# Patient Record
Sex: Female | Born: 1935 | Race: White | Hispanic: No | Smoking: Former smoker
Health system: Southern US, Community
[De-identification: ages and names within clinical notes are randomized; demographics above are authoritative.]

## PROBLEM LIST (undated history)

## (undated) DIAGNOSIS — I4891 Unspecified atrial fibrillation: Secondary | ICD-10-CM

## (undated) DIAGNOSIS — R42 Dizziness and giddiness: Secondary | ICD-10-CM

## (undated) DIAGNOSIS — R001 Bradycardia, unspecified: Secondary | ICD-10-CM

## (undated) DIAGNOSIS — E039 Hypothyroidism, unspecified: Secondary | ICD-10-CM

## (undated) DIAGNOSIS — K219 Gastro-esophageal reflux disease without esophagitis: Secondary | ICD-10-CM

## (undated) DIAGNOSIS — H3530 Unspecified macular degeneration: Secondary | ICD-10-CM

## (undated) DIAGNOSIS — M1990 Unspecified osteoarthritis, unspecified site: Secondary | ICD-10-CM

## (undated) DIAGNOSIS — K222 Esophageal obstruction: Secondary | ICD-10-CM

## (undated) DIAGNOSIS — D649 Anemia, unspecified: Secondary | ICD-10-CM

## (undated) DIAGNOSIS — N289 Disorder of kidney and ureter, unspecified: Secondary | ICD-10-CM

## (undated) DIAGNOSIS — G250 Essential tremor: Secondary | ICD-10-CM

## (undated) DIAGNOSIS — I10 Essential (primary) hypertension: Secondary | ICD-10-CM

## (undated) DIAGNOSIS — K5790 Diverticulosis of intestine, part unspecified, without perforation or abscess without bleeding: Secondary | ICD-10-CM

## (undated) DIAGNOSIS — I4892 Unspecified atrial flutter: Secondary | ICD-10-CM

## (undated) DIAGNOSIS — N393 Stress incontinence (female) (male): Secondary | ICD-10-CM

## (undated) HISTORY — DX: Bradycardia, unspecified: R00.1

## (undated) HISTORY — DX: Esophageal obstruction: K22.2

## (undated) HISTORY — DX: Unspecified atrial flutter: I48.92

## (undated) HISTORY — DX: Stress incontinence (female) (male): N39.3

## (undated) HISTORY — DX: Gastro-esophageal reflux disease without esophagitis: K21.9

## (undated) HISTORY — DX: Diverticulosis of intestine, part unspecified, without perforation or abscess without bleeding: K57.90

## (undated) HISTORY — DX: Unspecified atrial fibrillation: I48.91

## (undated) HISTORY — DX: Disorder of kidney and ureter, unspecified: N28.9

## (undated) HISTORY — DX: Essential (primary) hypertension: I10

## (undated) HISTORY — DX: Dizziness and giddiness: R42

## (undated) HISTORY — DX: Anemia, unspecified: D64.9

## (undated) HISTORY — DX: Unspecified osteoarthritis, unspecified site: M19.90

## (undated) HISTORY — DX: Hypothyroidism, unspecified: E03.9

## (undated) HISTORY — DX: Unspecified macular degeneration: H35.30

## (undated) HISTORY — PX: PACEMAKER INSERTION: SHX728

## (undated) HISTORY — PX: OTHER SURGICAL HISTORY: SHX169

## (undated) HISTORY — DX: Essential tremor: G25.0

---

## 1997-07-14 ENCOUNTER — Ambulatory Visit (HOSPITAL_COMMUNITY): Admission: RE | Admit: 1997-07-14 | Discharge: 1997-07-14 | Payer: Self-pay | Admitting: Obstetrics & Gynecology

## 1998-06-29 ENCOUNTER — Other Ambulatory Visit: Admission: RE | Admit: 1998-06-29 | Discharge: 1998-06-29 | Payer: Self-pay | Admitting: Internal Medicine

## 1999-04-18 ENCOUNTER — Encounter: Admission: RE | Admit: 1999-04-18 | Discharge: 1999-04-18 | Payer: Self-pay | Admitting: Internal Medicine

## 1999-04-18 ENCOUNTER — Encounter: Payer: Self-pay | Admitting: Internal Medicine

## 1999-05-10 ENCOUNTER — Encounter: Payer: Self-pay | Admitting: Internal Medicine

## 1999-05-10 ENCOUNTER — Inpatient Hospital Stay (HOSPITAL_COMMUNITY): Admission: EM | Admit: 1999-05-10 | Discharge: 1999-05-13 | Payer: Self-pay | Admitting: Emergency Medicine

## 1999-05-11 ENCOUNTER — Encounter: Payer: Self-pay | Admitting: Internal Medicine

## 1999-05-12 ENCOUNTER — Encounter: Payer: Self-pay | Admitting: Internal Medicine

## 1999-07-19 ENCOUNTER — Other Ambulatory Visit: Admission: RE | Admit: 1999-07-19 | Discharge: 1999-07-19 | Payer: Self-pay | Admitting: Internal Medicine

## 1999-07-29 ENCOUNTER — Ambulatory Visit (HOSPITAL_COMMUNITY): Admission: RE | Admit: 1999-07-29 | Discharge: 1999-07-29 | Payer: Self-pay | Admitting: Gastroenterology

## 2000-07-19 ENCOUNTER — Encounter: Payer: Self-pay | Admitting: Internal Medicine

## 2000-07-19 ENCOUNTER — Encounter: Admission: RE | Admit: 2000-07-19 | Discharge: 2000-07-19 | Payer: Self-pay | Admitting: Internal Medicine

## 2001-08-09 ENCOUNTER — Encounter: Payer: Self-pay | Admitting: Internal Medicine

## 2001-08-09 ENCOUNTER — Encounter: Admission: RE | Admit: 2001-08-09 | Discharge: 2001-08-09 | Payer: Self-pay | Admitting: Internal Medicine

## 2001-10-25 ENCOUNTER — Ambulatory Visit (HOSPITAL_COMMUNITY): Admission: RE | Admit: 2001-10-25 | Discharge: 2001-10-25 | Payer: Self-pay | Admitting: Internal Medicine

## 2001-10-28 ENCOUNTER — Encounter: Payer: Self-pay | Admitting: Internal Medicine

## 2001-10-28 ENCOUNTER — Encounter: Admission: RE | Admit: 2001-10-28 | Discharge: 2001-10-28 | Payer: Self-pay | Admitting: Internal Medicine

## 2002-08-11 ENCOUNTER — Encounter: Admission: RE | Admit: 2002-08-11 | Discharge: 2002-08-11 | Payer: Self-pay | Admitting: Internal Medicine

## 2002-08-11 ENCOUNTER — Encounter: Payer: Self-pay | Admitting: Internal Medicine

## 2003-01-09 ENCOUNTER — Other Ambulatory Visit: Admission: RE | Admit: 2003-01-09 | Discharge: 2003-01-09 | Payer: Self-pay | Admitting: Internal Medicine

## 2003-08-21 ENCOUNTER — Encounter: Admission: RE | Admit: 2003-08-21 | Discharge: 2003-08-21 | Payer: Self-pay | Admitting: Internal Medicine

## 2003-12-21 ENCOUNTER — Inpatient Hospital Stay (HOSPITAL_COMMUNITY): Admission: AD | Admit: 2003-12-21 | Discharge: 2003-12-23 | Payer: Self-pay | Admitting: Internal Medicine

## 2003-12-22 ENCOUNTER — Encounter: Payer: Self-pay | Admitting: Cardiology

## 2004-01-15 ENCOUNTER — Inpatient Hospital Stay (HOSPITAL_COMMUNITY): Admission: AD | Admit: 2004-01-15 | Discharge: 2004-01-22 | Payer: Self-pay | Admitting: Internal Medicine

## 2004-02-04 ENCOUNTER — Ambulatory Visit: Payer: Self-pay

## 2004-02-09 ENCOUNTER — Ambulatory Visit: Payer: Self-pay | Admitting: Cardiology

## 2004-02-15 ENCOUNTER — Ambulatory Visit: Payer: Self-pay | Admitting: Cardiology

## 2004-03-24 ENCOUNTER — Ambulatory Visit: Payer: Self-pay | Admitting: Cardiology

## 2004-04-15 ENCOUNTER — Encounter: Admission: RE | Admit: 2004-04-15 | Discharge: 2004-04-15 | Payer: Self-pay | Admitting: Internal Medicine

## 2004-04-21 ENCOUNTER — Ambulatory Visit: Payer: Self-pay | Admitting: Cardiology

## 2004-05-20 ENCOUNTER — Ambulatory Visit: Payer: Self-pay | Admitting: Cardiology

## 2004-05-31 ENCOUNTER — Ambulatory Visit: Payer: Self-pay | Admitting: Cardiology

## 2004-06-14 ENCOUNTER — Ambulatory Visit: Payer: Self-pay | Admitting: Internal Medicine

## 2004-06-14 ENCOUNTER — Ambulatory Visit: Payer: Self-pay

## 2004-06-23 ENCOUNTER — Ambulatory Visit: Payer: Self-pay | Admitting: Cardiology

## 2004-06-29 ENCOUNTER — Ambulatory Visit: Payer: Self-pay | Admitting: Cardiovascular Disease

## 2004-06-29 ENCOUNTER — Inpatient Hospital Stay (HOSPITAL_COMMUNITY): Admission: EM | Admit: 2004-06-29 | Discharge: 2004-07-03 | Payer: Self-pay | Admitting: Emergency Medicine

## 2004-06-29 ENCOUNTER — Encounter: Payer: Self-pay | Admitting: Cardiology

## 2004-07-05 ENCOUNTER — Ambulatory Visit: Payer: Self-pay | Admitting: Cardiology

## 2004-07-13 ENCOUNTER — Ambulatory Visit: Payer: Self-pay | Admitting: Internal Medicine

## 2004-08-31 ENCOUNTER — Ambulatory Visit: Payer: Self-pay | Admitting: Cardiology

## 2004-08-31 ENCOUNTER — Ambulatory Visit: Payer: Self-pay | Admitting: Internal Medicine

## 2004-09-01 ENCOUNTER — Ambulatory Visit: Payer: Self-pay | Admitting: Internal Medicine

## 2004-09-05 ENCOUNTER — Encounter: Admission: RE | Admit: 2004-09-05 | Discharge: 2004-09-05 | Payer: Self-pay | Admitting: Internal Medicine

## 2004-09-06 ENCOUNTER — Ambulatory Visit: Payer: Self-pay | Admitting: Cardiology

## 2004-09-07 ENCOUNTER — Ambulatory Visit: Admission: RE | Admit: 2004-09-07 | Discharge: 2004-09-07 | Payer: Self-pay | Admitting: Cardiology

## 2004-09-14 ENCOUNTER — Ambulatory Visit: Payer: Self-pay | Admitting: *Deleted

## 2004-10-03 ENCOUNTER — Ambulatory Visit: Payer: Self-pay | Admitting: Cardiology

## 2004-10-12 ENCOUNTER — Ambulatory Visit: Payer: Self-pay | Admitting: Cardiology

## 2004-10-26 ENCOUNTER — Ambulatory Visit: Payer: Self-pay | Admitting: Cardiology

## 2004-11-08 ENCOUNTER — Ambulatory Visit: Payer: Self-pay | Admitting: Cardiology

## 2004-12-06 ENCOUNTER — Ambulatory Visit: Payer: Self-pay | Admitting: Cardiology

## 2005-01-03 ENCOUNTER — Ambulatory Visit: Payer: Self-pay | Admitting: Internal Medicine

## 2005-01-17 ENCOUNTER — Ambulatory Visit: Payer: Self-pay | Admitting: Cardiology

## 2005-01-31 ENCOUNTER — Ambulatory Visit: Payer: Self-pay | Admitting: Cardiology

## 2005-02-17 ENCOUNTER — Ambulatory Visit: Payer: Self-pay | Admitting: Cardiology

## 2005-02-17 ENCOUNTER — Ambulatory Visit: Payer: Self-pay | Admitting: Internal Medicine

## 2005-03-06 ENCOUNTER — Ambulatory Visit: Payer: Self-pay | Admitting: Cardiology

## 2005-03-07 ENCOUNTER — Encounter: Admission: RE | Admit: 2005-03-07 | Discharge: 2005-03-07 | Payer: Self-pay | Admitting: Internal Medicine

## 2005-04-20 IMAGING — CR DG CHEST 1V PORT
1 series · 1 of 1 positions shown · non-contrast
Comparison: none

CLINICAL DATA: Followup pneumothorax following left chest tube placement.  Previous pacemaker placement. 
PORTABLE CHEST ([DATE] HOURS)
Compared to a prior study at [DATE] hours, there has been placement of a left-sided chest tube.  A small residual approximately 10 percent left apical pneumothorax is seen which has significantly decreased in size since prior study. 
Lungs otherwise grossly clear.  Heart size and mediastinal contours are within normal limits.  Dual lead transvenous pacemaker remains in appropriate position. 
IMPRESSION
Small approximately 10 percent left apical pneumothorax remaining after left chest tube placement.

[view not recorded]
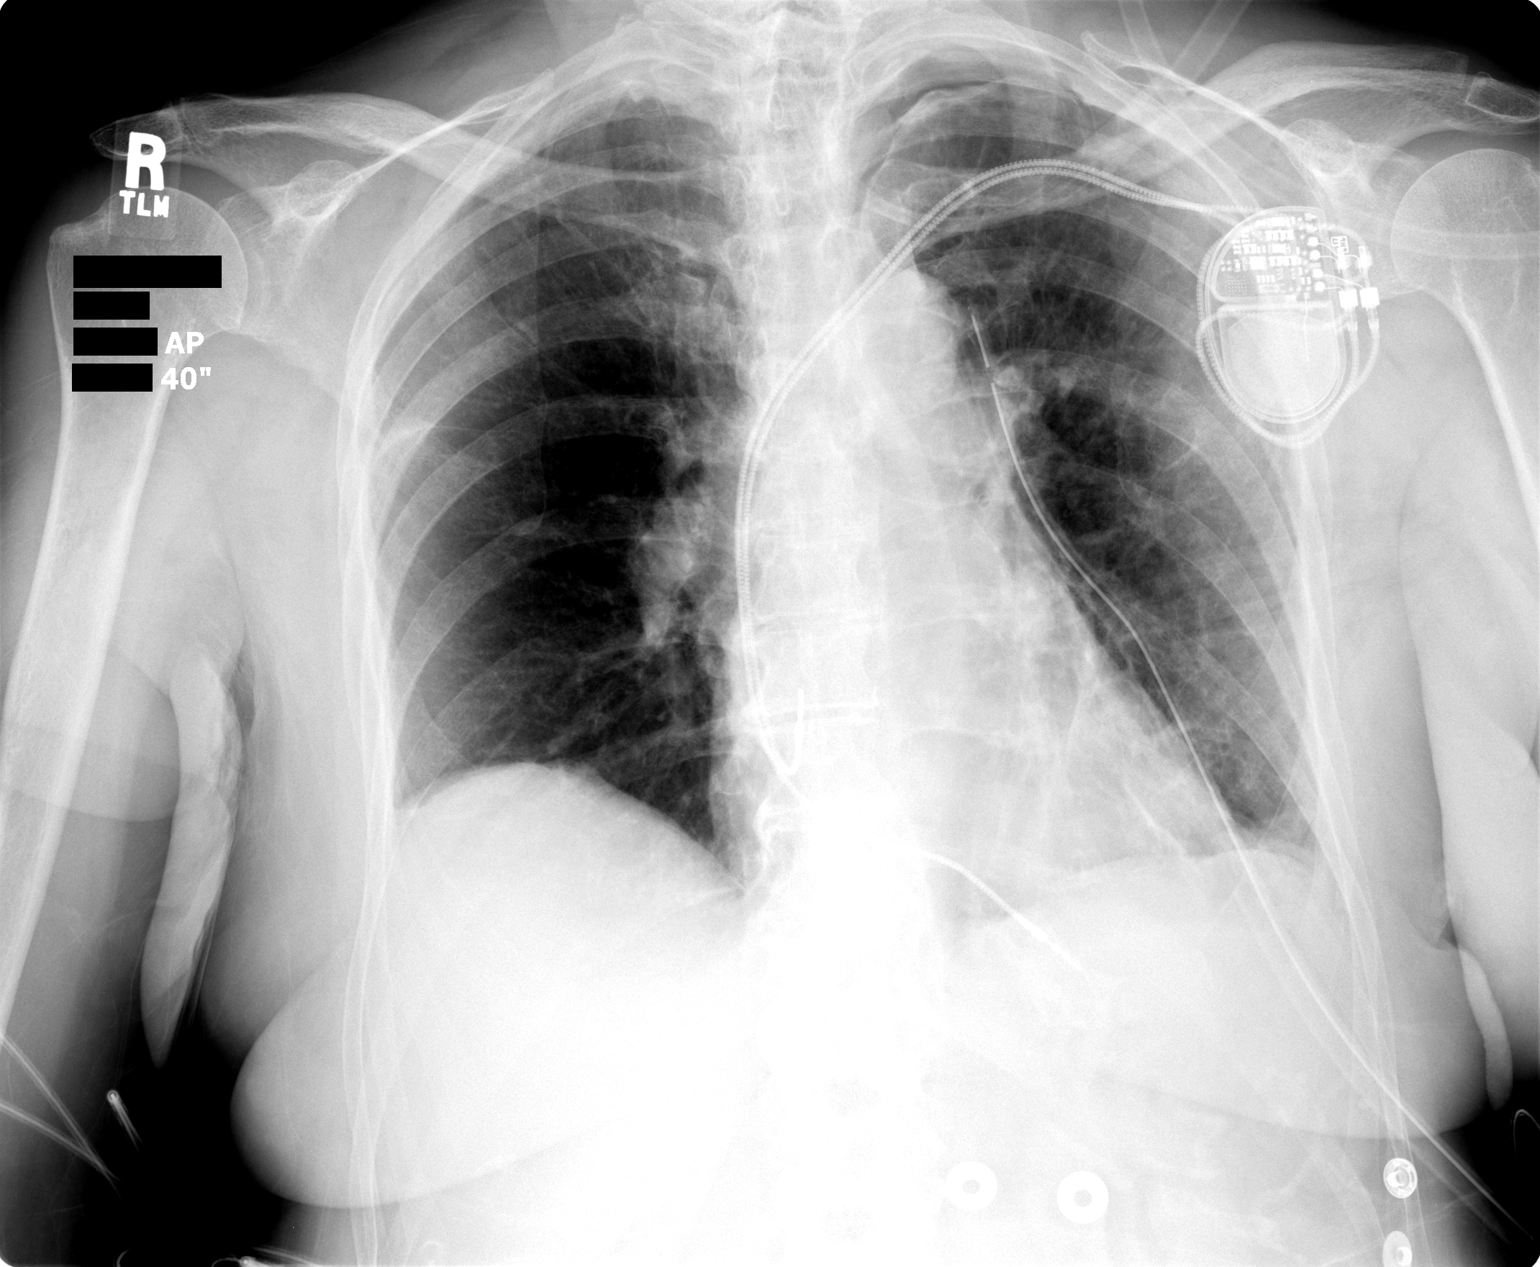

[1 of 1 positions shown; findings below may reference images not displayed]

## 2005-04-21 ENCOUNTER — Ambulatory Visit: Payer: Self-pay | Admitting: Cardiology

## 2005-05-12 ENCOUNTER — Ambulatory Visit: Payer: Self-pay | Admitting: Internal Medicine

## 2005-06-09 ENCOUNTER — Ambulatory Visit: Payer: Self-pay | Admitting: Internal Medicine

## 2005-07-06 ENCOUNTER — Ambulatory Visit: Payer: Self-pay | Admitting: Cardiology

## 2005-08-03 ENCOUNTER — Ambulatory Visit: Payer: Self-pay | Admitting: Cardiology

## 2005-08-31 ENCOUNTER — Ambulatory Visit: Payer: Self-pay | Admitting: Cardiology

## 2005-09-15 ENCOUNTER — Encounter: Admission: RE | Admit: 2005-09-15 | Discharge: 2005-09-15 | Payer: Self-pay | Admitting: Internal Medicine

## 2005-09-28 ENCOUNTER — Ambulatory Visit: Payer: Self-pay | Admitting: Internal Medicine

## 2005-09-28 ENCOUNTER — Other Ambulatory Visit: Admission: RE | Admit: 2005-09-28 | Discharge: 2005-09-28 | Payer: Self-pay | Admitting: Internal Medicine

## 2005-10-19 ENCOUNTER — Ambulatory Visit: Payer: Self-pay | Admitting: Cardiology

## 2005-10-31 ENCOUNTER — Ambulatory Visit: Payer: Self-pay | Admitting: Cardiology

## 2005-11-14 ENCOUNTER — Ambulatory Visit: Payer: Self-pay | Admitting: Cardiology

## 2005-11-28 ENCOUNTER — Ambulatory Visit: Payer: Self-pay | Admitting: *Deleted

## 2005-12-15 ENCOUNTER — Ambulatory Visit: Payer: Self-pay | Admitting: Cardiology

## 2006-01-05 ENCOUNTER — Ambulatory Visit: Payer: Self-pay | Admitting: Cardiovascular Disease

## 2006-02-02 ENCOUNTER — Ambulatory Visit: Payer: Self-pay | Admitting: Cardiology

## 2006-03-02 ENCOUNTER — Ambulatory Visit: Payer: Self-pay | Admitting: Physician Assistant

## 2006-03-12 ENCOUNTER — Ambulatory Visit: Payer: Self-pay | Admitting: Cardiology

## 2006-03-16 ENCOUNTER — Ambulatory Visit: Admission: RE | Admit: 2006-03-16 | Discharge: 2006-03-16 | Payer: Self-pay | Admitting: Cardiology

## 2006-03-30 ENCOUNTER — Ambulatory Visit: Payer: Self-pay | Admitting: Cardiology

## 2006-04-26 ENCOUNTER — Ambulatory Visit: Payer: Self-pay | Admitting: Cardiology

## 2006-05-14 ENCOUNTER — Ambulatory Visit: Payer: Self-pay

## 2006-05-18 ENCOUNTER — Ambulatory Visit: Payer: Self-pay | Admitting: Cardiology

## 2006-06-14 ENCOUNTER — Ambulatory Visit: Payer: Self-pay | Admitting: Cardiology

## 2006-06-22 ENCOUNTER — Encounter: Admission: RE | Admit: 2006-06-22 | Discharge: 2006-06-22 | Payer: Self-pay | Admitting: Internal Medicine

## 2006-07-12 ENCOUNTER — Ambulatory Visit: Payer: Self-pay | Admitting: Cardiology

## 2006-08-02 ENCOUNTER — Ambulatory Visit: Payer: Self-pay | Admitting: Cardiovascular Disease

## 2006-08-08 ENCOUNTER — Ambulatory Visit: Payer: Self-pay | Admitting: Internal Medicine

## 2006-10-08 ENCOUNTER — Ambulatory Visit: Payer: Self-pay | Admitting: Cardiovascular Disease

## 2006-10-16 ENCOUNTER — Ambulatory Visit: Payer: Self-pay | Admitting: Internal Medicine

## 2006-10-24 ENCOUNTER — Encounter: Admission: RE | Admit: 2006-10-24 | Discharge: 2006-10-24 | Payer: Self-pay | Admitting: Internal Medicine

## 2006-11-05 ENCOUNTER — Ambulatory Visit: Payer: Self-pay | Admitting: Internal Medicine

## 2006-12-07 ENCOUNTER — Ambulatory Visit: Payer: Self-pay | Admitting: Internal Medicine

## 2007-01-04 ENCOUNTER — Ambulatory Visit: Payer: Self-pay | Admitting: Internal Medicine

## 2007-01-16 ENCOUNTER — Ambulatory Visit: Payer: Self-pay | Admitting: Internal Medicine

## 2007-01-31 ENCOUNTER — Ambulatory Visit: Payer: Self-pay | Admitting: Internal Medicine

## 2007-02-21 ENCOUNTER — Ambulatory Visit: Payer: Self-pay | Admitting: Cardiology

## 2007-03-07 ENCOUNTER — Ambulatory Visit: Payer: Self-pay | Admitting: Cardiology

## 2007-03-07 ENCOUNTER — Ambulatory Visit: Payer: Self-pay | Admitting: Cardiovascular Disease

## 2007-03-07 LAB — CONVERTED CEMR LAB
AST: 24 units/L (ref 0–37)
Albumin: 3.5 g/dL (ref 3.5–5.2)
Alkaline Phosphatase: 62 units/L (ref 39–117)
BUN: 28 mg/dL — ABNORMAL HIGH (ref 6–23)
CO2: 26 meq/L (ref 19–32)
Chloride: 105 meq/L (ref 96–112)
Creatinine, Ser: 2.2 mg/dL — ABNORMAL HIGH (ref 0.4–1.2)
Prothrombin Time: 21.6 s — ABNORMAL HIGH (ref 10.9–13.3)
Sodium: 138 meq/L (ref 135–145)
Total Bilirubin: 0.6 mg/dL (ref 0.3–1.2)
Total Protein: 6.2 g/dL (ref 6.0–8.3)

## 2007-03-12 ENCOUNTER — Ambulatory Visit: Payer: Self-pay | Admitting: Internal Medicine

## 2007-03-15 ENCOUNTER — Ambulatory Visit: Payer: Self-pay | Admitting: Cardiology

## 2007-03-26 ENCOUNTER — Ambulatory Visit: Payer: Self-pay | Admitting: Cardiology

## 2007-04-17 ENCOUNTER — Ambulatory Visit: Payer: Self-pay | Admitting: Internal Medicine

## 2007-04-29 ENCOUNTER — Ambulatory Visit: Payer: Self-pay | Admitting: Cardiology

## 2007-05-27 ENCOUNTER — Ambulatory Visit: Payer: Self-pay | Admitting: Cardiology

## 2007-06-25 ENCOUNTER — Ambulatory Visit: Payer: Self-pay | Admitting: Cardiology

## 2007-07-15 ENCOUNTER — Ambulatory Visit: Payer: Self-pay | Admitting: Cardiology

## 2007-07-16 ENCOUNTER — Ambulatory Visit: Payer: Self-pay | Admitting: Cardiology

## 2007-07-16 LAB — CONVERTED CEMR LAB: INR: 6.8 (ref 0.8–1.0)

## 2007-07-17 ENCOUNTER — Ambulatory Visit: Payer: Self-pay | Admitting: Internal Medicine

## 2007-07-18 ENCOUNTER — Ambulatory Visit: Payer: Self-pay | Admitting: Cardiology

## 2007-07-22 ENCOUNTER — Ambulatory Visit: Payer: Self-pay | Admitting: Cardiology

## 2007-07-29 ENCOUNTER — Ambulatory Visit: Payer: Self-pay | Admitting: Cardiovascular Disease

## 2007-08-05 ENCOUNTER — Ambulatory Visit: Payer: Self-pay | Admitting: Internal Medicine

## 2007-08-22 ENCOUNTER — Ambulatory Visit: Payer: Self-pay | Admitting: Internal Medicine

## 2007-10-11 ENCOUNTER — Ambulatory Visit: Payer: Self-pay | Admitting: Cardiovascular Disease

## 2007-10-25 ENCOUNTER — Ambulatory Visit: Payer: Self-pay | Admitting: Cardiology

## 2007-10-28 ENCOUNTER — Ambulatory Visit: Payer: Self-pay | Admitting: Cardiology

## 2007-10-28 LAB — CONVERTED CEMR LAB
AST: 18 units/L (ref 0–37)
Alkaline Phosphatase: 61 units/L (ref 39–117)
Total Bilirubin: 0.9 mg/dL (ref 0.3–1.2)

## 2007-10-29 ENCOUNTER — Encounter: Admission: RE | Admit: 2007-10-29 | Discharge: 2007-10-29 | Payer: Self-pay | Admitting: Internal Medicine

## 2007-11-15 ENCOUNTER — Ambulatory Visit: Payer: Self-pay | Admitting: Cardiovascular Disease

## 2007-11-26 ENCOUNTER — Ambulatory Visit: Payer: Self-pay | Admitting: Internal Medicine

## 2007-11-29 ENCOUNTER — Ambulatory Visit: Payer: Self-pay | Admitting: Cardiology

## 2007-12-20 ENCOUNTER — Ambulatory Visit: Payer: Self-pay | Admitting: Cardiology

## 2007-12-26 ENCOUNTER — Encounter: Payer: Self-pay | Admitting: Cardiology

## 2008-01-17 ENCOUNTER — Ambulatory Visit: Payer: Self-pay | Admitting: Cardiology

## 2008-02-14 ENCOUNTER — Ambulatory Visit: Payer: Self-pay | Admitting: Internal Medicine

## 2008-02-17 ENCOUNTER — Ambulatory Visit: Payer: Self-pay | Admitting: Internal Medicine

## 2008-02-26 ENCOUNTER — Ambulatory Visit: Payer: Self-pay | Admitting: Internal Medicine

## 2008-03-16 ENCOUNTER — Ambulatory Visit: Payer: Self-pay | Admitting: Cardiology

## 2008-04-13 ENCOUNTER — Ambulatory Visit: Payer: Self-pay | Admitting: Cardiology

## 2008-05-14 ENCOUNTER — Ambulatory Visit: Payer: Self-pay | Admitting: Cardiovascular Disease

## 2008-05-27 ENCOUNTER — Ambulatory Visit: Payer: Self-pay | Admitting: Internal Medicine

## 2008-06-05 ENCOUNTER — Ambulatory Visit: Payer: Self-pay | Admitting: Internal Medicine

## 2008-06-26 ENCOUNTER — Ambulatory Visit: Payer: Self-pay | Admitting: Internal Medicine

## 2008-06-29 ENCOUNTER — Ambulatory Visit: Payer: Self-pay | Admitting: Cardiology

## 2008-07-24 ENCOUNTER — Encounter (INDEPENDENT_AMBULATORY_CARE_PROVIDER_SITE_OTHER): Payer: Self-pay | Admitting: Radiology

## 2008-07-27 ENCOUNTER — Ambulatory Visit: Payer: Self-pay | Admitting: Cardiology

## 2008-08-28 ENCOUNTER — Encounter: Payer: Self-pay | Admitting: Internal Medicine

## 2008-08-28 LAB — CONVERTED CEMR LAB
INR: 2.76
Protime: 20.5

## 2008-09-01 ENCOUNTER — Encounter: Payer: Self-pay | Admitting: *Deleted

## 2008-09-02 ENCOUNTER — Telehealth (INDEPENDENT_AMBULATORY_CARE_PROVIDER_SITE_OTHER): Payer: Self-pay | Admitting: *Deleted

## 2008-09-03 ENCOUNTER — Encounter: Payer: Self-pay | Admitting: Internal Medicine

## 2008-09-04 ENCOUNTER — Ambulatory Visit: Payer: Self-pay | Admitting: Internal Medicine

## 2008-09-17 DIAGNOSIS — K219 Gastro-esophageal reflux disease without esophagitis: Secondary | ICD-10-CM

## 2008-09-17 DIAGNOSIS — J45909 Unspecified asthma, uncomplicated: Secondary | ICD-10-CM | POA: Insufficient documentation

## 2008-09-17 DIAGNOSIS — I4892 Unspecified atrial flutter: Secondary | ICD-10-CM

## 2008-09-17 DIAGNOSIS — I10 Essential (primary) hypertension: Secondary | ICD-10-CM

## 2008-09-18 ENCOUNTER — Encounter: Payer: Self-pay | Admitting: Internal Medicine

## 2008-09-22 ENCOUNTER — Ambulatory Visit: Payer: Self-pay | Admitting: Cardiology

## 2008-09-22 ENCOUNTER — Encounter (INDEPENDENT_AMBULATORY_CARE_PROVIDER_SITE_OTHER): Payer: Self-pay | Admitting: Pharmacist

## 2008-09-22 LAB — CONVERTED CEMR LAB
POC INR: 1.4
Prothrombin Time: 14.4 s

## 2008-10-06 ENCOUNTER — Encounter (INDEPENDENT_AMBULATORY_CARE_PROVIDER_SITE_OTHER): Payer: Self-pay | Admitting: Cardiology

## 2008-10-06 ENCOUNTER — Ambulatory Visit: Payer: Self-pay | Admitting: Cardiology

## 2008-10-06 LAB — CONVERTED CEMR LAB
POC INR: 2.7
Prothrombin Time: 19.9 s

## 2008-10-07 ENCOUNTER — Encounter: Payer: Self-pay | Admitting: *Deleted

## 2008-10-12 ENCOUNTER — Ambulatory Visit: Payer: Self-pay | Admitting: Internal Medicine

## 2008-10-27 ENCOUNTER — Ambulatory Visit: Payer: Self-pay | Admitting: Internal Medicine

## 2008-10-29 ENCOUNTER — Encounter: Admission: RE | Admit: 2008-10-29 | Discharge: 2008-10-29 | Payer: Self-pay | Admitting: Internal Medicine

## 2008-11-04 ENCOUNTER — Ambulatory Visit: Payer: Self-pay | Admitting: Internal Medicine

## 2008-11-09 ENCOUNTER — Ambulatory Visit: Payer: Self-pay | Admitting: Internal Medicine

## 2008-11-10 ENCOUNTER — Encounter: Admission: RE | Admit: 2008-11-10 | Discharge: 2008-11-10 | Payer: Self-pay | Admitting: Internal Medicine

## 2008-11-10 ENCOUNTER — Ambulatory Visit: Payer: Self-pay | Admitting: Internal Medicine

## 2008-11-24 ENCOUNTER — Encounter (INDEPENDENT_AMBULATORY_CARE_PROVIDER_SITE_OTHER): Payer: Self-pay | Admitting: Cardiology

## 2008-11-24 ENCOUNTER — Ambulatory Visit: Payer: Self-pay | Admitting: Internal Medicine

## 2008-12-15 ENCOUNTER — Ambulatory Visit: Payer: Self-pay | Admitting: Internal Medicine

## 2008-12-15 DIAGNOSIS — Z950 Presence of cardiac pacemaker: Secondary | ICD-10-CM

## 2008-12-15 DIAGNOSIS — I498 Other specified cardiac arrhythmias: Secondary | ICD-10-CM

## 2008-12-30 ENCOUNTER — Ambulatory Visit: Payer: Self-pay | Admitting: Cardiovascular Disease

## 2009-01-08 ENCOUNTER — Ambulatory Visit: Payer: Self-pay | Admitting: Internal Medicine

## 2009-01-08 LAB — CONVERTED CEMR LAB: POC INR: 2.7

## 2009-01-14 ENCOUNTER — Ambulatory Visit: Payer: Self-pay | Admitting: Internal Medicine

## 2009-01-14 ENCOUNTER — Ambulatory Visit: Payer: Self-pay | Admitting: Cardiology

## 2009-01-14 LAB — CONVERTED CEMR LAB: POC INR: 3.2

## 2009-02-02 ENCOUNTER — Encounter: Payer: Self-pay | Admitting: Internal Medicine

## 2009-02-02 ENCOUNTER — Encounter: Payer: Self-pay | Admitting: *Deleted

## 2009-02-02 LAB — CONVERTED CEMR LAB
POC INR: 3.11
Prothrombin Time: 21.9 s

## 2009-02-24 ENCOUNTER — Encounter: Payer: Self-pay | Admitting: Internal Medicine

## 2009-02-24 ENCOUNTER — Encounter (INDEPENDENT_AMBULATORY_CARE_PROVIDER_SITE_OTHER): Payer: Self-pay | Admitting: Pharmacist

## 2009-02-24 LAB — CONVERTED CEMR LAB: POC INR: 2.64

## 2009-03-17 ENCOUNTER — Encounter: Payer: Self-pay | Admitting: Cardiology

## 2009-03-17 ENCOUNTER — Encounter (INDEPENDENT_AMBULATORY_CARE_PROVIDER_SITE_OTHER): Payer: Self-pay | Admitting: Cardiology

## 2009-03-18 ENCOUNTER — Encounter: Payer: Self-pay | Admitting: Internal Medicine

## 2009-03-23 ENCOUNTER — Ambulatory Visit: Payer: Self-pay | Admitting: Internal Medicine

## 2009-03-23 ENCOUNTER — Encounter: Payer: Self-pay | Admitting: Internal Medicine

## 2009-04-06 ENCOUNTER — Encounter: Payer: Self-pay | Admitting: Internal Medicine

## 2009-04-16 ENCOUNTER — Ambulatory Visit: Payer: Self-pay | Admitting: Internal Medicine

## 2009-04-21 ENCOUNTER — Ambulatory Visit: Payer: Self-pay | Admitting: Cardiovascular Disease

## 2009-04-21 LAB — CONVERTED CEMR LAB: POC INR: 2

## 2009-05-19 ENCOUNTER — Ambulatory Visit: Payer: Self-pay | Admitting: Internal Medicine

## 2009-06-04 ENCOUNTER — Ambulatory Visit: Payer: Self-pay | Admitting: Cardiology

## 2009-06-18 ENCOUNTER — Ambulatory Visit: Payer: Self-pay | Admitting: Cardiovascular Disease

## 2009-06-18 LAB — CONVERTED CEMR LAB: POC INR: 3.4

## 2009-06-21 ENCOUNTER — Ambulatory Visit: Payer: Self-pay | Admitting: Internal Medicine

## 2009-06-23 ENCOUNTER — Encounter: Payer: Self-pay | Admitting: Internal Medicine

## 2009-06-23 ENCOUNTER — Ambulatory Visit: Payer: Self-pay | Admitting: Internal Medicine

## 2009-07-02 ENCOUNTER — Ambulatory Visit: Payer: Self-pay | Admitting: Internal Medicine

## 2009-07-02 LAB — CONVERTED CEMR LAB: POC INR: 3.1

## 2009-07-12 ENCOUNTER — Encounter: Payer: Self-pay | Admitting: Internal Medicine

## 2009-07-19 ENCOUNTER — Ambulatory Visit: Payer: Self-pay | Admitting: Cardiology

## 2009-08-16 ENCOUNTER — Ambulatory Visit: Payer: Self-pay | Admitting: Cardiovascular Disease

## 2009-08-16 LAB — CONVERTED CEMR LAB: POC INR: 3.5

## 2009-09-01 ENCOUNTER — Ambulatory Visit: Payer: Self-pay | Admitting: Cardiology

## 2009-09-01 LAB — CONVERTED CEMR LAB: POC INR: 2.3

## 2009-09-21 ENCOUNTER — Encounter: Payer: Self-pay | Admitting: Internal Medicine

## 2009-09-23 ENCOUNTER — Encounter: Payer: Self-pay | Admitting: Cardiology

## 2009-10-06 ENCOUNTER — Ambulatory Visit: Payer: Self-pay | Admitting: Internal Medicine

## 2009-10-07 ENCOUNTER — Ambulatory Visit: Payer: Self-pay | Admitting: Cardiology

## 2009-10-12 ENCOUNTER — Encounter: Payer: Self-pay | Admitting: Internal Medicine

## 2009-10-15 ENCOUNTER — Ambulatory Visit: Payer: Self-pay | Admitting: Internal Medicine

## 2009-10-15 ENCOUNTER — Encounter: Payer: Self-pay | Admitting: Cardiology

## 2009-10-19 ENCOUNTER — Ambulatory Visit: Payer: Self-pay | Admitting: Internal Medicine

## 2009-10-21 ENCOUNTER — Ambulatory Visit: Payer: Self-pay | Admitting: Cardiology

## 2009-10-21 DIAGNOSIS — I482 Chronic atrial fibrillation, unspecified: Secondary | ICD-10-CM

## 2009-10-27 LAB — CONVERTED CEMR LAB
ALT: 13 units/L (ref 0–35)
AST: 18 units/L (ref 0–37)
TSH: 1.04 microintl units/mL (ref 0.35–5.50)
Total Bilirubin: 0.6 mg/dL (ref 0.3–1.2)

## 2009-11-01 ENCOUNTER — Ambulatory Visit: Payer: Self-pay | Admitting: Cardiovascular Disease

## 2009-11-01 ENCOUNTER — Encounter: Admission: RE | Admit: 2009-11-01 | Discharge: 2009-11-01 | Payer: Self-pay | Admitting: Internal Medicine

## 2009-11-29 ENCOUNTER — Ambulatory Visit: Payer: Self-pay | Admitting: Cardiology

## 2009-12-27 ENCOUNTER — Ambulatory Visit: Payer: Self-pay | Admitting: Internal Medicine

## 2009-12-30 ENCOUNTER — Encounter: Admission: RE | Admit: 2009-12-30 | Discharge: 2009-12-30 | Payer: Self-pay | Admitting: Internal Medicine

## 2009-12-30 ENCOUNTER — Ambulatory Visit: Payer: Self-pay | Admitting: Cardiology

## 2010-01-14 ENCOUNTER — Ambulatory Visit: Payer: Self-pay | Admitting: Cardiology

## 2010-01-14 LAB — CONVERTED CEMR LAB: POC INR: 2

## 2010-02-11 ENCOUNTER — Encounter: Payer: Self-pay | Admitting: Cardiology

## 2010-02-11 LAB — CONVERTED CEMR LAB: POC INR: 2.2

## 2010-03-10 ENCOUNTER — Encounter: Payer: Self-pay | Admitting: Internal Medicine

## 2010-03-10 LAB — CONVERTED CEMR LAB: Prothrombin Time: 19 s

## 2010-03-11 ENCOUNTER — Encounter: Payer: Self-pay | Admitting: Internal Medicine

## 2010-03-24 ENCOUNTER — Ambulatory Visit: Payer: Self-pay | Admitting: Cardiology

## 2010-03-24 LAB — CONVERTED CEMR LAB: POC INR: 2.2

## 2010-04-06 ENCOUNTER — Ambulatory Visit
Admission: RE | Admit: 2010-04-06 | Discharge: 2010-04-06 | Payer: Self-pay | Source: Home / Self Care | Attending: Internal Medicine | Admitting: Internal Medicine

## 2010-04-06 ENCOUNTER — Encounter: Payer: Self-pay | Admitting: Internal Medicine

## 2010-04-21 ENCOUNTER — Ambulatory Visit
Admission: RE | Admit: 2010-04-21 | Discharge: 2010-04-21 | Payer: Self-pay | Source: Home / Self Care | Attending: Internal Medicine | Admitting: Internal Medicine

## 2010-04-21 ENCOUNTER — Ambulatory Visit: Admission: RE | Admit: 2010-04-21 | Discharge: 2010-04-21 | Payer: Self-pay | Source: Home / Self Care

## 2010-04-25 ENCOUNTER — Encounter: Payer: Self-pay | Admitting: Internal Medicine

## 2010-05-05 NOTE — Cardiovascular Report (Signed)
Summary: Office Visit   Office Visit   Imported By: Roderic Ovens 04/08/2010 12:35:34  _____________________________________________________________________  External Attachment:    Type:   Image     Comment:   External Document

## 2010-05-05 NOTE — Cardiovascular Report (Signed)
Summary: Office Visit Remote  Office Visit Remote   Imported By: Roderic Ovens 07/13/2009 12:38:46  _____________________________________________________________________  External Attachment:    Type:   Image     Comment:   External Document

## 2010-05-05 NOTE — Letter (Signed)
Summary: Remote Device Check  [employer], Main Office  [address]   [place], [place] [address]   Phone: [phone]  Fax: [phone]     July 12, 2009 MRN: [id number]   [name] [address] [place], [place]  [address]   Dear [name],   Your remote transmission was recieved and reviewed by your physician.  All diagnostics were within normal limits for you.  __X___Your next transmission is scheduled for:  September 29, 2009.  Please transmit at any time this day.  If you have a wireless device your transmission will be sent automatically.      Sincerely,  Proofreader

## 2010-05-05 NOTE — Medication Information (Signed)
Summary: [facility]  Anticoagulant Therapy  Managed by: Bethena Midget, RN, BSN Referring MD: Rollene Rotunda MD PCP: Sharlet Salina, MD Supervising MD: Shirlee Latch MD, Albie Bazin Indication 1: Atrial Fibrillation (ICD-427.31) Lab Used: [facility] Site: [facility] PT 20.2 INR POC 2.7 INR RANGE 2.0-3.0  Dietary changes: no    Health status changes: no    Bleeding/hemorrhagic complications: no    Recent/future hospitalizations: no    Any changes in medication regimen? no    Recent/future dental: no  Any missed doses?: no       Is patient compliant with meds? yes      Comments: Lab received from 09/21/09  Allergies: No Known Drug Allergies  Anticoagulation Management History:      Her anticoagulation is being managed by telephone today.  Positive risk factors for bleeding include an age of 75 years or older and presence of serious comorbidities.  The bleeding index is 'intermediate risk'.  Positive CHADS2 values include History of HTN and Age > 73 years old.  The start date was 09/17/2002.  Her last INR was 2.76.  Prothrombin time is 20.2.  Anticoagulation responsible provider: Shirlee Latch MD, [name].  INR POC: 2.7.    Anticoagulation Management Assessment/Plan:      The patient's current anticoagulation dose is Warfarin sodium 4 mg tabs: Use as directed by Anticoagulation Clinic.  The target INR is 2.0-3.0.  The next INR is due 10/21/2009.  Anticoagulation instructions were given to patient.  Results were reviewed/authorized by Bethena Midget, RN, BSN.  She was notified by Bethena Midget, RN, BSN.         Prior Anticoagulation Instructions: INR-2.3 Resume normal dosing schedule.Take 1 tablet on Monday, Wednesday and Friday and take half a tablet on all other days. Patient on vacation in [place] form early June to July will have INR checked in [place] on 09/22/09, issued her with paperwork.  Current Anticoagulation Instructions: INR 2.7 Continue 2mg s everyday except 4mg s on Mondays,  Wednesdays and Fridays. Recheck in 4 weeks. Pt will be back in [place], appt s/c at office.

## 2010-05-05 NOTE — Medication Information (Signed)
Summary: cvrr   rov     js  Anticoagulant Therapy  Managed by: Cloyde Reams, RN, BSN Referring MD: Rollene Rotunda MD PCP: Sharlet Salina, MD Supervising MD: Clifton James MD, Cristal Deer Indication 1: Atrial Fibrillation (ICD-427.31) Lab Used: [facility] Site: [facility] INR POC 3.4 INR RANGE 2.0-3.0  Dietary changes: no    Health status changes: no    Bleeding/hemorrhagic complications: no    Recent/future hospitalizations: no    Any changes in medication regimen? no    Recent/future dental: no  Any missed doses?: no       Is patient compliant with meds? yes       Allergies (verified): No Known Drug Allergies  Anticoagulation Management History:      The patient is taking warfarin and comes in today for a routine follow up visit.  Positive risk factors for bleeding include an age of 4 years or older and presence of serious comorbidities.  The bleeding index is 'intermediate risk'.  Positive CHADS2 values include History of HTN and Age > 39 years old.  The start date was 09/17/2002.  Her last INR was 2.76.  Anticoagulation responsible provider: Clifton James MD, Cristal Deer.  INR POC: 3.4.  Cuvette Lot#: [id number].  Exp: 08/2010.    Anticoagulation Management Assessment/Plan:      The patient's current anticoagulation dose is Warfarin sodium 4 mg tabs: Use as directed by Anticoagulation Clinic.  The target INR is 2.0-3.0.  The next INR is due 07/02/2009.  Anticoagulation instructions were given to patient.  Results were reviewed/authorized by Cloyde Reams, RN, BSN.  She was notified by Cloyde Reams RN.         Prior Anticoagulation Instructions: The patient's dosage of coumadin will be increased.  The new dosage includes: Take one and a half tablets (6mg ) today then take 1 tablet (4mg ) daily except half a tablet (2mg ) on Sundays, Tuesdays, and Thursdays.  Current Anticoagulation Instructions: INR 3.4  Take 1/2 tablet today then start taking 1/2 tablet daily except 1 tablet  on Mondays, Wednesdays, and Fridays.  Recheck in 2 weeks.

## 2010-05-05 NOTE — Medication Information (Signed)
Summary: rov/kb  Anticoagulant Therapy  Managed by: Weston Brass, PharmD Referring MD: Rollene Rotunda MD PCP: Sharlet Salina, MD Supervising MD: Riley Kill MD, Maisie Fus Indication 1: Atrial Fibrillation (ICD-427.31) Lab Used: [facility] Site: [facility] INR POC 2.3 INR RANGE 2.0-3.0  Dietary changes: yes       Details: Eating more greens  Health status changes: no    Bleeding/hemorrhagic complications: no    Recent/future hospitalizations: no    Any changes in medication regimen? no    Recent/future dental: no  Any missed doses?: no       Is patient compliant with meds? yes       Allergies: No Known Drug Allergies  Anticoagulation Management History:      The patient is taking warfarin and comes in today for a routine follow up visit.  Positive risk factors for bleeding include an age of 75 years or older and presence of serious comorbidities.  The bleeding index is 'intermediate risk'.  Positive CHADS2 values include History of HTN and Age > 106 years old.  The start date was 09/17/2002.  Her last INR was 2.76.  Anticoagulation responsible provider: Riley Kill MD, Maisie Fus.  INR POC: 2.3.  Cuvette Lot#: [id number].  Exp: 11/2010.    Anticoagulation Management Assessment/Plan:      The patient's current anticoagulation dose is Warfarin sodium 4 mg tabs: Use as directed by Anticoagulation Clinic.  The target INR is 2.0-3.0.  The next INR is due 09/22/2009.  Anticoagulation instructions were given to patient.  Results were reviewed/authorized by Weston Brass, PharmD.  She was notified by [facility].         Prior Anticoagulation Instructions: INR-3.5 Skip dose today and resume normal schedule.  Take 1  tablet Monday Wednesday and Friday and take 1/2 a tablet on all other days of each week. Return in 2 weeks  Current Anticoagulation Instructions: INR-2.3 Resume normal dosing schedule.Take 1 tablet on Monday, Wednesday and Friday and take half a tablet on all other days. Patient on  vacation in [place] form early June to July will have INR checked in [place] on 09/22/09, issued her with paperwork.

## 2010-05-05 NOTE — Cardiovascular Report (Signed)
Summary: Office Visit Remote   Office Visit Remote   Imported By: Roderic Ovens 04/14/2009 14:54:38  _____________________________________________________________________  External Attachment:    Type:   Image     Comment:   External Document

## 2010-05-05 NOTE — Medication Information (Signed)
Summary: rov/tm  Anticoagulant Therapy  Managed by: Weston Brass, PharmD Referring MD: Rollene Rotunda MD PCP: Sharlet Salina, MD Supervising MD: Riley Kill MD, Maisie Fus Indication 1: Atrial Fibrillation (ICD-427.31) Lab Used: [facility] Site: [facility] INR POC 2.0 INR RANGE 2.0-3.0  Dietary changes: no    Health status changes: no    Bleeding/hemorrhagic complications: no    Recent/future hospitalizations: no    Any changes in medication regimen? no    Recent/future dental: no  Any missed doses?: no       Is patient compliant with meds? yes       Allergies: No Known Drug Allergies  Anticoagulation Management History:      The patient is taking warfarin and comes in today for a routine follow up visit.  Positive risk factors for bleeding include an age of 74 years or older and presence of serious comorbidities.  The bleeding index is 'intermediate risk'.  Positive CHADS2 values include History of HTN and Age > 74 years old.  The start date was 09/17/2002.  Her last INR was 2.76.  Anticoagulation responsible Taylyn Brame: Riley Kill MD, Maisie Fus.  INR POC: 2.0.  Cuvette Lot#: [id number].  Exp: 01/2011.    Anticoagulation Management Assessment/Plan:      The patient's current anticoagulation dose is Warfarin sodium 4 mg tabs: Use as directed by Anticoagulation Clinic.  The target INR is 2.0-3.0.  The next INR is due 12/27/2009.  Anticoagulation instructions were given to patient.  Results were reviewed/authorized by Weston Brass, PharmD.  She was notified by Gweneth Fritter, PharmD Candidate.         Prior Anticoagulation Instructions: INR 2.1 Continue 2mg s everyday except 4mg s on Mondays, Wednesdays and Fridays. Recheck in 4 weeks.   Current Anticoagulation Instructions: INR 2.0  Continue taking 1/2 tablet (2mg ) every day except take 1 tablet (4mg ) on Mondays, Wednesdays, and Fridays.  Recheck in 4 week.

## 2010-05-05 NOTE — Letter (Signed)
Summary: [employer], newspaper, Main Office  [address]   [place], [place] [address]   Phone: [phone]  Fax: [phone]     October 06, 2009 MRN: [id number]   [name] [address] [place], [place]  [address]   Dear Ms. BADOUR,  According to our records, you were scheduled for a device phone transmission on  09-29-2009.     We did not receive any results from this check.  If you transmitted on your scheduled day, please call us to help troubleshoot your system.  If you forgot to send your transmission, please send one upon receipt of this letter.  Thank you,   Arch[facility]

## 2010-05-05 NOTE — Medication Information (Signed)
Summary: rov/ewj  Anticoagulant Therapy  Managed by: Elaina Pattee, PharmD Referring MD: Rollene Rotunda MD PCP: Sharlet Salina, MD Supervising MD: Tenny Craw MD, Gunnar Fusi Indication 1: Atrial Fibrillation (ICD-427.31) Lab Used: [facility] Site: [facility] INR POC 3.1 INR RANGE 2.0-3.0  Dietary changes: yes       Details: Yonke have eaten fewer green vegetables.  Health status changes: no    Bleeding/hemorrhagic complications: no    Recent/future hospitalizations: yes       Details: Emerick have skin patch removed.  Aware pt is on Coumadin.  Any changes in medication regimen? yes       Details: Finished Doxycycline 100 mg BID yesterday. Mupirocin ointment to nares for 1 month (DC 4/21).  Recent/future dental: no  Any missed doses?: no       Is patient compliant with meds? yes       Allergies: No Known Drug Allergies  Anticoagulation Management History:      The patient is taking warfarin and comes in today for a routine follow up visit.  Positive risk factors for bleeding include an age of 75 years or older and presence of serious comorbidities.  The bleeding index is 'intermediate risk'.  Positive CHADS2 values include History of HTN and Age > 49 years old.  The start date was 09/17/2002.  Her last INR was 2.76.  Anticoagulation responsible provider: Tenny Craw MD, Gunnar Fusi.  INR POC: 3.1.  Cuvette Lot#: [id number].  Exp: 08/2010.    Anticoagulation Management Assessment/Plan:      The patient's current anticoagulation dose is Warfarin sodium 4 mg tabs: Use as directed by Anticoagulation Clinic.  The target INR is 2.0-3.0.  The next INR is due 07/19/2009.  Anticoagulation instructions were given to patient.  Results were reviewed/authorized by Elaina Pattee, PharmD.  She was notified by Elaina Pattee, PharmD.         Prior Anticoagulation Instructions: INR 3.4  Take 1/2 tablet today then start taking 1/2 tablet daily except 1 tablet on Mondays, Wednesdays, and Fridays.  Recheck in 2 weeks.     Current Anticoagulation Instructions: INR 3.1. Take 0.5 tablet today, then continue taking 0.5 tablet daily except 1 tablet on Mon, Wed, Fri.  Recheck in 2-3 weeks.

## 2010-05-05 NOTE — Medication Information (Signed)
Summary: rov/sp  Anticoagulant Therapy  Managed by: Weston Brass, PharmD Referring MD: Rollene Rotunda MD PCP: Sharlet Salina, MD Supervising MD: Ladona Ridgel MD, Sharlot Gowda Indication 1: Atrial Fibrillation (ICD-427.31) Lab Used: [facility] Site: [facility] INR POC 2.6 INR RANGE 2.0-3.0  Dietary changes: no    Health status changes: no    Bleeding/hemorrhagic complications: no    Recent/future hospitalizations: yes       Details: Tooth extraction yesterday, did not discontinue Coumadin for procedue. No bleeding complications.   Any changes in medication regimen? no    Recent/future dental: no  Any missed doses?: no       Is patient compliant with meds? yes       Allergies: No Known Drug Allergies  Anticoagulation Management History:      The patient is taking warfarin and comes in today for a routine follow up visit.  Positive risk factors for bleeding include an age of 64 years or older and presence of serious comorbidities.  The bleeding index is 'intermediate risk'.  Positive CHADS2 values include History of HTN and Age > 20 years old.  The start date was 09/17/2002.  Her last INR was 2.76.  Anticoagulation responsible provider: Ladona Ridgel MD, Sharlot Gowda.  INR POC: 2.6.  Cuvette Lot#: [id number].  Exp: 04/2011.    Anticoagulation Management Assessment/Plan:      The patient's current anticoagulation dose is Warfarin sodium 4 mg tabs: Use as directed by Anticoagulation Clinic.  The target INR is 2.0-3.0.  The next INR is due 05/19/2010.  Anticoagulation instructions were given to patient.  Results were reviewed/authorized by Weston Brass, PharmD.  She was notified by Stephannie Peters, PharmD Candidate .         Prior Anticoagulation Instructions: INR 2.2  Continue same dose of 1/2 tablet every day except 1 tablet on Monday, Wednesday and Friday.  Recheck INR in 4 weeks.   Current Anticoagulation Instructions: INR 2.6  Coumadin 4 mg tablets - Continue 1/2 tablet every day except 1 tablet on  Mondays, Wednesdays and Fridays

## 2010-05-05 NOTE — Medication Information (Signed)
Summary: rov/cb  Anticoagulant Therapy  Managed by: Cloyde Reams, RN, BSN Referring MD: Rollene Rotunda MD PCP: Sharlet Salina, MD Supervising MD: Juanda Chance MD, Emery Dupuy Indication 1: Atrial Fibrillation (ICD-427.31) Lab Used: [facility] Site: [facility] INR POC 2.7 INR RANGE 2.0-3.0  Dietary changes: no    Health status changes: no    Bleeding/hemorrhagic complications: no    Recent/future hospitalizations: no    Any changes in medication regimen? yes       Details: Continues Muciprocin ointment to nares and Hibiclens.    Recent/future dental: no  Any missed doses?: no       Is patient compliant with meds? yes       Allergies (verified): No Known Drug Allergies  Anticoagulation Management History:      The patient is taking warfarin and comes in today for a routine follow up visit.  Positive risk factors for bleeding include an age of 20 years or older and presence of serious comorbidities.  The bleeding index is 'intermediate risk'.  Positive CHADS2 values include History of HTN and Age > 64 years old.  The start date was 09/17/2002.  Her last INR was 2.76.  Anticoagulation responsible provider: Juanda Chance MD, Smitty Cords.  INR POC: 2.7.  Cuvette Lot#: [id number].  Exp: 08/2010.    Anticoagulation Management Assessment/Plan:      The patient's current anticoagulation dose is Warfarin sodium 4 mg tabs: Use as directed by Anticoagulation Clinic.  The target INR is 2.0-3.0.  The next INR is due 08/16/2009.  Anticoagulation instructions were given to patient.  Results were reviewed/authorized by Cloyde Reams, RN, BSN.  She was notified by Cloyde Reams RN.         Prior Anticoagulation Instructions: INR 3.1. Take 0.5 tablet today, then continue taking 0.5 tablet daily except 1 tablet on Mon, Wed, Fri.  Recheck in 2-3 weeks.  Current Anticoagulation Instructions: INR 2.7  Continue on same dosage 1/2 tablet daily except 1 tablet on Mondays, Wednesdays, and Fridays.  Recheck in 4 weeks.

## 2010-05-05 NOTE — Medication Information (Signed)
Summary: rov coumadin - [facility]  Anticoagulant Therapy  Managed by: Weston Brass, PharmD Referring MD: Rollene Rotunda MD PCP: Sharlet Salina, MD Supervising MD: Riley Kill MD, Maisie Fus Indication 1: Atrial Fibrillation (ICD-427.31) Lab Used: [facility] Site: [facility] INR POC 2.0 INR RANGE 2.0-3.0  Dietary changes: no    Health status changes: no    Bleeding/hemorrhagic complications: no    Recent/future hospitalizations: no    Any changes in medication regimen? no    Recent/future dental: no  Any missed doses?: no       Is patient compliant with meds? yes       Allergies: No Known Drug Allergies  Anticoagulation Management History:      The patient is taking warfarin and comes in today for a routine follow up visit.  Positive risk factors for bleeding include an age of 101 years or older and presence of serious comorbidities.  The bleeding index is 'intermediate risk'.  Positive CHADS2 values include History of HTN and Age > 60 years old.  The start date was 09/17/2002.  Her last INR was 2.76.  Anticoagulation responsible Rumeal Cullipher: Riley Kill MD, Maisie Fus.  INR POC: 2.0.  Cuvette Lot#: [id number].  Exp: 02/2011.    Anticoagulation Management Assessment/Plan:      The patient's current anticoagulation dose is Warfarin sodium 4 mg tabs: Use as directed by Anticoagulation Clinic.  The target INR is 2.0-3.0.  The next INR is due 02/11/2010.  Anticoagulation instructions were given to patient.  Results were reviewed/authorized by Weston Brass, PharmD.  She was notified by Weston Brass PharmD.         Prior Anticoagulation Instructions: INR 2.7  Coumadin 1 tab = 4mg  on Mon, Wed, Fri  1/2 tab = 2mg  on Tue, Thur, Sat and Sun  Current Anticoagulation Instructions: INR 2.0  Continue same dose of 1/2 tablet every day except 1 tablet on Monday, Wednesday and Friday.  Recheck INR in 4 weeks in [place].

## 2010-05-05 NOTE — Medication Information (Signed)
Summary: rov/tm  Anticoagulant Therapy  Managed by: Minerva Areola, RN Referring MD: Rollene Rotunda MD PCP: Sharlet Salina, MD Supervising MD: Juanda Chance MD, Masae Lukacs Indication 1: Atrial Fibrillation (ICD-427.31) Lab [facility]  Site: [facility] INR POC 1.8 INR RANGE 2.0-3.0  Dietary changes: no    Health status changes: no    Bleeding/hemorrhagic complications: no    Recent/future hospitalizations: no    Any changes in medication regimen? no    Recent/future dental: no  Any missed doses?: no       Is patient compliant with meds? yes       Current Medications (verified): 1)  Amiodarone Hcl 200 Mg Tabs (Amiodarone Hcl) .... Daily 2)  Metoprolol Tartrate 50 Mg Tabs (Metoprolol Tartrate) .... Daily 3)  Synthroid 88 Mcg Tabs (Levothyroxine Sodium) .Marland Kitchen.. 1 Tablet Daily 4)  Triamterene-Hctz 37.5-25 Mg Tabs (Triamterene-Hctz) .Marland Kitchen.. 1 Daily 5)  Icaps  Caps (Multiple Vitamins-Minerals) .Marland Kitchen.. 1 Daily 6)  Restasis 0.05 % Emul (Cyclosporine) .Marland Kitchen.. 1 Drop Twice A Daily 7)  Alprazolam 0.5 Mg Tabs (Alprazolam) .Marland Kitchen.. 1 Tablet Daily At Night 8)  Nexium 40 Mg Cpdr (Esomeprazole Magnesium) .Marland Kitchen.. 1 Daily 9)  Metamucil Smooth Texture 63 % Powd (Psyllium) .Marland Kitchen.. 1 Capful Daily 10)  Warfarin Sodium 4 Mg Tabs (Warfarin Sodium) .... Use As Directed By Anticoagulation Clinic 11)  Vitamin D 1000 Unit Tabs (Cholecalciferol) .... Take 1 Tablet By Mouth Once A Day 12)  Ferrous Sulfate 325 (65 Fe) Mg Tabs (Ferrous Sulfate) .... Take 1 Tablet By Mouth Once A Day  Allergies (verified): No Known Drug Allergies  Anticoagulation Management History:      The patient is taking warfarin and comes in today for a routine follow up visit.  Positive risk factors for bleeding include an age of 56 years or older and presence of serious comorbidities.  The bleeding index is 'intermediate risk'.  Positive CHADS2 values include History of HTN and Age > 82 years old.  The start date was 09/17/2002.  Her last INR was  2.76.  Anticoagulation responsible provider: Juanda Chance MD, Smitty Cords.  INR POC: 1.8.  Cuvette Lot#: [id number].  Exp: 08/2010.    Anticoagulation Management Assessment/Plan:      The patient's current anticoagulation dose is Warfarin sodium 4 mg tabs: Use as directed by Anticoagulation Clinic.  The target INR is 2.0-3.0.  The next INR is due 06/18/2009.  Anticoagulation instructions were given to patient.  Results were reviewed/authorized by Minerva Areola, RN.  She was notified by Minerva Areola, RN, BSN.         Prior Anticoagulation Instructions: INR 1.9 Today take 4mg  then change dose to 2mg s everyday except 4mg s on Mondays, Wednesdays  and Fridays. Recheck in 2-3 weeks.   Current Anticoagulation Instructions: The patient's dosage of coumadin will be increased.  The new dosage includes: Take one and a half tablets (6mg ) today then take 1 tablet (4mg ) daily except half a tablet (2mg ) on Sundays, Tuesdays, and Thursdays.

## 2010-05-05 NOTE — Medication Information (Signed)
Summary: rov/ewj  Anticoagulant Therapy  Managed by: Cloyde Reams, RN, BSN Referring MD: Rollene Rotunda MD PCP: Sharlet Salina, MD Supervising MD: Eden Emms MD, Theron Arista Indication 1: Atrial Fibrillation (ICD-427.31) Lab Used: [facility] Site: [facility] INR POC 2.0 INR RANGE 2.0-3.0  Dietary changes: no    Health status changes: no    Bleeding/hemorrhagic complications: no    Recent/future hospitalizations: no    Any changes in medication regimen? yes       Details: Updated med list in EMR.  Recent/future dental: no  Any missed doses?: no       Is patient compliant with meds? yes       Current Medications (verified): 1)  Amiodarone Hcl 200 Mg Tabs (Amiodarone Hcl) .... Daily 2)  Metoprolol Tartrate 50 Mg Tabs (Metoprolol Tartrate) .... Daily 3)  Synthroid 88 Mcg Tabs (Levothyroxine Sodium) .Marland Kitchen.. 1 Tablet Daily 4)  Triamterene-Hctz 37.5-25 Mg Tabs (Triamterene-Hctz) .Marland Kitchen.. 1 Daily 5)  Icaps  Caps (Multiple Vitamins-Minerals) .Marland Kitchen.. 1 Daily 6)  Restasis 0.05 % Emul (Cyclosporine) .Marland Kitchen.. 1 Drop Twice A Daily 7)  Alprazolam 0.5 Mg Tabs (Alprazolam) .Marland Kitchen.. 1 Tablet Daily At Night 8)  Nexium 40 Mg Cpdr (Esomeprazole Magnesium) .Marland Kitchen.. 1 Daily 9)  Metamucil Smooth Texture 63 % Powd (Psyllium) .Marland Kitchen.. 1 Capful Daily 10)  Warfarin Sodium 4 Mg Tabs (Warfarin Sodium) .... Use As Directed By Anticoagulation Clinic 11)  Vitamin D 1000 Unit Tabs (Cholecalciferol) .... Take 1 Tablet By Mouth Once A Day 12)  Ferrous Sulfate 325 (65 Fe) Mg Tabs (Ferrous Sulfate) .... Take 1 Tablet By Mouth Once A Day  Allergies (verified): No Known Drug Allergies  Anticoagulation Management History:      The patient is taking warfarin and comes in today for a routine follow up visit.  Positive risk factors for bleeding include an age of 75 years or older and presence of serious comorbidities.  The bleeding index is 'intermediate risk'.  Positive CHADS2 values include History of HTN and Age > 45 years old.  The  start date was 09/17/2002.  Her last INR was 2.76.  Anticoagulation responsible provider: Eden Emms MD, Theron Arista.  INR POC: 2.0.  Cuvette Lot#: [id number].  Exp: 07/2010.    Anticoagulation Management Assessment/Plan:      The patient's current anticoagulation dose is Warfarin sodium 4 mg tabs: Use as directed by Anticoagulation Clinic.  The target INR is 2.0-3.0.  The next INR is due 05/19/2009.  Anticoagulation instructions were given to patient.  Results were reviewed/authorized by Cloyde Reams, RN, BSN.  She was notified by Cloyde Reams RN.         Prior Anticoagulation Instructions: INR 2.91 Continue 2mg s daily except 4mg s on Mondays and Fridays. Recheck in 4 weeks.   Current Anticoagulation Instructions: INR 2.0  Take 1 tablet today then resume same dosage 1/2 tablet daily except 1 tablet on Mondays and Fridays.  Recheck in 4 weeks.

## 2010-05-05 NOTE — Medication Information (Signed)
Summary: rov/sp  Anticoagulant Therapy  Managed by: Bethena Midget, RN, BSN Referring MD: Rollene Rotunda MD PCP: Sharlet Salina, MD Supervising MD: Eden Emms MD, Theron Arista Indication 1: Atrial Fibrillation (ICD-427.31) Lab Used: [facility] Site: [facility] INR POC 2.1 INR RANGE 2.0-3.0  Dietary changes: no    Health status changes: no    Bleeding/hemorrhagic complications: no    Recent/future hospitalizations: no    Any changes in medication regimen? no    Recent/future dental: no  Any missed doses?: no       Is patient compliant with meds? yes       Allergies: No Known Drug Allergies  Anticoagulation Management History:      The patient is taking warfarin and comes in today for a routine follow up visit.  Positive risk factors for bleeding include an age of 75 years or older and presence of serious comorbidities.  The bleeding index is 'intermediate risk'.  Positive CHADS2 values include History of HTN and Age > 85 years old.  The start date was 09/17/2002.  Her last INR was 2.76.  Anticoagulation responsible provider: Eden Emms MD, Theron Arista.  INR POC: 2.1.  Cuvette Lot#: [id number].  Exp: 01/2011.    Anticoagulation Management Assessment/Plan:      The patient's current anticoagulation dose is Warfarin sodium 4 mg tabs: Use as directed by Anticoagulation Clinic.  The target INR is 2.0-3.0.  The next INR is due 11/29/2009.  Anticoagulation instructions were given to patient.  Results were reviewed/authorized by Bethena Midget, RN, BSN.  She was notified by Bethena Midget, RN, BSN.         Prior Anticoagulation Instructions: INR 1.5  Take 1 1/2 tablets today then resume same dose of 1/2 tablet every day except 1 tablet on Monday, Wednesday and Friday.    Current Anticoagulation Instructions: INR 2.1 Continue 2mg s everyday except 4mg s on Mondays, Wednesdays and Fridays. Recheck in 4 weeks.

## 2010-05-05 NOTE — Medication Information (Signed)
Summary: [name]  Anticoagulant Therapy  Managed by: Bethena Midget, RN, BSN Referring MD: Rollene Rotunda MD PCP: Sharlet Salina, MD Supervising MD: Johney Frame MD, Fayrene Fearing Indication 1: Atrial Fibrillation (ICD-427.31) Lab Used: [facility] Site: [facility] INR POC 1.9 INR RANGE 2.0-3.0  Dietary changes: no    Health status changes: no    Bleeding/hemorrhagic complications: no    Recent/future hospitalizations: no    Any changes in medication regimen? no    Recent/future dental: no  Any missed doses?: no       Is patient compliant with meds? yes       Current Medications (verified): 1)  Amiodarone Hcl 200 Mg Tabs (Amiodarone Hcl) .... Daily 2)  Metoprolol Tartrate 50 Mg Tabs (Metoprolol Tartrate) .... Daily 3)  Synthroid 88 Mcg Tabs (Levothyroxine Sodium) .Marland Kitchen.. 1 Tablet Daily 4)  Triamterene-Hctz 37.5-25 Mg Tabs (Triamterene-Hctz) .Marland Kitchen.. 1 Daily 5)  Icaps  Caps (Multiple Vitamins-Minerals) .Marland Kitchen.. 1 Daily 6)  Restasis 0.05 % Emul (Cyclosporine) .Marland Kitchen.. 1 Drop Twice A Daily 7)  Alprazolam 0.5 Mg Tabs (Alprazolam) .Marland Kitchen.. 1 Tablet Daily At Night 8)  Nexium 40 Mg Cpdr (Esomeprazole Magnesium) .Marland Kitchen.. 1 Daily 9)  Metamucil Smooth Texture 63 % Powd (Psyllium) .Marland Kitchen.. 1 Capful Daily 10)  Warfarin Sodium 4 Mg Tabs (Warfarin Sodium) .... Use As Directed By Anticoagulation Clinic 11)  Vitamin D 1000 Unit Tabs (Cholecalciferol) .... Take 1 Tablet By Mouth Once A Day 12)  Ferrous Sulfate 325 (65 Fe) Mg Tabs (Ferrous Sulfate) .... Take 1 Tablet By Mouth Once A Day  Allergies: No Known Drug Allergies  Anticoagulation Management History:      The patient is taking warfarin and comes in today for a routine follow up visit.  Positive risk factors for bleeding include an age of 18 years or older and presence of serious comorbidities.  The bleeding index is 'intermediate risk'.  Positive CHADS2 values include History of HTN and Age > 16 years old.  The start date was 09/17/2002.  Her last INR was 2.76.   Anticoagulation responsible provider: Anniston Nellums MD, Fayrene Fearing.  INR POC: 1.9.  Cuvette Lot#: [id number].  Exp: 07/2010.    Anticoagulation Management Assessment/Plan:      The patient's current anticoagulation dose is Warfarin sodium 4 mg tabs: Use as directed by Anticoagulation Clinic.  The target INR is 2.0-3.0.  The next INR is due 06/04/2009.  Anticoagulation instructions were given to patient.  Results were reviewed/authorized by Bethena Midget, RN, BSN.  She was notified by Bethena Midget, RN, BSN.         Prior Anticoagulation Instructions: INR 2.0  Take 1 tablet today then resume same dosage 1/2 tablet daily except 1 tablet on Mondays and Fridays.  Recheck in 4 weeks.    Current Anticoagulation Instructions: INR 1.9 Today take 4mg  then change dose to 2mg s everyday except 4mg s on Mondays, Wednesdays  and Fridays. Recheck in 2-3 weeks.

## 2010-05-05 NOTE — Assessment & Plan Note (Signed)
Summary: YEARLY/SL   Visit Type:  Follow-up Primary Provider:  Sharlet Salina, MD  CC:  Atrial Fibrillation.  History of Present Illness: The patient presents for followup. Since I last saw her she has done quite well. She remains very active and lives independently. She has not noticed any palpitations, presyncope or syncope. She has had no shortness of breath, PND or orthopnea. She denies any chest pressure, neck or arm discomfort. She has had pacemaker followup and maintains followup in our Coumadin clinic without problem.  Current Medications (verified): 1)  Amiodarone Hcl 200 Mg Tabs (Amiodarone Hcl) .... Daily 2)  Metoprolol Tartrate 50 Mg Tabs (Metoprolol Tartrate) .... Daily 3)  Synthroid 88 Mcg Tabs (Levothyroxine Sodium) .Marland Kitchen.. 1 Tablet Daily 4)  Triamterene-Hctz 37.5-25 Mg Tabs (Triamterene-Hctz) .Marland Kitchen.. 1 Daily 5)  Icaps  Caps (Multiple Vitamins-Minerals) .Marland Kitchen.. 1 Daily 6)  Restasis 0.05 % Emul (Cyclosporine) .Marland Kitchen.. 1 Drop Twice A Daily 7)  Alprazolam 0.5 Mg Tabs (Alprazolam) .Marland Kitchen.. 1 Tablet Daily At Night 8)  Nexium 40 Mg Cpdr (Esomeprazole Magnesium) .Marland Kitchen.. 1 Daily 9)  Metamucil Smooth Texture 63 % Powd (Psyllium) .Marland Kitchen.. 1 Capful Daily 10)  Warfarin Sodium 4 Mg Tabs (Warfarin Sodium) .... Use As Directed By Anticoagulation Clinic 11)  Vitamin D 1000 Unit Tabs (Cholecalciferol) .... Take 1 Tablet By Mouth Once A Day 12)  Ferrous Sulfate 325 (65 Fe) Mg Tabs (Ferrous Sulfate) .... Take 1 Tablet By Mouth Once A Day  Allergies (verified): No Known Drug Allergies  Past History:  Past Medical History: Reviewed history from 09/17/2008 and no changes required. Atrial flutter status post ablation Tachybrady  syndrome status post pacemaker placement Arial fibrillation with chronic anticoagulation therapy Hypertension Asthma GERD Esophageal stricture Diverticulosis  Tremor.   Past Surgical History: Reviewed history from 09/17/2008 and no changes required. Status post permanent  pacemaker.  radiofrequency catheter ablation  Review of Systems       As stated in the HPI and negative for all other systems.   Vital Signs:  Patient profile:   77 year old female Height:      67 inches Weight:      150 pounds BMI:     23.58 Pulse rate:   73 / minute Resp:     16 per minute BP sitting:   118 / 68  (right arm)  Vitals Entered By: Marrion Coy, CNA (October 07, 2009 10:53 AM)  Physical Exam  General:  Well developed, well nourished, in no acute distress. Head:  normocephalic and atraumatic Mouth:  Teeth, gums and palate normal. Oral mucosa normal. Neck:  Neck supple, no JVD. No masses, thyromegaly or abnormal cervical nodes. Chest Wall:  Well healed PPM incision. Lungs:  few basilar crackles, no wheezing Abdomen:  Bowel sounds positive; abdomen soft and non-tender without masses, organomegaly, or hernias noted. No hepatosplenomegaly. Msk:  Back normal, normal gait. Muscle strength and tone normal. Extremities:  No clubbing or cyanosis. Neurologic:  Alert and oriented x 3. Skin:  Intact without lesions or rashes. Cervical Nodes:  no significant adenopathy Axillary Nodes:  no significant adenopathy Inguinal Nodes:  no significant adenopathy Psych:  Normal affect.   Detailed Cardiovascular Exam  Neck    Carotids: Carotids full and equal bilaterally without bruits.      Neck Veins: Normal, no JVD.    Heart    Inspection: no deformities or lifts noted.      Palpation: normal PMI with no thrills palpable.      Auscultation: Distant  heart sounds, slightly irregular, S1 and S2 within normal limits, no obvious murmurs  Vascular    Abdominal Aorta: no palpable masses, pulsations, or audible bruits.      Pedal Pulses: normal pedal pulses bilaterally.      Radial Pulses: normal radial pulses bilaterally.      Peripheral Circulation: no clubbing, cyanosis, or edema noted with normal capillary refill.     EKG  Procedure date:  10/07/2009  Findings:       Atrial paced rhythm with premature atrial contractions in a bigeminal pattern, axis within normal limits, intervals within normal limits, poor anterior R-wave progression, could not exclude anteroseptal infarct, no acute ST-T wave changes  PPM Specifications Following MD:  Lewayne Bunting, MD     PPM Vendor:  [employer]     PPM Model Number:  [id number]     PPM Serial Number:  [id number] PPM DOI:  01/18/2004     PPM Implanting MD:  Lewayne Bunting, MD  Lead 1    Location: [facility]     DOI: 01/18/2004     Model #: [id number]     Serial #: [id number]     Status: active Lead 2    Location: RV     DOI: 01/18/2004     Model #: [id number]     Serial #: [id number]     Status: active   Indications:  TACHY-BRADY SYNDROME   PPM Follow Up Pacer Dependent:  No      Episodes Coumadin:  Yes  Parameters Mode:  DDDR     Lower Rate Limit:  60     Upper Rate Limit:  110 Paced AV Delay:  350     Sensed AV Delay:  300  Impression & Recommendations:  Problem # 1:  BRADYCARDIA (ICD-427.89) Tpatient has tachybradycardia syndrome. She seems to be asymptomatic on current therapy and with pacemaker placement. When she comes back in 2 weeks for a Coumadin check I will check a TSH and liver profile on amiodarone. Otherwise I will make no change to her medical regimen. Orders: EKG w/ Interpretation (93000)  Problem # 2:  HYPERTENSION (ICD-401.9) Her blood pressure is controlled and she will continue the meds as listed.  Patient Instructions: 1)  Your physician recommends that you schedule a follow-up appointment in: 1 yr with Dr Antoine Poche 2)  Your physician recommends that you return for lab work same day as PT/INR  need a liver profile and TSH   427.31 v58.69 3)  Your physician recommends that you continue on your current medications as directed. Please refer to the Current Medication list given to you today.

## 2010-05-05 NOTE — Medication Information (Signed)
Summary: rov/jk  Anticoagulant Therapy  Managed by: Leota Sauers, PharmD, BCPS, CPP Referring MD: Rollene Rotunda MD PCP: Sharlet Salina, MD Supervising MD: Shirlee Latch MD, Dalton Indication 1: Atrial Fibrillation (ICD-427.31) Lab Used: [facility] Site: [facility] INR POC 2.7 INR RANGE 2.0-3.0  Dietary changes: no    Health status changes: no    Bleeding/hemorrhagic complications: no    Recent/future hospitalizations: no    Any changes in medication regimen? no    Recent/future dental: no  Any missed doses?: no       Is patient compliant with meds? yes       Current Medications (verified): 1)  Amiodarone Hcl 200 Mg Tabs (Amiodarone Hcl) .... Daily 2)  Metoprolol Tartrate 50 Mg Tabs (Metoprolol Tartrate) .... Daily 3)  Synthroid 88 Mcg Tabs (Levothyroxine Sodium) .Marland Kitchen.. 1 Tablet Daily 4)  Triamterene-Hctz 37.5-25 Mg Tabs (Triamterene-Hctz) .Marland Kitchen.. 1 Daily 5)  Icaps  Caps (Multiple Vitamins-Minerals) .Marland Kitchen.. 1 Daily 6)  Restasis 0.05 % Emul (Cyclosporine) .Marland Kitchen.. 1 Drop Twice A Daily 7)  Alprazolam 0.5 Mg Tabs (Alprazolam) .Marland Kitchen.. 1 Tablet Daily At Night 8)  Nexium 40 Mg Cpdr (Esomeprazole Magnesium) .Marland Kitchen.. 1 Daily 9)  Metamucil Smooth Texture 63 % Powd (Psyllium) .Marland Kitchen.. 1 Capful Daily 10)  Warfarin Sodium 4 Mg Tabs (Warfarin Sodium) .... Use As Directed By Anticoagulation Clinic 11)  Vitamin D 1000 Unit Tabs (Cholecalciferol) .... Take 1 Tablet By Mouth Once A Day 12)  Ferrous Sulfate 325 (65 Fe) Mg Tabs (Ferrous Sulfate) .... Take 1 Tablet By Mouth Once A Day  Allergies (verified): No Known Drug Allergies  Anticoagulation Management History:      The patient is taking warfarin and comes in today for a routine follow up visit.  Positive risk factors for bleeding include an age of 31 years or older and presence of serious comorbidities.  The bleeding index is 'intermediate risk'.  Positive CHADS2 values include History of HTN and Age > 2 years old.  The start date was 09/17/2002.  Her last  INR was 2.76.  Anticoagulation responsible provider: Shirlee Latch MD, Dalton.  INR POC: 2.7.  Cuvette Lot#: [id number].  Exp: 01/2011.    Anticoagulation Management Assessment/Plan:      The patient's current anticoagulation dose is Warfarin sodium 4 mg tabs: Use as directed by Anticoagulation Clinic.  The target INR is 2.0-3.0.  The next INR is due 01/27/2010.  Anticoagulation instructions were given to patient.  Results were reviewed/authorized by Leota Sauers, PharmD, BCPS, CPP.         Prior Anticoagulation Instructions: INR 2.0  Continue taking 1/2 tablet (2mg ) every day except take 1 tablet (4mg ) on Mondays, Wednesdays, and Fridays.  Recheck in 4 week.    Current Anticoagulation Instructions: INR 2.7  Coumadin 1 tab = 4mg  on Mon, Wed, Fri  1/2 tab = 2mg  on Tue, Thur, Sat and Sun

## 2010-05-05 NOTE — Medication Information (Signed)
Summary: rov/ewj  Anticoagulant Therapy  Managed by: Weston Brass, PharmD Referring MD: Rollene Rotunda MD PCP: Sharlet Salina, MD Supervising MD: Clifton James MD, Cristal Deer Indication 1: Atrial Fibrillation (ICD-427.31) Lab Used: [facility] Site: [facility] INR POC 3.5 INR RANGE 2.0-3.0  Dietary changes: no    Health status changes: no    Bleeding/hemorrhagic complications: no    Recent/future hospitalizations: no    Any changes in medication regimen? no    Recent/future dental: no  Any missed doses?: no       Is patient compliant with meds? yes       Allergies: No Known Drug Allergies  Anticoagulation Management History:      The patient is taking warfarin and comes in today for a routine follow up visit.  Positive risk factors for bleeding include an age of 20 years or older and presence of serious comorbidities.  The bleeding index is 'intermediate risk'.  Positive CHADS2 values include History of HTN and Age > 39 years old.  The start date was 09/17/2002.  Her last INR was 2.76.  Anticoagulation responsible provider: Clifton James MD, Cristal Deer.  INR POC: 3.5.  Cuvette Lot#: [id number].  Exp: 11/2010.    Anticoagulation Management Assessment/Plan:      The patient's current anticoagulation dose is Warfarin sodium 4 mg tabs: Use as directed by Anticoagulation Clinic.  The target INR is 2.0-3.0.  The next INR is due 09/01/2009.  Anticoagulation instructions were given to patient.  Results were reviewed/authorized by Weston Brass, PharmD.  She was notified by [facility].         Prior Anticoagulation Instructions: INR 2.7  Continue on same dosage 1/2 tablet daily except 1 tablet on Mondays, Wednesdays, and Fridays.  Recheck in 4 weeks.    Current Anticoagulation Instructions: INR-3.5 Skip dose today and resume normal schedule.  Take 1  tablet Monday Wednesday and Friday and take 1/2 a tablet on all other days of each week. Return in 2 weeks

## 2010-05-05 NOTE — Medication Information (Signed)
Summary: Coumadin Clinic  Anticoagulant Therapy  Managed by: Weston Brass, PharmD Referring MD: Rollene Rotunda MD PCP: Sharlet Salina, MD Supervising MD: Riley Kill MD, Maisie Fus Indication 1: Atrial Fibrillation (ICD-427.31) Lab Used: [facility] Site: [facility] PT 19.0 INR POC 2.3 INR RANGE 2.0-3.0  Dietary changes: no    Health status changes: no    Bleeding/hemorrhagic complications: no    Recent/future hospitalizations: no    Any changes in medication regimen? no    Recent/future dental: no  Any missed doses?: no       Is patient compliant with meds? yes       Allergies: No Known Drug Allergies  Anticoagulation Management History:      Her anticoagulation is being managed by telephone today.  Positive risk factors for bleeding include an age of 75 years or older and presence of serious comorbidities.  The bleeding index is 'intermediate risk'.  Positive CHADS2 values include History of HTN and Age > 58 years old.  The start date was 09/17/2002.  Her last INR was 2.76.  Prothrombin time is 19.0.  Anticoagulation responsible provider: Riley Kill MD, Maisie Fus.  INR POC: 2.3.  Exp: 02/2011.    Anticoagulation Management Assessment/Plan:      The patient's current anticoagulation dose is Warfarin sodium 4 mg tabs: Use as directed by Anticoagulation Clinic.  The target INR is 2.0-3.0.  The next INR is due 03/24/2010.  Anticoagulation instructions were given to patient.  Results were reviewed/authorized by Weston Brass, PharmD.  She was notified by Weston Brass PharmD.         Prior Anticoagulation Instructions: INR 2.2  Continue same dose of 2mg  daily except 4mg  on Monday, Wednesday and Friday.  Recheck INR in 4 weeks in [place].  Will call to get results.   Current Anticoagulation Instructions: INR 2.3  Spoke with pt.  Continue same dose of 1/2 tablet every day except 1 tablet on Monday, Wednesday and Friday.  Recheck INR in 2 weeks in office.

## 2010-05-05 NOTE — Cardiovascular Report (Signed)
Summary: Office Visit Remote   Office Visit Remote   Imported By: Roderic Ovens 10/13/2009 12:05:42  _____________________________________________________________________  External Attachment:    Type:   Image     Comment:   External Document

## 2010-05-05 NOTE — Letter (Signed)
Summary: Remote Device Check  [facility], Main Office  [address]   [place], [place] [address]   Phone: [phone]  Fax: [phone]     October 12, 2009 MRN: [id number]   ABAGAIL LIMB [address] [place], [place]  [address]   Dear Ms. Janae Bridgeman,   Your remote transmission was recieved and reviewed by your physician.  All diagnostics were within normal limits for you.   __X____Your next office visit is scheduled for:   September 2011. Please call our office to schedule an appointment.    Sincerely,  Vella Kohler

## 2010-05-05 NOTE — Medication Information (Signed)
Summary: Coumadin Clinic  Anticoagulant Therapy  Managed by: Weston Brass, PharmD Referring MD: Rollene Rotunda MD PCP: Sharlet Salina, MD Supervising MD: Riley Kill MD, Maisie Fus Indication 1: Atrial Fibrillation (ICD-427.31) Lab Used: [facility] Site: [facility] INR POC 2.2 INR RANGE 2.0-3.0          Comments: Pt had lab drawn in [facility].  MD office dosed INR.  Pt reports no issues or changes.    Allergies: No Known Drug Allergies  Anticoagulation Management History:      Positive risk factors for bleeding include an age of 75 years or older and presence of serious comorbidities.  The bleeding index is 'intermediate risk'.  Positive CHADS2 values include History of HTN and Age > 44 years old.  The start date was 09/17/2002.  Her last INR was 2.76.  Anticoagulation responsible Deaken Jurgens: Riley Kill MD, Maisie Fus.  INR POC: 2.2.  Exp: 02/2011.    Anticoagulation Management Assessment/Plan:      The patient's current anticoagulation dose is Warfarin sodium 4 mg tabs: Use as directed by Anticoagulation Clinic.  The target INR is 2.0-3.0.  The next INR is due 03/10/2010.  Anticoagulation instructions were given to patient.  Results were reviewed/authorized by Weston Brass, PharmD.  She was notified by Weston Brass PharmD.         Prior Anticoagulation Instructions: INR 2.0  Continue same dose of 1/2 tablet every day except 1 tablet on Monday, Wednesday and Friday.  Recheck INR in 4 weeks in [place].   Current Anticoagulation Instructions: INR 2.2  Continue same dose of 2mg  daily except 4mg  on Monday, Wednesday and Friday.  Recheck INR in 4 weeks in [place].  Will call to get results.

## 2010-05-05 NOTE — Assessment & Plan Note (Signed)
Summary: [employer]/SAf   Visit Type:  Device check Primary Provider:  Sharlet Salina, MD   History of Present Illness: [name] returns today for followup.She has a h/o symptomatic bradycardia s/p PPM.  She also has a h/o atrial flutter. No additional palpitations.  Since I last saw her she has done quite well. She remains very active and lives independently. She has not noticed any palpitations, presyncope or syncope. She has had no shortness of breath, PND or orthopnea. She denies any chest pressure, neck or arm discomfort.   Current Medications (verified): 1)  Amiodarone Hcl 200 Mg Tabs (Amiodarone Hcl) .... Daily 2)  Metoprolol Tartrate 50 Mg Tabs (Metoprolol Tartrate) .... Daily 3)  Synthroid 88 Mcg Tabs (Levothyroxine Sodium) .Marland Kitchen.. 1 Tablet Daily 4)  Triamterene-Hctz 37.5-25 Mg Tabs (Triamterene-Hctz) .Marland Kitchen.. 1 Daily 5)  Icaps  Caps (Multiple Vitamins-Minerals) .Marland Kitchen.. 1 Daily 6)  Restasis 0.05 % Emul (Cyclosporine) .Marland Kitchen.. 1 Drop Twice A Daily 7)  Alprazolam 0.5 Mg Tabs (Alprazolam) .Marland Kitchen.. 1 Tablet Daily At Night 8)  Nexium 40 Mg Cpdr (Esomeprazole Magnesium) .Marland Kitchen.. 1 Daily 9)  Metamucil Smooth Texture 63 % Powd (Psyllium) .Marland Kitchen.. 1 Capful Daily 10)  Warfarin Sodium 4 Mg Tabs (Warfarin Sodium) .... Use As Directed By Anticoagulation Clinic 11)  Vitamin D 1000 Unit Tabs (Cholecalciferol) .... Take 1 Tablet By Mouth Once A Day 12)  Ferrous Sulfate 325 (65 Fe) Mg Tabs (Ferrous Sulfate) .... Take 1 Tablet By Mouth Once A Day  Allergies (verified): No Known Drug Allergies  Past History:  Past Medical History: Last updated: 09/17/2008 Atrial flutter status post ablation Tachybrady  syndrome status post pacemaker placement Arial fibrillation with chronic anticoagulation therapy Hypertension Asthma GERD Esophageal stricture Diverticulosis  Tremor.   Past Surgical History: Last updated: 09/17/2008 Status post permanent pacemaker.  radiofrequency catheter ablation  Review of  Systems  The patient denies chest pain, syncope, dyspnea on exertion, and peripheral edema.    Vital Signs:  Patient profile:   77 year old female Height:      67 inches Weight:      151.25 pounds BMI:     23.77 Pulse rate:   64 / minute Pulse rhythm:   regular Resp:     18 per minute BP sitting:   163 / 67  (left arm) Cuff size:   large  Vitals Entered By: Vikki Ports (April 06, 2010 10:17 AM)  Physical Exam  General:  Well developed, well nourished, in no acute distress. Head:  normocephalic and atraumatic Eyes:  PERRLA/EOM intact; conjunctiva and lids normal. Mouth:  Teeth, gums and palate normal. Oral mucosa normal. Neck:  Neck supple, no JVD. No masses, thyromegaly or abnormal cervical nodes. Chest Wall:  Well healed PPM incision. Lungs:  few basilar crackles, no wheezing Heart:  RRR with normal S1 and S2.  PMI is not enlarged or laterally displaced. No murmurs. Abdomen:  Bowel sounds positive; abdomen soft and non-tender without masses, organomegaly, or hernias noted. No hepatosplenomegaly. Msk:  Back normal, normal gait. Muscle strength and tone normal. Pulses:  normal pedal pulses bilaterally.   Extremities:  No clubbing or cyanosis. Neurologic:  Alert and oriented x 3.   PPM Specifications Following MD:  Lewayne Bunting, MD     PPM Vendor:  [employer]     PPM Model Number:  [id number]     PPM Serial Number:  [id number] PPM DOI:  01/18/2004     PPM Implanting MD:  Lewayne Bunting, MD  Lead 1  Location: RA     DOI: 01/18/2004     Model #: [id number]     Serial #: [id number]     Status: active Lead 2    Location: RV     DOI: 01/18/2004     Model #: [id number]     Serial #: [id number]     Status: active   Indications:  TACHY-BRADY SYNDROME   PPM Follow Up Battery Voltage:  2.99 V     Pacer Dependent:  No       PPM Device Measurements Atrium  Amplitude: 2.4 mV, Impedance: 696 ohms, Threshold: 0.5 V at 0.4 msec Right Ventricle  Amplitude: 7.9 mV, Impedance: 392 ohms, Threshold:  1.5 V at 0.4 msec  Episodes MS Episodes:  904     Percent Mode Switch:  1.5%     Coumadin:  Yes Ventricular High Rate:  0     Atrial Pacing:  81.3%     Ventricular Pacing:  1.2%  Parameters Mode:  DDDR     Lower Rate Limit:  60     Upper Rate Limit:  110 Paced AV Delay:  350     Sensed AV Delay:  300 Next Remote Date:  07/07/2010     Next Cardiology Appt Due:  04/04/2011 Tech Comments:  904 AF EPISODES--LONGEST WAS 4 HOURS. + COUMADIN. NORMAL DEVICE FUNCTION.  NO CHANGES MADE. CARELINK 07-07-10 AND ROV IN 12 MTHS W/GT. Vella Kohler  April 06, 2010 10:27 AM MD Comments:  Agree with above.  Impression & Recommendations:  Problem # 1:  CARDIAC PACEMAKER IN SITU (ICD-V45.01) Her device is working normally.  Will follow.  Problem # 2:  HYPERTENSION (ICD-401.9) Her blood pressure is elevated.  I have asked her to maintain a low sodium diet. Her updated medication list for this problem includes:    Metoprolol Tartrate 50 Mg Tabs (Metoprolol tartrate) .Marland Kitchen... Daily    Triamterene-hctz 37.5-25 Mg Tabs (Triamterene-hctz) .Marland Kitchen... 1 daily  Problem # 3:  ATRIAL FIBRILLATION (ICD-427.31) She appears to be maintaining NSR nicely.  I will ask her to reduce her amiodarone dose. Will follow. Her updated medication list for this problem includes:    Amiodarone Hcl 200 Mg Tabs (Amiodarone hcl) ..... One daily except sat and sun 1/2 tablet    Metoprolol Tartrate 50 Mg Tabs (Metoprolol tartrate) .Marland Kitchen... Daily    Warfarin Sodium 4 Mg Tabs (Warfarin sodium) ..... Use as directed by anticoagulation clinic  Patient Instructions: 1)  Your physician wants you to follow-up in: 12 months with Dr  Court Joy will receive a reminder letter in the mail two months in advance. If you don't receive a letter, please call our office to schedule the follow-up appointment. 2)  Your physician has recommended you make the following change in your medication: decrease Amiodarone on Sat and Sun to 1/2 tablet and continue one  tablet all other days

## 2010-05-05 NOTE — Letter (Signed)
Summary: Remote Device Check  [employer], Main Office  [address]   [place], [place] [address]   Phone: [phone]  Fax: [phone]     April 06, 2009 MRN: [id number]   LILLE KARIM [address] [place], [place]  [address]   Dear Ms. Janae Bridgeman,   Your remote transmission was recieved and reviewed by your physician.  All diagnostics were within normal limits for you.  __X___Your next transmission is scheduled for:      June 23, 2009.  Please transmit at any time this day.  If you have a wireless device your transmission will be sent automatically.     Sincerely,  Proofreader

## 2010-05-05 NOTE — Medication Information (Signed)
Summary: ccr  Anticoagulant Therapy  Managed by: Weston Brass, PharmD Referring MD: Rollene Rotunda MD PCP: Sharlet Salina, MD Supervising MD: Juanda Chance MD, Windy Dudek Indication 1: Atrial Fibrillation (ICD-427.31) Lab Used: [facility] Site: [facility] INR POC 2.2 INR RANGE 2.0-3.0  Dietary changes: no    Health status changes: no    Bleeding/hemorrhagic complications: no    Recent/future hospitalizations: no    Any changes in medication regimen? no    Recent/future dental: no  Any missed doses?: no       Is patient compliant with meds? yes       Allergies: No Known Drug Allergies  Anticoagulation Management History:      The patient is taking warfarin and comes in today for a routine follow up visit.  Positive risk factors for bleeding include an age of 19 years or older and presence of serious comorbidities.  The bleeding index is 'intermediate risk'.  Positive CHADS2 values include History of HTN and Age > 83 years old.  The start date was 09/17/2002.  Her last INR was 2.76.  Anticoagulation responsible provider: Juanda Chance MD, Smitty Cords.  INR POC: 2.2.  Cuvette Lot#: [id number].  Exp: 04/2011.    Anticoagulation Management Assessment/Plan:      The patient's current anticoagulation dose is Warfarin sodium 4 mg tabs: Use as directed by Anticoagulation Clinic.  The target INR is 2.0-3.0.  The next INR is due 04/21/2010.  Anticoagulation instructions were given to patient.  Results were reviewed/authorized by Weston Brass, PharmD.  She was notified by Weston Brass PharmD.         Prior Anticoagulation Instructions: INR 2.3  Spoke with pt.  Continue same dose of 1/2 tablet every day except 1 tablet on Monday, Wednesday and Friday.  Recheck INR in 2 weeks in office.   Current Anticoagulation Instructions: INR 2.2  Continue same dose of 1/2 tablet every day except 1 tablet on Monday, Wednesday and Friday.  Recheck INR in 4 weeks.

## 2010-05-05 NOTE — Medication Information (Signed)
Summary: rov/tm  Anticoagulant Therapy  Managed by: Weston Brass, PharmD Referring MD: Rollene Rotunda MD PCP: Sharlet Salina, MD Supervising MD: Myrtis Ser MD, Tinnie Gens Indication 1: Atrial Fibrillation (ICD-427.31) Lab Used: [facility] Site: [facility] INR POC 1.5 INR RANGE 2.0-3.0  Dietary changes: yes       Details: increased vitamin k  Health status changes: no    Bleeding/hemorrhagic complications: no    Recent/future hospitalizations: no    Any changes in medication regimen? no    Recent/future dental: no  Any missed doses?: no       Is patient compliant with meds? yes       Allergies: No Known Drug Allergies  Anticoagulation Management History:      The patient is taking warfarin and comes in today for a routine follow up visit.  Positive risk factors for bleeding include an age of 37 years or older and presence of serious comorbidities.  The bleeding index is 'intermediate risk'.  Positive CHADS2 values include History of HTN and Age > 43 years old.  The start date was 09/17/2002.  Her last INR was 2.76.  Anticoagulation responsible provider: Myrtis Ser MD, Tinnie Gens.  INR POC: 1.5.  Cuvette Lot#: [id number].  Exp: 01/2011.    Anticoagulation Management Assessment/Plan:      The patient's current anticoagulation dose is Warfarin sodium 4 mg tabs: Use as directed by Anticoagulation Clinic.  The target INR is 2.0-3.0.  The next INR is due 11/01/2009.  Anticoagulation instructions were given to patient.  Results were reviewed/authorized by Weston Brass, PharmD.  She was notified by Weston Brass, PharmD.         Prior Anticoagulation Instructions: INR 2.7 Continue 2mg s everyday except 4mg s on Mondays, Wednesdays and Fridays. Recheck in 4 weeks. Pt will be back in [place], appt s/c at office.   Current Anticoagulation Instructions: INR 1.5  Take 1 1/2 tablets today then resume same dose of 1/2 tablet every day except 1 tablet on Monday, Wednesday and Friday.

## 2010-05-19 ENCOUNTER — Encounter: Payer: Self-pay | Admitting: Internal Medicine

## 2010-05-19 DIAGNOSIS — Z7901 Long term (current) use of anticoagulants: Secondary | ICD-10-CM

## 2010-05-19 DIAGNOSIS — I4891 Unspecified atrial fibrillation: Secondary | ICD-10-CM

## 2010-05-25 NOTE — Medication Information (Signed)
Summary: Coumadin Clinic  Anticoagulant Therapy  Managed by: Cloyde Reams, RN, BSN Referring MD: Rollene Rotunda MD PCP: Sharlet Salina, MD Supervising MD: Tenny Craw MD, Gunnar Fusi Indication 1: Atrial Fibrillation (ICD-427.31) Lab Used: [facility] Site: [facility] INR POC 2.1 INR RANGE 2.0-3.0  Dietary changes: no    Health status changes: no    Bleeding/hemorrhagic complications: no    Recent/future hospitalizations: no    Any changes in medication regimen? no    Recent/future dental: no  Any missed doses?: no       Is patient compliant with meds? yes       Allergies: No Known Drug Allergies  Anticoagulation Management History:      The patient is taking warfarin and comes in today for a routine follow up visit.  Positive risk factors for bleeding include an age of 75 years or older and presence of serious comorbidities.  The bleeding index is 'intermediate risk'.  Positive CHADS2 values include History of HTN and Age > 2 years old.  The start date was 09/17/2002.  Her last INR was 2.76.  Anticoagulation responsible provider: Tenny Craw MD, Gunnar Fusi.  INR POC: 2.1.  Cuvette Lot#: [id number].  Exp: 04/2011.    Anticoagulation Management Assessment/Plan:      The patient's current anticoagulation dose is Warfarin sodium 4 mg tabs: Use as directed by Anticoagulation Clinic.  The target INR is 2.0-3.0.  The next INR is due 06/16/2010.  Anticoagulation instructions were given to patient.  Results were reviewed/authorized by Cloyde Reams, RN, BSN.  She was notified by Cloyde Reams RN.         Prior Anticoagulation Instructions: INR 2.6  Coumadin 4 mg tablets - Continue 1/2 tablet every day except 1 tablet on Mondays, Wednesdays and Fridays   Current Anticoagulation Instructions: INR 2.1  Continue on same dosage 1/2 tablet daily except 1 tablet on Mondays, Wednesdays, and Fridays.  Recheck in 4 weeks.

## 2010-05-30 ENCOUNTER — Telehealth: Payer: Self-pay | Admitting: Internal Medicine

## 2010-06-01 ENCOUNTER — Encounter: Payer: Self-pay | Admitting: Cardiology

## 2010-06-01 DIAGNOSIS — I4892 Unspecified atrial flutter: Secondary | ICD-10-CM

## 2010-06-01 DIAGNOSIS — I4891 Unspecified atrial fibrillation: Secondary | ICD-10-CM

## 2010-06-01 DIAGNOSIS — Z7901 Long term (current) use of anticoagulants: Secondary | ICD-10-CM | POA: Insufficient documentation

## 2010-06-14 ENCOUNTER — Encounter: Payer: Self-pay | Admitting: Cardiovascular Disease

## 2010-06-21 NOTE — Medication Information (Signed)
Summary: Coumadin Clinic  Anticoagulant Therapy  Managed by: Bethena Midget, RN, BSN Referring MD: Rollene Rotunda MD PCP: Sharlet Salina, MD Supervising MD: Clifton James MD, Cristal Deer Indication 1: Atrial Fibrillation (ICD-427.31) Lab Used: [facility] Site: [facility] INR POC 2.1 INR RANGE 2.0-3.0  Dietary changes: no    Health status changes: no    Bleeding/hemorrhagic complications: no    Recent/future hospitalizations: no    Any changes in medication regimen? no    Recent/future dental: no  Any missed doses?: no       Is patient compliant with meds? yes      Comments: Pt in [place]  Allergies: No Known Drug Allergies  Anticoagulation Management History:      Her anticoagulation is being managed by telephone today.  Positive risk factors for bleeding include an age of 75 years or older and presence of serious comorbidities.  The bleeding index is 'intermediate risk'.  Positive CHADS2 values include History of HTN and Age > 79 years old.  The start date was 09/17/2002.  Her last INR was 2.76.  Anticoagulation responsible provider: Clifton James MD, Cristal Deer.  INR POC: 2.1.    Anticoagulation Management Assessment/Plan:      The patient's current anticoagulation dose is Warfarin sodium 4 mg tabs: Use as directed by Anticoagulation Clinic.  The target INR is 2.0-3.0.  The next INR is due 07/12/2010.  Anticoagulation instructions were given to patient.  Results were reviewed/authorized by Bethena Midget, RN, BSN.  She was notified by Bethena Midget, RN, BSN.         Prior Anticoagulation Instructions: INR 2.1  Continue on same dosage 1/2 tablet daily except 1 tablet on Mondays, Wednesdays, and Fridays.  Recheck in 4 weeks.   Current Anticoagulation Instructions: INR 2.1  Continue 2mg s daily except 4mg  on Mondays, Wednesdays and Fridays. Recheck in 4 weeks.

## 2010-06-21 NOTE — Progress Notes (Signed)
Summary: pt will do transmission late   Phone Note Call from Patient   Caller: Patient Reason for Call: Talk to Nurse, Talk to Doctor Summary of Call: pt will be out of town until around [place] and I told her it would be fine for her do her transmission when she gets back Initial call taken by: Omer Jack,  May 30, 2010 2:42 PM

## 2010-07-07 ENCOUNTER — Ambulatory Visit (INDEPENDENT_AMBULATORY_CARE_PROVIDER_SITE_OTHER): Payer: Medicare Other | Admitting: *Deleted

## 2010-07-07 DIAGNOSIS — Z950 Presence of cardiac pacemaker: Secondary | ICD-10-CM

## 2010-07-07 DIAGNOSIS — R0989 Other specified symptoms and signs involving the circulatory and respiratory systems: Secondary | ICD-10-CM

## 2010-07-07 DIAGNOSIS — I495 Sick sinus syndrome: Secondary | ICD-10-CM

## 2010-07-11 ENCOUNTER — Other Ambulatory Visit: Payer: Self-pay | Admitting: Internal Medicine

## 2010-07-11 NOTE — Progress Notes (Signed)
Pacer remote check  

## 2010-07-12 ENCOUNTER — Ambulatory Visit (INDEPENDENT_AMBULATORY_CARE_PROVIDER_SITE_OTHER): Payer: Medicare Other | Admitting: Internal Medicine

## 2010-07-12 ENCOUNTER — Ambulatory Visit (INDEPENDENT_AMBULATORY_CARE_PROVIDER_SITE_OTHER): Payer: Medicare Other | Admitting: *Deleted

## 2010-07-12 DIAGNOSIS — I4892 Unspecified atrial flutter: Secondary | ICD-10-CM

## 2010-07-12 DIAGNOSIS — Z7901 Long term (current) use of anticoagulants: Secondary | ICD-10-CM

## 2010-07-12 DIAGNOSIS — I4891 Unspecified atrial fibrillation: Secondary | ICD-10-CM

## 2010-07-12 DIAGNOSIS — M1990 Unspecified osteoarthritis, unspecified site: Secondary | ICD-10-CM

## 2010-07-12 DIAGNOSIS — I871 Compression of vein: Secondary | ICD-10-CM

## 2010-07-12 LAB — POCT INR: INR: 1.8

## 2010-07-26 ENCOUNTER — Encounter: Payer: Medicare Other | Admitting: Physician Assistant

## 2010-07-26 ENCOUNTER — Encounter: Payer: Medicare Other | Admitting: *Deleted

## 2010-07-26 ENCOUNTER — Ambulatory Visit (INDEPENDENT_AMBULATORY_CARE_PROVIDER_SITE_OTHER): Payer: Medicare Other | Admitting: Physician Assistant

## 2010-07-26 ENCOUNTER — Encounter: Payer: Self-pay | Admitting: Physician Assistant

## 2010-07-26 VITALS — BP 158/70 | HR 64 | Ht 66.0 in | Wt 154.4 lb

## 2010-07-26 DIAGNOSIS — I4891 Unspecified atrial fibrillation: Secondary | ICD-10-CM

## 2010-07-26 DIAGNOSIS — I10 Essential (primary) hypertension: Secondary | ICD-10-CM

## 2010-07-26 DIAGNOSIS — Z01810 Encounter for preprocedural cardiovascular examination: Secondary | ICD-10-CM

## 2010-07-26 DIAGNOSIS — Z950 Presence of cardiac pacemaker: Secondary | ICD-10-CM

## 2010-07-26 NOTE — Patient Instructions (Signed)
Your physician recommends that you schedule a follow-up appointment in: PLEASE MAKE AN APPT TO SEE DR. [facility] IN July 2012.  Your physician recommends that you continue on your current medications as directed. Please refer to the Current Medication list given to you today.

## 2010-07-26 NOTE — Assessment & Plan Note (Signed)
She does not have a h/o stroke. She should be able to hold her coumadin prior to her procedure and resume post operatively when felt to be safe.

## 2010-07-26 NOTE — Assessment & Plan Note (Signed)
Somewhat elevated today.  This has looked better in the past.  Continue same medicines and follow.

## 2010-07-26 NOTE — Progress Notes (Signed)
History of Present Illness: Primary Cardiologist:  Dr. Rollene Rotunda Primary Electrophysiologist:  Dr. Trey Sailors [name] is a 77 y.o. female with a h/o Atrial flutter, status post prior ablation, atrial fibrillation, on amiodarone and Coumadin, hypertension and hypothyroidism who presents for surgical clearance.  She is being referred to an orthopedist for possible left TKR.  She is not sure of the name.  She has been limited by her knee DJD for a few years now.  However, she remains as active as she can be.  She can vacuum a room in her house, do dishes and laundry and shop at the store without chest pain or dyspnea.  She does have to stop sometimes because she is tired.  She denies syncope, orthopnea, PND or significant edema.    Past Medical History  Diagnosis Date  . Symptomatic sinus bradycardia     s/p pacemaker  . Atrial flutter     s/p RFCA  . Atrial fibrillation   . Hypothyroidism   . GERD (gastroesophageal reflux disease)   . Esophageal stricture   . Anemia   . HTN (hypertension)     a.  echo 3/06: EF 55-65%, mild LVH, mild RVH  . Asthma     Current Outpatient Prescriptions  Medication Sig Dispense Refill  . ALPRAZolam (XANAX) 0.5 MG tablet Take 0.5 mg by mouth at bedtime as needed.        Marland Kitchen amiodarone (PACERONE) 200 MG tablet Take 200 mg by mouth daily. TAKE 1 TAB ON M, T, W, TH, F...Marland KitchenMarland KitchenON SAT AND SUN TAKE ONLY 1/2 TAB       . Cholecalciferol (VITAMIN D3) 1000 UNITS CAPS Take 1 capsule by mouth daily.        . cycloSPORINE (RESTASIS) 0.05 % ophthalmic emulsion 1 drop 2 (two) times daily.        Marland Kitchen esomeprazole (NEXIUM) 40 MG capsule Take 40 mg by mouth daily before breakfast.        . ferrous sulfate 325 (65 FE) MG tablet Take 325 mg by mouth daily with breakfast.        . levothyroxine (SYNTHROID, LEVOTHROID) 88 MCG tablet Take 88 mcg by mouth daily.        . metoprolol (LOPRESSOR) 50 MG tablet Take 50 mg by mouth daily.        . Multiple Vitamins-Minerals (ICAPS)  CAPS Take 1 capsule by mouth daily.        Marland Kitchen triamterene-hydrochlorothiazide (MAXZIDE-25) 37.5-25 MG per tablet Take 1 tablet by mouth daily.        Marland Kitchen warfarin (COUMADIN) 4 MG tablet Take 4 mg by mouth as directed.          Allergies not on file  History  Substance Use Topics  . Smoking status: Former Games developer  . Smokeless tobacco: Not on file  . Alcohol Use: Not on file    ROS:  See HPI.  No fevers, chills, cough, melena, dysuria.  All other systems reviewed and negative.  Vital Signs: BP 158/70  Pulse 64  Ht 5\' 6"  (1.676 m)  Wt 154 lb 6.4 oz (70.035 kg)  BMI 24.92 kg/m2  PHYSICAL EXAM: Well nourished, well developed, in no acute distress HEENT: normal Neck: no JVD Vascular: no carotid bruits Endocrine: no thyromegaly Cardiac:  normal S1, S2; RRR; no murmur Lungs:  clear to auscultation bilaterally, no wheezing, rhonchi or rales Abd: soft, nontender, no hepatomegaly Ext: trace bilateral edema Skin: warm and dry Neuro:  CNs 2-12  intact, no focal abnormalities noted Psych: normal affect  EKG:  Atrial paced, heart rate 64, normal axis, and nonspecific ST-T wave changes  ASSESSMENT AND PLAN:

## 2010-07-26 NOTE — Assessment & Plan Note (Signed)
She has had her pacer interrogated recently and it is functioning appropriately.  The pacemaker representative can be contacted in the perioperative period if there are any unforseen issues.

## 2010-07-26 NOTE — Assessment & Plan Note (Signed)
She does not have any unstable cardiac conditions.  She is able to achieve 4 METS without angina or dyspnea.  She had normal LVF by echo in 2006.   According to ACC/AHA guidelines, she does not require any further cardiovascular testing prior to her noncardiac surgery.  She should be at acceptable risk. Her risk is likely mild to moderate given her advanced age for complications.  Our service will be available as necessary.

## 2010-07-31 ENCOUNTER — Encounter: Payer: Self-pay | Admitting: *Deleted

## 2010-08-09 ENCOUNTER — Ambulatory Visit (INDEPENDENT_AMBULATORY_CARE_PROVIDER_SITE_OTHER): Payer: Medicare Other | Admitting: *Deleted

## 2010-08-09 DIAGNOSIS — I4892 Unspecified atrial flutter: Secondary | ICD-10-CM

## 2010-08-09 DIAGNOSIS — Z7901 Long term (current) use of anticoagulants: Secondary | ICD-10-CM

## 2010-08-09 DIAGNOSIS — I4891 Unspecified atrial fibrillation: Secondary | ICD-10-CM

## 2010-08-09 LAB — POCT INR: INR: 2.1

## 2010-08-16 NOTE — Assessment & Plan Note (Signed)
[facility]                            CARDIOLOGY OFFICE NOTE   ANWITHA, MAPES                        MRN:          [id number]  DATE:03/07/2007                            DOB:          27-Dec-1921    PRIMARY CARE PHYSICIAN:  Dr. Eden Emms Baxley.   REASON FOR PRESENTATION:  Evaluate patient with atrial fibrillation and  tachybrady syndrome.   HISTORY OF PRESENT ILLNESS:  The patient is 77 years old.  Returns for  followup.  She has done well.  She has not had any syncope.  She is  continuing to have fatigue as a predominant complaint.  She says that  generalized decreased exercise tolerance has been slowly progressive.  She has had no overt symptoms.  She denies any palpitations.  She has  had no presyncope.  She has had no chest discomfort, neck or arm  discomfort.  She has had no shortness of breath.  She has a little  trouble with her balance.   PAST MEDICAL HISTORY:  Atrial flutter status post paroxysmal atrial  ablation, tachybrady syndrome status post pacemaker placement, chronic  anticoagulation, hypertension, asthma, gastroesophageal reflux disease,  esophageal stricture, diverticulosis, tremor.   ALLERGIES:  None.   MEDICATIONS:  1. Nexium 40 mg daily.  2. Ocuvite.  3. Ditropan XL 10 mg daily.  4. Coumadin.  5. Foltx.  6. Alprazolam.  7. Amiodarone 200 mg daily.  8. Metoprolol 50 mg daily.  9. Triamterene/hydrochlorothiazide 37.5/25 daily.  10.Synthroid 88 mcg daily.  11.Altace 2.5 mg daily.   REVIEW OF SYSTEMS:  As stated in the HPI, and, otherwise, negative for  other systems.   PHYSICAL EXAMINATION:  GENERAL:  The patient is no distress.  VITAL SIGNS:  Blood pressure 157/74, heart rate 62 and regular, weight  153 pounds, body mass index 26.  HEENT:  Eyes unremarkable, pupils equal, round, and reactive to light.  Fundi is not visualized.  Oral mucosa unremarkable.  NECK:  No jugular venous distention at 45 degrees.   Carotid upstroke  brisk and symmetric, no bruits, no thyromegaly.  LYMPHATICS:  No lymphadenopathy.  LUNGS:  Clear to auscultation bilaterally.  BACK:  No costovertebral angle tenderness.  CHEST:  Well-healed pacemaker pocket.  HEART:  PMI not displaced or sustained, S1, S2 within normal limits, no  S3, no S4.  No clicks, no rubs, no murmurs.  ABDOMEN:  Slightly tender with positive bowel sounds normal in frequency  and pitch.  No bruits, no rebound, no guarding, no midline pulsatile  mass, no hepatomegaly, no splenomegaly.  SKIN:  No rash or nodules.  EXTREMITIES:  2+ pulses, left femoral bruit.  No cyanosis, no clubbing,  no edema.  NEUROLOGICAL:  Oriented to person, place, and time.  Cranial nerves II-  XII are grossly intact.  Motor grossly intact.   EKG:  Atrial paced rhythm, axis within normal limits, intervals within  normal limits.  No acute ST wave change.   ASSESSMENT/PLAN:  1. Atrial fibrillation.  The patient is doing well with respect to      this.  At this  point she will continue the Coumadin.  We are going      to check the usual labs with her amiodarone including thyroid-      stimulating hormone, liver profile.  She is also due for repeat      pulmonary function testing.  She understands the need to get      ophthalmic exams.  2. Status post pacemaker placement.  She is due to have this followed      up in the summer.  I will see her back in six months, and will do      an in office pacemaker followup at that time.  3. Followup.  I will see her back in six months as above.  She will      continue to follow in our Coumadin clinic.     Rollene Rotunda, MD, [facility]  Electronically Signed    JH/MedQ  DD: 03/07/2007  DT: 03/07/2007  Job #: [id number]   cc:   Luanna Cole. Lenord Fellers, M.D.

## 2010-08-16 NOTE — Assessment & Plan Note (Signed)
[facility]                         ELECTROPHYSIOLOGY OFFICE NOTE   NAME:[name]                        MRN:          [id number]  DATE:11/26/2007                            DOB:          Apr 30, 1921    [name] returns today for followup.  She is a very pleasant elderly  woman with a history of paroxysmal atrial fibrillation as well as a  history of atrial flutter status post ablation, history of symptomatic  bradycardia and chronic amiodarone therapy.  The patient returns today  for followup.  She has been stable but does have fatigue and weakness  with exertion.  She is also bothered mostly by arthritis in her lower  extremities particularly in her knees.   MEDICINES:  Today include,  1. Nexium 40 a day.  2. Multiple vitamins.  3. Coumadin as directed.  4. Amiodarone 200 mg daily.  5. Alprazolam 0.25 twice daily.  6. Triamterene/HCTZ 37.5/12.5 daily.  7. Synthroid 88 mcg daily.  8. Altace 2.5 daily.  9. Restasis b.i.d., dose unknown.   PHYSICAL EXAMINATION:  GENERAL:  She is a pleasant elderly woman in no  distress.  VITAL SIGNS:  Blood pressure today was 138/64, the pulse was 64 and  regular, respirations were 18.  Weight was 145 pounds NECK:  No jugular  distention.  LUNGS:  Clear bilaterally to auscultation.  No wheezes, rales, or  rhonchi are present.  CARDIOVASCULAR:  Regular rate and rhythm.  Normal S1 and S2.  ABDOMINAL:  Soft, nontender.  EXTREMITIES:  Demonstrated trace peripheral edema bilaterally.   Interrogation of her pacemaker demonstrates a Medtronic and rhythm of P-  waves as 3, the R waves as 9, the impedance 672 in A and 376 in the RV.  The threshold volt of 0.4 in the A and 1.5 of a 0.4 in the RV.  Battery  voltage was 2.88 volts.  Underlying rhythm was sinus brady at 50.  She  was 97% A paced and 0.5% V paced.   IMPRESSION:  1. Symptomatic bradycardia.  2. Paroxysmal atrial fibrillation.  3. Hypertension.  4.  Status post dual-chamber pacemaker insertion.   DISCUSSION:  [name] is stable.  Her pacemaker is working normally.  We  will plan to see her back for pacemaker followup in 1 year.  She will  continue on her amiodarone therapy for now at low dose.     Doylene Canning. Ladona Ridgel, MD  Electronically Signed    GWT/MedQ  DD: 11/26/2007  DT: 11/27/2007  Job #: [id number]

## 2010-08-16 NOTE — Assessment & Plan Note (Signed)
[facility]                            CARDIOLOGY OFFICE NOTE   [name], [name]                        MRN:          [id number]  DATE:10/28/2007                            DOB:          17-Dec-1921    PRIMARY CARE PHYSICIAN:  Luanna Cole. Baxley, MD.   REASON FOR PRESENTATION:  Evaluate the patient with tachybrady syndrome  and atrial fibrillation.   HISTORY OF PRESENT ILLNESS:  The patient is a lovely 77 year old who  returns for 47-month followup.  Since I last saw her, she has had no  tachy palpitations, presyncope or syncope.  She has had no new dyspnea.  She does complain of tiredness.  This has been a chronic complaint  perhaps slowly progressive.  She has some occasional numbness in her  feet.  She does not have any chest pressure, neck or arm discomfort.  She does not have any severe shortness of breath and has no PND or  orthopnea.   Of note, in December, the patient did have some mildly abnormal  pulmonary function test.  I have discussed this with Dr. Maple Hudson.  It was  suggested that she have a noncontrasted CT and follow up with him if  there are any abnormalities on this.  This has not yet been scheduled.   PAST MEDICAL HISTORY:  Atrial flutter status post ablation, tachybrady  syndrome status post pacemaker placement, atrial fibrillation with  chronic anticoagulation therapy, hypertension, asthma, gastroesophageal  reflux disease, esophageal stricture, diverticulosis and tremor.   ALLERGIES:  MORPHINE.   MEDICATIONS:  1. Nexium 40 mg daily.  2. Ocuvite.  3. Ditropan XL 10 mg daily.  4. Coumadin.  5. Foltx.  6. Alprazolam 0.25 mg b.i.d.  7. Amiodarone 200 mg daily.  8. Metoprolol 50 mg daily.  9. Triamterene and hydrochlorothiazide 37.5/25 daily.  10.Synthroid 88 mcg daily.  11.Altace 2.5 mg daily.   REVIEW OF SYSTEMS:  As stated in the HPI, otherwise negative for other  systems.   PHYSICAL EXAMINATION:  GENERAL:  The patient  is in no distress.  VITAL SIGNS:  Blood pressure 117/62, heart rate 75 and regular, weight  148 pounds, body mass index 25.  HEENT:  Eyes are unremarkable, pupils equal, round and reactive to  light, fundi not visualized, oral mucosa unremarkable.  NECK:  No jugular distention at 45 degrees.  Carotid upstroke brisk and  symmetrical. No bruits or thyromegaly.  LYMPHATICS:  No adenopathy.  LUNGS:  Clear to auscultation without wheezing or crackles.  BACK: No costovertebral angle tenderness.  CHEST:  Well-healed pacemaker pocket.  HEART: PMI not displaced or sustained. S1 and S2 are within normal  limits. No S3, no S4. No clicks, no rubs, no murmurs.  ABDOMEN:  Flat, positive bowel sounds, normal in frequency and pitch. No  bruits, no rebound, no guarding. No midline pulsatile mass. No  hepatomegaly.  No splenomegaly.  SKIN:  No rashes, no nodules.  EXTREMITIES:  2+ pulses throughout, left femoral bruit, no cyanosis, no  clubbing, no edema.  NEURO:  Oriented to person, place and time, cranial nerves II-XII  grossly intact.   EKG atrial paced rhythm, axis within normal limits, intervals within  normal, limits, no acute ST-wave changes.   Pacemaker interrogation, atrial amplitude 2.1, impedance 640, threshold  0.5 and 0.4, right ventricular amplitude 5.1, impedance 360, threshold  1.5 and 0.4, and battery voltage 2.96, the patient has had some very  short episodes of apparent atrial tachycardia with 12 episodes of mode  switching but none prolonged.  She is a paced 96.6% of the time.  The  mode is DDDR.  There were no changes.   ASSESSMENT AND PLAN:  1. Atrial fibrillation.  The patient is having few mode switches      indicating some atrial fibrillation, but it seems to be much better      controlled.  We doubt that there is any flutter.  I would like her      to continue on the amiodarone.  We will get TSH and liver profile      as it has been 6 months.  I will follow up with a  noncontrast CT of      her chest given the slight diffusion capacity abnormality on her      recent pulmonary function tests.  If there is any abnormality in      the CT she will follow with Dr. Maple Hudson.  Otherwise we will do      surveillance pulmonary function testing.  She is not having any      overt dyspnea.  She does get her eyes checked twice a year.  She      has some mild peripheral neuropathy, but no evidence of other      neuropathy they might be associated with amiodarone.  I will follow      this clinically.  2. Status post pacemaker placement.  This was checked today and we      will do this as indicated in our clinic.  3. Fatigue.  I do not see any clear etiology to this.  This has been      slowly progressive problem.  No further cardiovascular testing is      suggested.  4. Hypertension.  Blood pressure is controlled and she will continue      on medications as listed.  5. Hypothyroidism.  She continues on thyroid replacement and again we      will check a TSH as      part of amiodarone surveillance.  6. Followup.  We will see her back in 6 months or sooner if needed.     Rollene Rotunda, MD, [facility]  Electronically Signed    JH/MedQ  DD: 10/28/2007  DT: 10/29/2007  Job #: [id number]   cc:   Luanna Cole. Lenord Fellers, M.D.

## 2010-08-16 NOTE — Assessment & Plan Note (Signed)
[facility]                         ELECTROPHYSIOLOGY OFFICE NOTE   Michaela, Silva                        MRN:          [id number]  DATE:10/16/2006                            DOB:          [birth year]    [name] returns today for followup.  She is a very pleasant elderly  woman with the history of symptomatic bradycardia, paroxysmal atrial  fibrillation, status post pacemaker insertion, and returns today for  followup.  She notes that her main complaint today is that she gets  fatigued with exertion.  This is associated with some shortness of  breath, which is fairly mild.  There is no chest pain, neck or jaw pain.  She denies any syncope.   PHYSICAL EXAM:  She is a pleasant, well-appearing, elderly woman, in no  acute distress.  The blood pressure was 134/68, the pulse was 76 and  regular, respirations were 18, the weight was 156 pounds.  NECK:  The neck revealed no jugular venous distention.  LUNGS:  Clear bilaterally to auscultation.  No wheezes, rales or rhonchi  were present.  CARDIOVASCULAR EXAM:  Revealed a regular rate and rhythm with normal S1  and S2.  EXTREMITIES:  Demonstrated no cyanosis, clubbing or edema.  The pulses  were 2+ and symmetric.   Interrogation of her pacemaker demonstrates a Medtronic EnRhythm with P  and R-waves of 3 and 8 with impedance 704 in the atrium, 360 in the  ventricle, the threshold 0.5 at 0.4 in the atrium and 1.5 at 0.4 in the  right ventricle.  Battery voltage is 3 volts.  There were 49 remote  switching episodes, the longest of which was 10 minutes.   MEDICATIONS INCLUDE:  1. Nexium.  2. Ditropan.  3. Coumadin as directed.  4. Alprazolam.  5. Amiodarone 200 a day.  6. Metoprolol.   IMPRESSION:  1. Symptomatic bradycardia.  2. Paroxysmal atrial fibrillation.  3. Status post pacemaker insertion.  4. Hypertension.   DISCUSSION:  Overall, [name] is stable.  I have encouraged her to  continue  her activities as vigorously as possible.  She does note that  she gets fatigued at times, but I have recommended  that she continue walking and doing whatever strenuous activity she  feels like.  We will see her back in the office in one year.  She will  follow up in our [facility].     Doylene Canning. Ladona Ridgel, MD  Electronically Signed    GWT/MedQ  DD: 10/16/2006  DT: 10/17/2006  Job #: [id number]   cc:   Luanna Cole. Lenord Fellers, M.D.

## 2010-08-19 NOTE — Consult Note (Signed)
NAME:  [name], MUHA NO.:  [id number]   MEDICAL RECORD NO.:  [id number]          PATIENT TYPE:  INP   LOCATION:  4729                         FACILITY:  [facility]   PHYSICIAN:  Doylene Canning. Ladona Ridgel, M.D.  DATE OF BIRTH:  06-23-1921   DATE OF CONSULTATION:  06/29/2004  DATE OF DISCHARGE:                                   CONSULTATION   [name] is admitted today for tachy palpitations.  She has a remote history  of atrial fibrillation in the past and has been seen by Dr. Antoine Poche.  She  is a primary patient of Dr. Beryle Quant.  She was recently seen in the office  by Dr.  Antoine Poche and had her dose of Metoprolol increased to 75 mg daily.  She has continued to have dizzy spells.  She also has palpitations.  These  worsened today and she subsequently presented to the hospital where she was  found to be in atrial flutter with an increased ventricular response.  The  patient has had a history in the past of tachy-brady syndrome and frequent  PACs.  She, otherwise, denies any syncope.   PAST MEDICAL HISTORY:  Notable for permanent pacemaker implanted back in  October for tachy-brady syndrome.  This was complicated by pneumothorax with  subsequent chest tube implantation.  She has a history of hypertension,  history of esophageal stricture, history of diverticulosis, history of  gastroesophageal reflux disease.   FAMILY HISTORY:  Noncontributory.   SOCIAL HISTORY:  The patient denies tobacco and ethanol use.   REVIEW OF SYMPTOMS:  Notable for shortness of breath and dizziness.  She  denies nausea, vomiting, diarrhea, or constipation.  She denies polyuria or  polydipsia.  She denies heat or cold intolerance.   PHYSICAL EXAMINATION:  GENERAL:  Pleasant, elderly woman in no distress.  VITAL SIGNS:  Blood pressure today was 136/68, pulse initially 102 in atrial  flutter, repeat pulse in sinus rhythm was 62 beats per minute.  Respirations  were 18.  Oxygen saturation was 98%.   Temperature 97.2.  HEENT:  Normocephalic, atraumatic, pupils equal and round, oropharynx moist,  sclerae anicteric.  NECK:  No jugular venous distention, there is no thyromegaly, and the  trachea was midline.  CARDIOVASCULAR EXAM:  Regular rate and rhythm with normal S1 and S2.  EXTREMITIES:  No edema.  NEUROLOGICAL:  Alert and oriented x 3, cranial nerves intact, strength 5/5  and symmetric.   LABORATORY DATA:  EKG demonstrates initially atrial flutter with a rapid  ventricular response.  This flutter was typical atrial flutter with positive  flutter waves in lead V1 and negative flutter waves in the inferior leads.  Subsequent EKG demonstrates sinus rhythm at 60 beats per minute.  There  appears to be atrial pacing with ventricular fusion.   IMPRESSION:  1.  History of tachy-brady syndrome.  2.  Status post pacemaker insertion.  3.  Atrial flutter.  4.  Remote history of atrial fibrillation.   DISCUSSION:  I discussed the treatment options with Ms. Glazebrook.  I have  recommended electrophysiologic study and catheter ablation  of her atrial  flutter.  The risks, benefits, goals, and expectations have been discussed  with the patient and she wishes to proceed.      GWT/MEDQ  D:  06/29/2004  T:  06/29/2004  Job:  [id number]   cc:   Luanna Cole. Lenord Fellers, M.D.  [address] [place]  [place]  [place] [address]  Fax: [phone]

## 2010-08-19 NOTE — Discharge Summary (Signed)
NAME:  SHALEA, TOMCZAK NO.:  [id number]   MEDICAL RECORD NO.:  [id number]          PATIENT TYPE:  INP   LOCATION:  4729                         FACILITY:  [facility]   PHYSICIAN:  Rollene Rotunda, M.D.   DATE OF BIRTH:  September 12, 1921   DATE OF ADMISSION:  06/29/2004  DATE OF DISCHARGE:                                 DISCHARGE SUMMARY   ADDENDUM:   LABORATORY STUDIES:  PT/INR on June 29, 2004: PT was 23.6.  INR was 2.9.  On June 30, 2004, PT was 22.4.  INR was 2.7.  On July 01, 2004, PT was 21.  INR was 2.4.  Fasting lipid profile was obtained this hospitalization.  Cholesterol 213, triglycerides 349, HDL cholesterol 36, LDL cholesterol 107.  Serum electrolytes: Sodium 138, potassium 4.1, chloride 107, bicarbonate 23,  BUN 13, creatinine 1.4, glucose 122.  Complete blood count: White cells 6.9,  hemoglobin 11.2, hematocrit 31.7, platelets 214.      GM/MEDQ  D:  07/01/2004  T:  07/01/2004  Job:  [id number]   cc:   Doylene Canning. Ladona Ridgel, M.D.   Luanna Cole. Lenord Fellers, M.D.  [address] [place]  [place]  [place] [address]  Fax: [phone]   Rollene Rotunda, M.D.

## 2010-08-19 NOTE — H&P (Signed)
NAME:  Michaela, Silva NO.:  [id number]   MEDICAL RECORD NO.:  [id number]          PATIENT TYPE:  INP   LOCATION:  2906                         FACILITY:  [facility]   PHYSICIAN:  Luanna Cole. Lenord Fellers, M.D.   DATE OF BIRTH:  [birth year]   DATE OF ADMISSION:  12/21/2003  DATE OF DISCHARGE:  12/23/2003                                HISTORY & PHYSICAL   CHIEF COMPLAINT:  Dizzy.   HISTORY OF PRESENT ILLNESS:  This 77 year old white female presented to the  office on December 21, 2003 complaining of dizziness for more than one  week.  She has a history of chronic recurrent benign positional vertigo but  has had no attacks of that in some 16 months.  She has felt nauseated but  has had no vomiting.  About two weeks ago, she returned from a 200 mile road  trip in which she drove to see her sister in [place].  She was quite  tired after getting home and then the dizziness started.  She has taken  Xanax 0.25 mg h.s. regularly at my suggestion for the past 16 months and has  had less frequent vertigo attacks.  She has an essential tremor, history of  paroxysmal atrial fibrillation for which she is on chronic Coumadin therapy  and that is monitored by the [facility] staff.  She has GE reflux,  degenerative joint disease of the knees, hypertension, macular degeneration,  stress urinary incontinence, diverticulosis.  The patient exhibited at the  time of the admission, sinus rhythm with a rate of 65 in my office but she  had significant pauses with rate in the low 40's when I placed her in a  reclining position.  I initially thought she just had her usual recurrent  positional vertigo but we obtained an EKG because of her low pulse rate.  It  was showing a definite arrhythmia and I believe the symptoms are  contributing to her fatigue and weakness.  There are also definite  positional vertigo symptoms so it Boody be she has two different disease  processes going on at the  present time.  She has no chest pain, pressure or  palpitations.  She complained of diarrhea the week before admission, some 3-  5 bowel movements per day.  She said she was very nauseated two days before  admission but no vomiting.  She says she is worried about her son who Caine be  laid off of his job at [employer] in [place].  The patient lives  alone.  I admitted her to the hospital for further evaluation and cardiac  consultation.  She remains stable in the office and she is alert and  oriented x3.   PAST MEDICAL HISTORY:  No known drug allergies.   CURRENT MEDICATIONS:  1.  Coumadin 5 mg Monday, Wednesday, Friday and 2.5 mg Tuesday, Thursday and      Saturday.  2.  Maxzide 25, one p.o. daily.  3.  Altace 2.5 mg daily.  4.  Ditropan XL 10 mg daily.  5.  Nexium  40 mg daily.  6.  Ocuvite, (vitamins), for macular degeneration.  7.  Inderal 20 mg p.o. b.i.d.  8.  Xanax 0.25 mg h.s. and up to t.i.d. p.r.n. vertigo symptoms.   SOCIAL HISTORY:  She is a retired Furniture conservator/restorer.  Her husband  died over 10 years ago and he had diabetes and atherosclerosis.  She has one  daughter who lives locally and one son in [place].  She has a sister in  [place] whom she drives alone to visit frequently.  She had an MRI as an  outpatient for benign vertigo and has seen a neurologist.  She was diagnosed  by the neurologist for benign vertigo and vertebral basilar insufficiency.   FAMILY HISTORY:  See 2001 admission.   REVIEW OF SYSTEMS:  The patient is hard of hearing and wears bilateral  hearing aids.   PHYSICAL EXAMINATION:  VITAL SIGNS:  She is afebrile.  Blood pressure  134/70, pulse is 65 but drops into the 40's.  SKIN:  Pale, warm and dry.  LYMPH NODES:  None.  HEENT:  TM's are clear.  Pharynx is clear.  Head is normocephalic,  atraumatic.  NECK:  Supple.  No JVD.  No carotid bruits.  No thyromegaly.  CHEST:  Clear.  CARDIAC:  Distant soft heart sounds, irregular at  times.  No significant  murmur.  ABDOMEN:  No hepatosplenomegaly, masses appreciated.  EXTREMITIES:  No pitting edema.  NEUROLOGICAL:  She is alert and oriented x3 and brief exam is intact with  the exception of complaint of dizziness with change in position.   IMPRESSION:  1.  Cardiac dysrhythmia, ? sick sinus syndrome.  2.  History of paroxysmal atrial fibrillation.  3.  History of benign positional vertigo.  4.  History of gastroesophageal reflux.  5.  History of macular degeneration.  6.  History of hypertension.  7.  History of essential tremor.  8.  History of stress urinary incontinence.   PLAN:  The patient will be admitted to [facility] and will be monitored closely.  Adolph Pollack cardiology or regular cardiologist have been contacted for  consultation and we will proceed on their advisement.       MJB/MEDQ  D:  02/04/2004  T:  02/04/2004  Job:  [id number]

## 2010-08-19 NOTE — Op Note (Signed)
NAME:  MIQUELA, COSTABILE NO.:  [id number]   MEDICAL RECORD NO.:  [id number]          PATIENT TYPE:  INP   LOCATION:  3731                         FACILITY:  [facility]   PHYSICIAN:  Shan Levans, M.D. LHCDATE OF BIRTH:  1922/03/07   DATE OF PROCEDURE:  01/18/2004  DATE OF DISCHARGE:                                 OPERATIVE REPORT   CHIEF COMPLAINT:  Left pneumothorax, status post pacemaker placement.   OPERATOR:  Shan Levans, M.D.   ANESTHESIA:  1% Xylocaine local.   PREOP MEDICATION:  Morphine 4 mg IV push.   DESCRIPTION OF PROCEDURE:  The patient was prepped and draped in sterile  manner.  1% Xylocaine with local was used.  The fifth intercostal space in  the anterior midaxillary line was entered.  A rush of air ensued.  A #20  Argyle chest tube was placed.  Chest x-ray was pending at time of dictation.   COMPLICATIONS:  None.   IMPRESSION:  Left pneumothorax status post pacemaker placement.   RECOMMENDATIONS:  Follow-up chest tube placement.  Maintain him on 20 cm  suction.      Patr   PW/MEDQ  D:  01/18/2004  T:  01/19/2004  Job:  [id number]   cc:   Rollene Rotunda, M.D.

## 2010-08-19 NOTE — Discharge Summary (Signed)
NAME:  [name], [name] NO.:  [id number]   MEDICAL RECORD NO.:  [id number]          PATIENT TYPE:  INP   LOCATION:  3731                         FACILITY:  [facility]   PHYSICIAN:  Doylene Canning. Ladona Ridgel, M.D.  DATE OF BIRTH:  17-Nov-1921   DATE OF ADMISSION:  01/15/2004  DATE OF DISCHARGE:  01/22/2004                                 DISCHARGE SUMMARY   DISCHARGE DIAGNOSES:  1.  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  2.  Presyncope with documented sinus pauses.  3.  Paroxysms of atrial fibrillation, atrial flutter persistent through this      hospitalization.  4.  Supratherapeutic Coumadin on admission with an INR of 5.9, patient on      concurrent amiodarone therapy.  5.  Amiodarone therapy for atrial fibrillation/flutter discontinued now      concentrating on rate control.  6.  Status post implantation of Medtronic Enrhythm demand pacer [id number],      serial number [id number] on January 18, 2004.  7.  Left pneumothorax this admission requiring tube thoracostomy.      Pneumothorax resolving after 72 hours.  8.  Amiodarone discontinued.   SECONDARY DIAGNOSES:  1.  Hypertension.  2.  Remote asthma.  3.  Gastroesophageal reflux disease.  4.  Esophageal stricture.  5.  Diverticulosis.  6.  Tremor treated with propranolol.   PROCEDURE:  January 18, 2004, implantation of permanent pacemaker.  It is a  Medtronic Enrhythm [id number], serial [id number] by Rollene Rotunda, M.D.  The  patient did well, however, had a 30% pneumothorax post procedure requiring  tube thoracostomy.   DISPOSITION:  [name] is ready for discharge post procedure #4.  Her  Coumadin is therapeutic on discharge with an INR of 2.0.  Her pacemaker has  been interrogated and functioning normally.  The incision and pacer pocket  is healing nicely.  Chest x-ray which was obtained January 22, 2004, shows  no discernible left-sided pneumothorax, no  pulmonary edema or pleural fluid.  The leads for the permanent pacemaker are intact and in appropriate  position.  The patient has undergone thorough discussion of mobility issues  prior to discharge.  Also, the need to keep her incision dry for seven days  after implantation.   DISCHARGE MEDICATIONS:  1.  A new dose of Coumadin 5 mg tablets one half tablet Monday, Wednesday,      Friday and Sunday.  One tablet Tuesday, Thursday, Saturday.  It is felt      that this will result in a more stable INR for her.  2.  Nexium 40 mg daily.  3.  Metoprolol 50 mg one half tablet in the morning and one half tablet in      the evening.  This is for rate control and is a new dose.  This replaced      propranolol.  4.  Triamterene/hydrochlorothiazide 37.5/25 one tablet daily.  5.  Altace 2.5 mg daily.  6.  Ocuvite one tablet daily.  7.  Foltx one tablet daily.  8.  Xanax 0.25 mg daily at bedtime.  9.  Ditropan XL 10 mg daily.   Pain management with Tylenol as needed.   FOLLOW UP:  These appointments have been set up:  1.  Coumadin clinic Monday, January 25, 2004, at 11:45 in the morning.  At      that time, a thoracostomy tube stitch will be removed.  2.  Follow-up at the pacer clinic [facility] Thursday, February 04, 2004, at 9 o'clock.  3.  See Dr. Antoine Poche Tuesday, February 09, 2004, at 3:30; and then she will      see Dr. Antoine Poche in February 2006.  [place] office will call with that      appointment.   BRIEF HISTORY:  Mrs. Mcwhirt is a 77 year old female.  She is followed by Dr.  Doylene Canning. Ladona Ridgel, electrophysiologist at [facility].  She has a  history of atrial bigeminy and paroxysmal atrial fibrillation.  She has sick  sinus syndrome.  She was previously evaluated for pacemaker and it was not  felt indicated at that time.  She has been on amiodarone for her arrhythmia.  She was placed on a Holter monitor recently to evaluate her rhythm.  She  presents to the office on  January 15, 2004, with presyncope.  On the monitor  she was found to have multiple pauses of 3 to 3.6 seconds.  She was seen in  conjunction with Dr. Antoine Poche and the decision was made to admit the patient  for permanent pacemaker implantation.  The patient currently denies chest  pain.  She has some mild shortness of breath.  Her INR was checked and it  was elevated at 5.7.  Her Coumadin will be held prior to implantation.   HOSPITAL COURSE:  After admission from [facility] office to [facility]  [facility] on January 15, 2004.  All Coumadin was held.  Propranolol was discontinued and serial PT/INR studies were obtained with  the aim of reducing INR to therapeutic level and below prior to pacemaker  placement.  The morning that the pacemaker was implanted, her INR was 2.4.  Implantation of the pacemaker was done January 18, 2004.  The patient  tolerated the procedure well but did have a 30% left-sided pneumothorax.  A  pulmonary consult was obtained and a left thoracostomy tube was placed  without difficulty.  The pneumothorax had resolved entirely after 72 hours.  The tube was removed without residual pneumothorax.  The pacemaker was  interrogated the day after implantation and no changes were made to the  device.  The patient has had intermittent atrial fibrillation and flutter  since the implantation and rate control has been accomplished with  discontinuing amiodarone and substituting metoprolol 25 mg b.i.d.  The  patient goes home with the medications and follow-up as dictated.       GM/MEDQ  D:  01/22/2004  T:  01/22/2004  Job:  [id number]   cc:   Luanna Cole. Lenord Fellers, M.D.  [address], [place]  [place]  [place] [address]  Fax: [phone]   Rollene Rotunda, M.D.

## 2010-08-19 NOTE — Consult Note (Signed)
[place]  Patient:    [name], [name]                        MRN: [id number] Proc. Date: 05/10/99 Adm. Date:  [id number] Attending:  Sharlet Salina Dictator:   Darden Palmer., M.D. CC:         Luanna Cole. Lenord Fellers, M.D.             Peter M. [place], M.D.                          Consultation Report  HISTORY OF PRESENT ILLNESS:  The patient is a 77 year old female who has previously been in pretty good health.  She is a retired Runner, broadcasting/film/video, and was evaluated in 1996 by Dr. [place] with chest pain.  She walked only six minutes on the treadmill and had some J point depression with some nondiagnostic EKG changes.  She was seen again in June of that year and was found to have a right middle lobe infiltrate.  She has been followed by Dr. Lenord Fellers since then.  She is obese but has been able to do her normal work.  She has not felt well for about a week and described episodic indigestion.  It was not related to exertion.  In fact, she was able to rake leaves and do fairly heavy work.  She, however, would complain of episodic indigestion in her mid sternal area which would be worse at night or in the early evening.  It was not precipitated by food and was not associated with sweating.  It did not radiate to the arm but might radiate into the neck a little bit.  It would be relieved ith antacids and somewhat with belching.  Her last episode was the night before last. She saw Dr. Lenord Fellers in the office today and Dr. Lenord Fellers admitted her to the hospital to rule out an MI or unstable angina.  EKG was normal.  The patient complained f not feeling well today but this is nonspecifically diffuse.  She does not have ny exertional type of chest discomfort.  She describes this pain more an indigestion rather than pressure.  PAST MEDICAL HISTORY:  Benign.  There is no hypertension, diabetes, hyperlipidemia, or other risk factors for heart disease.  PAST  SURGICAL HISTORY:   Lens implants.  ALLERGIES:  None.  MEDICATIONS:  See the chart.  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  She does her own yard work.  She smoked briefly back in the 1960s.  She lives alone, is a widow, and as two children, a son and a daughter.  FAMILY HISTORY:  Negative for heart trouble.  Father died at age 68 of leukemia. Mother died at age 66 with kidney failure.  One brother is dead from cirrhosis. A sister is alive.  REVIEW OF SYSTEMS:  No TIAs or claudication.  No significant edema.  She still as her gallbladder.  She does take Prilosec for significant dyspepsia and has been  taking her Prilosec every day, takes this for reflux.  The remainder of the Review Of Systems is unremarkable.  PHYSICAL EXAMINATION:  GENERAL:  She is an elderly woman who is in no acute distress.  VITAL SIGNS:  Blood pressure 150/80, pulse 76.  SKIN:  Warm and dry.  HEENT:  Normal.  No thyromegaly or carotid bruits.  LUNGS:  Clear to auscultation and percussion.  CARDIAC:  Normal S1 and S2, no S3 or S4 or murmur.  ABDOMEN:  Soft, nontender.  No masses or organomegaly.  No aneurysm noted.  EXTREMITIES:  Femoral distal pulses 2+.  No bruits present.  No edema noted.  LABORATORY DATA:  The 12-lead ECG is normal.  Chest x-ray is normal.  Labs show a glucose of 121 but an otherwise normal chemistry panel.  CPK and Troponin were normal.  CBC shows a hemoglobin of 13.6, hematocrit 36.6, normal WBC, normal platelet count.  IMPRESSION: 1.  Indigestion type chest discomfort with features that are atypical for angina.     This could be an angina equivalent but could also be more likely to be GI in     origin.  It is not exertional and is associated with feelings of nonspecific     malaise. 2.  History of hiatal hernia with reflux.  RECOMMENDATIONS:  The patient has a normal EKG and she is being ruled out for an MI.  If she does rule out for MI I  would get a gallbladder ultrasound and consider an adenosine Cardiolite scan since her exercise duration was poor before.  This  would help [place] to screen and risk stratify her for ischemia.  I appreciate seeing this nice woman with you. DD:  05/10/99 TD:  05/11/99 Job: [id number]

## 2010-08-19 NOTE — Consult Note (Signed)
NAME:  [name], [name] NO.:  [id number]   MEDICAL RECORD NO.:  [id number]          PATIENT TYPE:  INP   LOCATION:  3731                         FACILITY:  [facility]   PHYSICIAN:  Shan Levans, M.D. LHCDATE OF BIRTH:  January 24, 1922   DATE OF CONSULTATION:  01/18/2004  DATE OF DISCHARGE:                                   CONSULTATION   CHIEF COMPLAINT:  Left pneumothorax.   HISTORY OF PRESENT ILLNESS:  This is a 77 year old white female who was  admitted on January 15, 2004 for atrial flutter with sick sinus syndrome and  a tachybrady situation.  The patient has now had a pacemaker placed via the  left subclavian approach this morning.  Postoperative, though, found to have  left pneumothorax after air was aspirated during the initial cannulation of  the left subclavian vein.   PAST MEDICAL HISTORY:  1.  History of asthma in the distant past, but none recently.  2.  History of atrial flutter with sick sinus syndrome.  3.  Paroxysmal atrial fibrillation and flutter.  4.  Hypertension.   ALLERGIES:  None.   MEDICATIONS ON ADMISSION:  1.  Amiodarone.  2.  Ditropan.  3.  Nexium.  4.  Diazide.  5.  Altace.  6.  Ocuvite.  7.  Folic acid.   SOCIAL HISTORY:  Otherwise noncontributory.   REVIEW OF SYSTEMS:  Otherwise noncontributory.   FAMILY HISTORY:  Otherwise noncontributory.   PHYSICAL EXAMINATION:  VITAL SIGNS:  Temperature 98, blood pressure 120/54,  pulse 24, respirations 20, saturation 94% on 2 L.  CHEST:  Diminished breath sounds on the left, hyperresonance to percussion  on the left, clear on the right.  CARDIAC:  Irregular irregular rhythm without S3.  Normal S1, S2.  ABDOMEN:  Soft, nontender.  EXTREMITIES:  No edema or clubbing.  SKIN:  Clear.   Chest x-ray reviewed shows a 30% left pneumothorax with pacemaker in place.  There is no evidence of mediastinal shift or subcutaneous air or tension at  the pneumothorax.  INR is 2.4.  Sodium 136,  potassium 4.1, chloride 102, CO2  25, BUN 24, creatinine 1.7, calcium 9.2, blood sugar 121.  White count 7.4,  hemoglobin 12.1, platelet count 217,000.   IMPRESSION:  Left pneumothorax status post pacemaker placement.   PLAN:  Correct INR to 1.5 or less utilizing fresh frozen plasma and vitamin  K, then place left pleural chest tube in the anterior position and follow up  chest x-ray.  Place chest tube to -20 suction.      Patr   PW/MEDQ  D:  01/18/2004  T:  01/19/2004  Job:  [id number]   cc:   Rollene Rotunda, M.D.

## 2010-08-19 NOTE — Op Note (Signed)
NAME:  [name], [name] NO.:  [id number]   MEDICAL RECORD NO.:  [id number]          PATIENT TYPE:  INP   LOCATION:  3731                         FACILITY:  [facility]   PHYSICIAN:  Rollene Rotunda, M.D.   DATE OF BIRTH:  02/13/1922   DATE OF PROCEDURE:  01/18/2004  DATE OF DISCHARGE:                                 OPERATIVE REPORT   PRIMARY:  Dr. Luanna Cole. [place].   PROCEDURE:  Permanent pacemaker implantation, dual-chamber.   PREOPERATIVE DIAGNOSIS:  Bradycardia, tachy-brady syndrome.   POSTOPERATIVE DIAGNOSIS:  Bradycardia, tachy-brady syndrome.   SURGEON:  Rollene Rotunda, M.D.   PROCTORDoylene Canning. Ladona Ridgel, M.D.   DESCRIPTION OF PROCEDURE:  After obtaining informed consent, the patient was  brought to the laboratory and placed on the fluoroscopy table in the supine  position.  After routine prep and drape, lidocaine was infiltrated along the  subclavian region.  Incision was carried down to the layer of the  prepectoral fascia.  The fascia was kept intact.  Hemostasis was achieved.  Using predominantly blunt dissection with some electrocautery, a pocket was  formed.  Access was obtained through the subclavian vein.  The initial  guidewire was inserted without difficulty.  A second stick was utilized for  the second guidewire and air was aspirated, indicating a possible  pneumothorax.  However, the patient remained stable without any complaints  and no dyspnea.  Her oxygen saturations remained stable, as did her vitals.  We utilized 2 active-fixation leads with a 5076 atrial 45-cm lead and a 5076  ventricular 52-cm lead.  Atrial lead serial number was [id number].  The  ventricular lead serial number was [id number].  Under fluoroscopic guidance,  they were manipulated into the right ventricular apex and right atrial  appendage.  Bipolar R waves were 21.2 mV.  During the course of this  insertion, the patient converted from atrial fibrillation.  Her fibrillation  waves prior to this were 4.7 mV.  The ventricular impedance was 975 ohms  with an atrial impedance of 970 ohms and was quite stable.  The threshold in  the ventricle was 0.8 V at 0.4 msec.  Current was set at 6 mA.  An EnRhythm  P1501DR generator was placed after the pocket was copiously irrigated with  antibiotic fluid.  The serial number for the device was [id number].  There  was no evidence of diaphragmatic stimulation at 10 mV.  The leads were  secured to the prepectoral fascia and then attached to the generator.  Hemostasis was assured.  Wound was washed and dried after closing with 4-0  and 2-0 absorbable sutures.  The dressing was taped with Benzoin and Steri-  Strips.  Needle, sponge and instrument counts were correct at the end of the  procedure.  The patient tolerated the procedure well with questionable  pneumothorax.  A postoperative chest x-ray will be obtained and treated  accordingly.  The patient left the lab in very stable condition.       JH/MEDQ  D:  01/21/2004  T:  01/21/2004  Job:  [id number]   cc:  Luanna Cole. Lenord Fellers, M.D.  [address], [place]  [place]  [place] [address]  Fax: [phone]

## 2010-08-19 NOTE — Op Note (Signed)
NAME:  [name], [name] NO.:  [id number]   MEDICAL RECORD NO.:  [id number]          PATIENT TYPE:  INP   LOCATION:  4729                         FACILITY:  [facility]   PHYSICIAN:  Doylene Canning. Ladona Ridgel, M.D.  DATE OF BIRTH:  1921-12-18   DATE OF PROCEDURE:  06/30/2004  DATE OF DISCHARGE:                                 OPERATIVE REPORT   PROCEDURE PERFORMED:  Electrophysiologic study and radiofrequency catheter  ablation of atrial flutter isthmus.   ATTENDING:  Doylene Canning. Ladona Ridgel, M.D.   I. INTRODUCTION:  The patient is a 77 year old woman with a history tachy-  brady syndrome and documented atrial flutter in the past.  She presented to  hospital feeling poorly and was found to be in atrial flutter with a rapid  ventricular response.  She spontaneously returned to sinus rhythm.  She is  now referred for electrophysiologic study and catheter ablation.   II. PROCEDURE:  After informed consent was obtained, the patient was taken  to the diagnostic EP lab in the fasting state.  After the usual preparation  and draping, a 6-French hexapolar catheter was inserted percutaneously into  the right jugular vein and advanced to the coronary sinus.  A 7-French 20  Halo catheter was inserted percutaneously into the right femoral vein and  advanced to the right atrium.  A 5-French quadripolar catheter was inserted  percutaneously into the right femoral vein and advanced the His bundle  region.  After measurement of the basic intervals, rapid atrial pacing was  carried out from the coronary sinus, demonstrating intact atrial flutter  isthmus conduction.  Because there had been a remote history of atrial  fibrillation, attempts to reinduce atrial flutter were not carried out.  During rapid atrial pacing from the coronary sinus, the ablation catheter  was maneuvered into the usual atrial flutter isthmus.  Eleven RF energy  applications were delivered to the atrial flutter isthmus resulting in  a  creation of bidirectional isthmus block.  Following this, the patient was  observed for 30 minutes.  During this time, rapid atrial pacing was carried  out the coronary sinuses as well as from the high right atrium.  This  demonstrated bidirectional isthmus block.  Additional rapid atrial pacing  was carried out and stepwise decreased down to 390 msec, where A-V  Wenckebach was observed.  During rapid atrial pacing, the P-R interval was  less than the R-R interval and there was no inducible SVT.  Next, programmed  stimulation was carried out from the high right atrium as well as the  coronary sinus at basic drive cycle length 161 msec.  The S1-S2 interval was  stepwise decreased from 440 msec down to 250 msec, where atrial  refractoriness was observed.  During programmed atrial stimulation, there no  A-H jumps and no inducible SVT.  Next, rapid ventricular pacing was carried  out the RV apex at pacing cycle length of 600 msec and stepwise decreased  down to 460 msec, where VA Wenckebach was observed.  During rapid  ventricular pacing, the  atrial activation was midline and decremental.  Next, programmed ventricular stimulation was carried out the RV apex at the  basic drive cycle length 191 msec.  The  S1-S2 interval was stepwise  decreased from 540 msec down to 440 msec with a retrograde AV node ERP was  observed.  During programmed ventricular stimulation atrial activation, the  atrial activation was again midline and decremental.  At this point, after  the patient had been observed for 30 minutes with no evidence of atrial  flutter isthmus conduction, the catheters were removed,  hemostasis was  assured and the patient was returned to her room in satisfactory condition.   III. COMPLICATIONS:  There were no immediate procedural complications.   IV. RESULTS:  A.  Baseline ECG:  The baseline ECG demonstrates normal sinus  rhythm with normal axis and intervals.  B.  Baseline intervals:   The H-V interval was 44 msec.  The QRS duration was  89  msec.  The sinus node cycle length was 730 msec.  C.  Rapid ventricular pacing :  Rapid ventricle pacing was carried out from  the RV apex, demonstrating V-A Wenckebach cycle length of 460 msec.  During  rapid ventricular pacing, the atrial activation was midline decremental.  D.  Programmed ventricular stimulation:  Programmed ventricular stimulation  was carried out from the RV apex at basic drive cycle length of 478 msec.  The S1-S2 interval was stepwise decreased from 540 msec down to 440 msec,  where the retrograde the A-V node ERP was observed.  During programmed  ventricular stimulation, the atrial activation was midline and decremental.  E.  Rapid atrial pacing:  Rapid atrial pacing was carried out following  catheter ablation at a cycle length of 600 msec and stepwise decreased down  to 390 msec, where A-V Wenckebach was observed.  During rapid atrial pacing,  the P-R interval was less than the R-R interval and there was no inducible  SVT.  F.  Programmed atrial stimulation:  Programmed atrial stimulation was  carried out from the coronary sinus as well as the high right atrium with  basic drive cycle length of 295 msec.  The S1-S2 interval was stepwise  decreased from 440 msec down to 250 msec, where atrial refractoriness was  observed.  During programmed atrial stimulation,, there were no A-H jumps  nor any echo beats, nor any inducible SVT.  G.  Arrhythmias observed:  There were very brief episodes of nonsustained  atrial fibrillation.  H.  Mapping:  Mapping of Koch's triangle demonstrated typical size and  orientation as well as the atrial flutter isthmus demonstrated typical size  and orientation.  1.  RF energy application:  A total of 11 RF energy applications were      delivered resulting in creation of bidirectional isthmus block in the      atrial flutter isthmus.  V. CONCLUSIONS:  This study demonstrated  successful electrophysiologic study  and radiofrequency catheter ablation of typical atrial flutter with  successful isthmus ablation, resulting in the creation of bidirectional  block.  There was no recurrent atrial flutter isthmus conduction for 30  minutes after catheter ablation.      GWT/MEDQ  D:  06/30/2004  T:  07/01/2004  Job:  [id number]   cc:   Rollene Rotunda, M.D.   Luanna Cole. Lenord Fellers, M.D.  [address] [place]  [place]  [place] [address]  Fax: [phone]

## 2010-08-19 NOTE — Assessment & Plan Note (Signed)
[facility]                            CARDIOLOGY OFFICE NOTE   [name], [name]                        MRN:          [id number]  DATE:03/12/2006                            DOB:          01/05/1922    PRIMARY:  Dr. Luanna Cole. Silva.   REASON FOR PRESENTATION:  A patient with atrial fibrillation, tachybrady  syndrome.   HISTORY OF PRESENT ILLNESS:  The patient returns for followup.  She has  done well since her I last saw her, except she has had some falls.  She  went [facility] and had quite an evaluation.  She says it was a  constellation of problems that has been treated with a cane.  She says  she is having less of this now and has not had any significant injuries  with her falls.  She is trying to be very careful.  She has had one knot  on her head several weeks ago but no bruising or significant trauma  since then.  She does not lose consciousness.  She simply loses her  balance.   She otherwise says she has had no problems with her heart.  She denies  any palpitations.  She has had no chest discomfort, neck discomfort, arm  discomfort, activity induced nausea and vomiting, excessive diaphoresis.  She has had no significant shortness of breath.  Denies any PND or  orthopnea.   PAST MEDICAL HISTORY:  1. Atrial flutter, status post ablation.  2. Paroxysmal atrial fibrillation.  3. Tachybrady syndrome status post pacemaker placement.  4. Chronic anticoagulation.  5. Hypertension.  6. Asthma.  7. Gastroesophageal reflux disease.  8. Esophageal stricture.  9. Diverticulosis.  10.Tremor.   ALLERGIES:  None.   CURRENT MEDICATIONS:  1. Nexium 40 mg daily.  2. Ocuvite.  3. Ditropan.  4. Altace 2.5 mg daily.  5. Triamterene/hydrochlorothiazide daily.  6. Coumadin per our Coumadin Clinic.  7. Foltx.  8. Alprazolam.  9. Amiodarone 200 mg daily.  10.Metoprolol 50 mg daily.   REVIEW OF SYSTEMS:  As stated in the HPI and otherwise  negative for  other systems.   PHYSICAL EXAMINATION:  The patient is in no distress.  Blood pressure 128/76, heart rate 64 and regular, weight 159 pounds,  body mass index 26.  HEENT:  Eyes unremarkable.  Pupils equal, round, and reactive to light.  Fundi not visualized.  Oral mucosa is unremarkable.  NECK:  No jugular venous distention at 45 degrees.  Carotid upstroke  brisk and symmetric.  No bruits, thyromegaly.  LYMPHATICS:  No cervical, axillary, or inguinal adenopathy.  LUNGS:  Clear to auscultation bilaterally.  BACK:  No costovertebral angle tenderness.  CHEST:  Unremarkable.  HEART:  PMI not displaced or sustained.  S1 and S2 within normal limits.  No S3, no S4, no murmurs.  ABDOMEN:  Flat, positive bowel sounds, normal in frequency and pitch, no  bruits, no rebound, no guarding, no midline pulsatile mass, no  hepatomegaly, no splenomegaly.  SKIN:  No rashes, no nodules.  EXTREMITIES:  2+ pulses throughout, no edema, no cyanosis, clubbing.  NEURO:  Oriented to person, place, and time.  Cranial nerves II through  XII grossly intact, motor grossly intact throughout.   EKG atrial paced rhythm, axis within normal limits, intervals within  normal limits, no acute ST wave changes.   ASSESSMENT AND PLAN:  1. Atrial fibrillation.  The patient has not had her device checked in      awhile and does not know how much mode switching she has had.  She      is currently in a regular rhythm.  Since she is doing so well and      has such a significant problem with her dysrhythmia, I am reluctant      to discontinue anything.  I think she still needs the Coumadin but      we discussed the risk of this at great length given her falls.  She      says this is getting better and she is much less prone to this with      her cane.  She is going to be extremely cautious.  She will let me      know if this becomes worse rather than better.  For now, she will      continue the Coumadin.  I will  also continue the amiodarone.  I      will check pulmonary function testing.  I will check a TSH, liver,      and BMET today.  2. Status post pacemaker placement.  I will have her scheduled to come      back and be seen in the Device Clinic and we will schedule Dr.      Ladona Ridgel.  He could see her every 6 months and I could see her 6      months later so that we stagger our appointments and she is seen      here      twice a year.  She will also, of course, be followed in our      Coumadin Clinic.  3. Followup as above.     Rollene Rotunda, MD, [facility]  Electronically Signed    JH/MedQ  DD: 03/12/2006  DT: 03/12/2006  Job #: [id number]   cc:   Michaela Cole. Lenord Fellers, M.D.

## 2010-08-19 NOTE — H&P (Signed)
[place]  Patient:    [name], [name]                        MRN: [id number] Adm. Date:  [id number] Attending:  Sharlet Salina CC:         Darden Palmer., M.D.             Genene Churn. Sherin Quarry, M.D.                         History and Physical  CHIEF COMPLAINT:  "Dont feel right."  HISTORY OF PRESENT ILLNESS:  This 77 year old white female, well-known to me, presented with complaint of shortness of breath, more indigestion than usual for two weeks and some vague chest discomfort.  She has felt very weak and tired after working in the yard last week.  She has been on Prilosec for some time for gastroesophageal reflux disease.  She had a Schatzkis ring dilated in 1992 and  stricture dilated in 1996 by Dr. Genene Churn. Sherin Quarry.  She had a 2-D echocardiogram in 1996 at [facility], read by Dr. Vesta Mixer, Jr., which was normal, and an exercise tolerance test by Dr. Peter M. [place] in 1996 which was remarkable for J point depression in the inferior and lateral lead.  One-millimeter changes persisted into recovery but were without symptoms.  She is admitted for further  evaluation and to rule out MI.  ALLERGIES:  No known drug allergies.  PAST MEDICAL HISTORY:  Has not been on aspirin regularly.  Has a history of essential tremor, treated with Inderal 20 mg b.i.d., history of positional vertigo, treated with p.r.n. Xanax, urinary incontinence, treated with Detrol 2 mg b.i.d., and a cataract extraction in the right eye in 1992 and a cataract extraction in the left eye in 1993.  Had right middle lobe pneumonia in June 1996; did have Pneumovax immunization in October 1997.  Strep throat in [place] 1998.  SOCIAL HISTORY:  She is a retired Furniture conservator/restorer.  She is a widow. She quit smoking some 37 years ago.  Occasional alcohol intake.  FAMILY HISTORY:  Father died at age 32 of leukemia.  Mother died at age 11  of renal failure and breast cancer.  One brother died of cirrhosis of the liver and one sister is alive.  PHYSICAL EXAMINATION:  VITAL SIGNS:  Temperature 98.6 degrees orally.  Blood pressure 174/90.  Pulse 70, regular.  SKIN:  Warm and dry.  NODES:  None.  HEENT:  Head is normocephalic, atraumatic.  Sclerae and conjunctivae are clear.  TMs are clear.  Pharynx is clear.  Fundi are benign.  Disks are sharp and flat.   NECK:  Supple without thyromegaly or carotid bruits.  CHEST:  Clear to auscultation.  CARDIAC:  Regular rate and rhythm.  Normal S1 and S2.  Without murmurs or gallops.  ABDOMEN:  Bowel sounds are active.  No hepatosplenomegaly, masses or tenderness.  EXTREMITIES:  Without edema.  NEUROLOGIC:  No focal deficits.  Stool is guaiac-negative.  LABORATORY DATA:  EKG shows LVH without acute changes.  IMPRESSION:  Vague chest discomfort with dyspepsia (somewhat atypical for angina) -- it could possibly be angina -- and since the patient is not a complainer, I eel it will be best to admit her to the hospital and have her undergo further testing.  PLAN:  Patient will be admitted to a telemetry  floor.  CK-MB fractions will be obtained, along with a troponin I assay.  Cardiology consultation will be obtained and Dr. Sherin Quarry will be contacted. DD:  05/11/99 TD:  05/12/99 Job: [id number]

## 2010-08-19 NOTE — Assessment & Plan Note (Signed)
[facility]                         ELECTROPHYSIOLOGY OFFICE NOTE   [name], [name]                        MRN:          [id number]  DATE:05/14/2006                            DOB:          1921-09-30    [name] was seen today in the clinic, on May 14, 2006, for followup  of her Medtronic model No. [id number] EnRhythm.  Date of implant was  January 18, 2004, for tachy-brady syndrome.  On interrogation of her  device today, battery voltage was 3.02.  P-waves measured 3.4 millivolts  with an atrial capture threshold of 0.5 volts at 0.4 milliseconds and  atrial lead impedence of 720 ohms.  R-waves measured 6.5 millivolts with  a ventricular pacing threshold of 1.5 volts at 0.4 milliseconds and a  ventricular lead impedence of 376 ohms.  Med switch episodes totaled  0.6% of the time and the patient is on Coumadin therapy.  No changes  were made in her parameters.  She was signed up today for [facility] and  will send transmission at 3, 6 and 9 months' time, with her return  office visit in one year.      Altha Harm, LPN  Electronically Signed      Doylene Canning. Ladona Ridgel, MD  Electronically Signed   PO/MedQ  DD: 05/14/2006  DT: 05/14/2006  Job #: [id number]

## 2010-08-19 NOTE — H&P (Signed)
NAME:  [name], [name] NO.:  [id number]   MEDICAL RECORD NO.:  [id number]          PATIENT TYPE:  INP   LOCATION:  1825                         FACILITY:  [facility]   PHYSICIAN:  Rollene Rotunda, M.D.   DATE OF BIRTH:  [birth year]   DATE OF ADMISSION:  06/29/2004  DATE OF DISCHARGE:                                HISTORY & PHYSICAL   PRIMARY CARDIOLOGIST:  Rollene Rotunda, M.D.   Pearletha Forge:  Doylene Canning. Ladona Ridgel, M.D.   PRIMARY CARE DOCTOR:  Luanna Cole. Baxley, M.D.   CHIEF COMPLAINT:  Nausea, weakness, shortness of breath, dizziness spells.   HISTORY OF PRESENT ILLNESS:  The patient states initially started around  3:30 this morning.  She woke up to go to the bathroom.  She states she just  felt bad all over.  She went back to bed, got up this morning around 7 a.m.  had a sudden onset of dizziness and shortness of breath.  Denies any chest  pain.  She states she just felt generally weak, so she went back and lay  down.  A little later her family called.  The daughter states the patient  was crying on the phone, states she was scared.  She brought the patient to  the Emergency Department here at [facility].  The patient complains  of fleeting dizziness, nausea, and shortness of breath.  Currently  asymptomatic, laying down quietly.  On arrival to the ED, ED nurse states  that the patient's heart rate was up to 140 at one time, however, was unable  to obtain an EKG at this time.  The patient states she was scheduled for an  outpatient echocardiogram today at our office.  She last saw Dr. Antoine Poche,  on June 23, 2004, for similar complaints.  She was to have an  echocardiogram done today.   ALLERGIES:  None.   MEDICATIONS:  1.  Nexium 40 daily.  2.  Ocuvite one tablet daily.  3.  Ditropan 10 mg daily.  4.  Altace 2.5 mg daily.  5.  Coumadin 2.5 mg all days except for 5 mg on Tuesday and Thursday.  6.  Foltx one p.o. daily.  7.  Synthroid 50 mcg  daily.  8.  The patient states she takes digoxin at home, dose not know the dose,      however, there is no documentation of digoxin in office notes.  9.  Metoprolol 50 mg p.o. t.i.d. per patient.  She states this dose was just      increased, however, there is no documentation of this increase from the      last office note.  10. Xanax 0.25 mg p.o. b.i.d.  11. Triamterine/HCTZ, 37.5/25 mg p.o. every day.   PAST MEDICAL HISTORY:  1.  Never had cardiac catheterization.  2.  Atrial fib with tachy-brady syndrome.  3.  Status post permanent pacemaker.  4.  Pneumothorax secondary to pacemaker insertion resulting in emergent      chest tube insertion.  5.  Benign positional vertigo.  6.  Hypertension.  7.  Asthma.  8.  GERD.  9.  Esophageal strictures.  10. Macular degeneration.  11. Diverticulosis.  12. Fine tremors.  13. Chronic anticoagulation therapy with Coumadin.   SOCIAL HISTORY:  She lives here in [place].  She lives alone.  She is a  widow.  She is also a retired Furniture conservator/restorer.  She has two adult  daughters alive and well.  Tobacco:  She states she smoked for about ten  years.  She quit 50 years ago.  Denies any ETOH, tobacco, or herbal  medication use.   FAMILY HISTORY:  Mother deceased at age 31 from breast cancer.  Father  deceased at age 4 from leukemia.  She has a brother who is deceased from  cirrhosis.   REVIEW OF SYSTEMS:  Positive for vertigo at times, shortness of breath,  dyspnea on exertion, palpitations, general weakness, arthritic pain in  knees.  GI:  Nausea related to episode above.  GERD symptoms without Nexium.  Cold intolerance at times.   PHYSICAL EXAMINATION:  VITAL SIGNS:  Temp 97.2, pulse 64 to 87 in and out of  sinus rhythm atrial flutter, respirations 16, blood pressure 136/68, sat 98%  on room air.  GENERAL:  She is alert and oriented.  No acute distress.  She is a very  pleasant and cooperative female.  HEENT:  Pupils are  equal, round, and reactive to light.  Sclerae are clear.  The patient has her own teeth.  NECK:  Supple without lymphadenopathy.  Negative bruits.  Negative JVD.  CARDIOVASCULAR:  Irregular.  LUNGS:  Clear to auscultation bilateral.  ABDOMEN:  Soft nontender.  Positive bowel sounds.  EXTREMITIES:  No clubbing, cyanosis, or edema.  No spine or CVA tenderness.  NEUROLOGIC:  Alert and oriented x 3.  Cranial nerves II-XII grossly intact.   Chest x-ray:  Pending.  EKG showing atrial flutter at a rate of 102 with a  QRS of 92, a QTC of 538.  Lab work the only results available so far are CBC  with a WBC of 8.6, hemoglobin 12, hematocrit 33.7, platelets 245,000.   Dr. Maurine Cane in to see patient.  Patient examined and assessed.   An 77 year old Caucasian female paroxysmal atrial fibrillation with  increased shortness of breath, dizziness, currently in and out of a sinus  rhythm, atrial flutter as we speak.  Heart rate had increased to 140 at one  time.  The patient denies any shortness of breath or dizziness now and is in  a normal sinus rhythm.   PLAN:  1.  Continue digoxin.  2.  Continue metoprolol 50 mg p.o. t.i.d.  3.  We will check an echocardiogram while the patient is here.  4.  Schedule her for adenosine Cardiolite in the a.m.  5.  Also we will have EP to see the patient, questionable need for      antiarrhythmic therapy while here.  6.  We will also cycle her cardiac enzymes.  7.  Check a TSH and a BNP and a D-dimmer.  8.  Continue her medications from home.      MB/MEDQ  D:  06/29/2004  T:  06/29/2004  Job:  [id number]

## 2010-08-19 NOTE — Consult Note (Signed)
NAME:  [name], [name] NO.:  [id number]   MEDICAL RECORD NO.:  [id number]          PATIENT TYPE:  INP   LOCATION:  2906                         FACILITY:  [facility]   PHYSICIAN:  Doylene Canning. Ladona Ridgel, M.D.  DATE OF BIRTH:  Jul 07, 1921   DATE OF CONSULTATION:  12/21/2003  DATE OF DISCHARGE:                                   CONSULTATION   REQUESTING PHYSICIAN:  Sharlet Salina, M.D.   INDICATION FOR CONSULTATION:  Evaluation of bradycardia and near syncope.   HISTORY OF PRESENT ILLNESS:  The patient is a 77 year old woman with a  history of paroxysmal atrial fibrillation who has been on beta blocker  therapy for rate control and Coumadin therapy.  The patient, over the last  six months, has had intermittent episodes of dizziness and lightheadedness  but over the last several days these have increased in their frequency and  severity.  She has had no frank syncope, but has had spells where she felt  like everything was becoming dark and feeling woozy in her head.  She also  states that she has had a marked reduction in her energy level and overall  malaise has been present.  She denies nausea, vomiting, diarrhea, or  constipation.  She denies chest pain or shortness of breath.  She does note  overall generalized fatigue.  She was seen in Dr. Beryle Quant office and  admitted to the hospital after EKG demonstrated sinus rhythm with PACs in a  bigeminal manner.   PAST MEDICAL HISTORY:  1.  Hypertension.  2.  History of paroxysmal atrial fibrillation.   SOCIAL HISTORY:  The patient is widowed and lives in [place].  She quit  smoking cigarettes in the 1960s.  Drinks two caffeinated beverages per day  and has two children.  She denies alcohol abuse.   FAMILY HISTORY:  Notable for mother dying age 87 of a stroke, father dying  of leukemia in his 33s.  One brother died of cirrhosis and she has one  sister with arthritis.   REVIEW OF SYSTEMS:  Negative for fevers, chills,  and night sweats.  She does  complain of vertigo type symptoms over the last several days.  These are  described as sensation of dizziness and lightheaded, but not of the room  spinning.  She denies chest pain, shortness of breath, peripheral edema,  orthopnea, or PND.  She does have occasional episodes of presyncope as  previously noted.  She has no claudication, cough, or hemoptysis.  She has  history of nocturia x2, but denies dysuria, hematuria, or urinary frequency.  She denies weakness, numbness, anxiety, depression.  She denies nausea,  vomiting, diarrhea, constipation.  She does have reflux type symptoms.  She  gives history of bilateral arthralgias.  She does note some difficulty with  her bowel habit.   PHYSICAL EXAMINATION:  GENERAL:  She is a pleasant, elderly-appearing woman  who is in no distress.  VITAL SIGNS:  Blood pressure 128/70, pulse variable from 40 up to 62 beats  per minute, respirations 16.  HEENT:  Normocephalic, atraumatic.  Pupils  are equal and round.  The  oropharynx was moist.  Sclerae were anicteric.  NECK:  No jugular venous distention.  There was no thyromegaly.  The trachea  was midline.  LUNGS:  Clear bilaterally to auscultation.  There were no wheezes, rales, or  rhonchi.  CARDIOVASCULAR:  Irregular rate and rhythm with normal S1 and S2.  ABDOMEN:  Soft, nontender, nondistended.  EXTREMITIES:  No clubbing, cyanosis, edema.  The pulses were 2+ and  symmetric.  SKIN:  Warm and dry.  NEUROLOGIC:  Alert and oriented x3 with cranial nerves II-XII grossly  intact.  Strength 5/5 and symmetric.   EKG demonstrates sinus rhythm with normal axis and intervals.  There were  frequent PACs.   IMPRESSION:  1.  Paroxysmal atrial fibrillation.  2.  Atrial bigemini.  3.  Dizziness probably caused by #2.  4.  History of diverticulosis.  5.  Chronic Coumadin therapy.   DISCUSSION:  Overall, [name] is stable.  I will talk to her about the  advisability of  pacemaker insertion.  At present, I do not think she has  enough bradycardia to warrant pacemaker insertion.  However, this Smolinsky  ultimately result.       GWT/MEDQ  D:  12/21/2003  T:  12/22/2003  Job:  [id number]

## 2010-08-19 NOTE — Discharge Summary (Signed)
NAME:  [name], DROZ NO.:  [id number]   MEDICAL RECORD NO.:  [id number]          PATIENT TYPE:  INP   LOCATION:  3731                         FACILITY:  [facility]   PHYSICIAN:  Doylene Canning. Ladona Ridgel, M.D.  DATE OF BIRTH:  1921/09/19   DATE OF ADMISSION:  01/15/2004  DATE OF DISCHARGE:  01/22/2004                                 DISCHARGE SUMMARY   ADDENDUM:   LABORATORY DATA:  Laboratory studies here at [facility] during the  admission from October 14, to October 21:  The first study of note was TSH  which taken on October 14.  Thyroid-stimulating hormone was 9.290.  This  warrants further exploration as an outpatient by her primary care givers.  Her admission BNP 160.8. Cardiac enzymes were negative x 3.  Discharge  complete blood count on October 21 was white cells 7.6, hemoglobin 11.1,  hematocrit 32.4, and platelets 192.  Once again, her discharge pro time was  19.6, INR 2.0.  BMP on October 19:  Sodium 136, potassium 4.1, chloride 103,  carbonate 27, glucose 123, BUN 20, creatinine 1.6.  Double check that  against the basic panel on October 17, creatinine 1.7.  This represents at  least some trend downward.  So we have abnormal TSH which warrants further  study and a therapeutic INR.       GM/MEDQ  D:  01/22/2004  T:  01/22/2004  Job:  [id number]   cc:   Luanna Cole. Lenord Fellers, M.D.  [address], [place]  [place]  [place] [address]  Fax: [phone]   Rollene Rotunda, M.D.

## 2010-08-19 NOTE — H&P (Signed)
NAME:  [name], [name] NO.:  [id number]   MEDICAL RECORD NO.:  [id number]          PATIENT TYPE:  INP   LOCATION:                               FACILITY:  [facility]   PHYSICIAN:  Rollene Rotunda, M.D.   DATE OF BIRTH:  04-15-1921   DATE OF ADMISSION:  01/15/2004  DATE OF DISCHARGE:                                HISTORY & PHYSICAL   BRIEF HISTORY:  This is a very pleasant 77 year old female followed by Dr.  Ladona Ridgel in our group.  She has a history of atrial bigeminy and paroxysmal  atrial fibrillation with sick sinus syndrome.  She was previously evaluated  for a pacemaker, and it was not felt indicated at that time.  She has been  on amiodarone for her arrhythmia.  She was placed on a Holter monitor to  further evaluate her rhythm.  She presents to the office today with  presyncope.  On her monitor, she was found to have multiple pauses of 3 to  3.6 seconds.  She was seen in conjunction with Dr. Antoine Poche, and decision  has been made to admit the patient for permanent pacemaker implant.   The patient denies any chest pain.  She has had some mild shortness of  breath.  As noted, she is on Coumadin.  She had an INR checked today that  was elevated at 5.7.  her Coumadin will be held.   PAST MEDICAL HISTORY:  1.  History of hypertension.  2.  History of paroxysmal atrial fibrillation with atrial bigeminy.  3.  She is on Coumadin therapy secondary to atrial fibrillation.  4.  Gastroesophageal reflux disease.  5.  History of esophageal stricture.  6.  History of diverticulosis.  7.  History of tremor treated with propranolol by her primary care      physician, Dr. Luanna Cole. [place].   ALLERGIES:  No known drug allergies.   CURRENT MEDICATIONS:  1.  Xanax 0.25 mg at bedtime.  2.  Ditropan XL 10 mg daily.  3.  Propranolol 20 mg b.i.d.  4.  Nexium 40 mg daily.  5.  Triamterene/hydrochlorothiazide 37.5/25 one daily.  6.  Altace 2.5 mg daily.  7.  Ocu-Vite 1 daily.  8.   Coumadin as directed.  9.  Amiodarone 200 mg daily.  10. Foltx 1 daily.   SOCIAL HISTORY:  The patient lives in [place].  She lives alone.  She is  widowed.  She has two children.  She quit smoking 40 years ago.  She uses  alcohol rarely.  She is a retired Runner, broadcasting/film/video.   FAMILY HISTORY:  The patient's father died at age 59 from acute lymphocytic  leukemia.  Her mother died at age 16.  She had CVAs and kidney failure. She  has a sister who is alive and well in her 10s.   REVIEW OF SYSTEMS:  Negative except for the following.  She has cold  intolerance.  She has had presyncope recently.  She has had a cough,  questionably secondary to allergies.  She does have a history of a tremor  treated with propranolol.  She has arthritis.  She has mild shortness of  breath, dyspnea on exertion, and orthopnea.   PHYSICAL EXAMINATION:  GENERAL:  Pleasant 77 year old white female in no  acute distress.  VITAL SIGNS:  Blood pressure 130/60, pulse 47.  Weight 166 pounds.  HEENT:  Unremarkable.  NECK:  No bruits, no jugular venous distention.  HEART:  Irregularly irregular rhythm with ectopic's.  LUNGS:  Clear.  ABDOMEN:  Soft, nontender.  EXTREMITIES:  Reveal pulses to be intact without significant edema.  SKIN:  Warm and dry.   LABORATORY AND X-RAY DATA:  EKG in the office today shows sinus rhythm with  a brady rate of 47 beats per minute with occasional pauses.   Her Holter monitor shows significant pauses up to 3.6 seconds.   IMPRESSION:  1.  Paroxysmal atrial fibrillation with atrial bigeminy and sick sinus      syndrome.  2.  Coumadin therapy, supratherapeutic INR today at 5.7.  3.  Hypertension.  4.  Gastroesophageal reflux disease.  5.  History of esophageal strictures.  6.  History of diverticulitis.  7.  History of tremor treated with propranolol.   PLAN:  As noted, the patient was seen in conjunction with Dr. Antoine Poche.  1.  We plan to hold the patient's Coumadin.  2.  She will be  admitted to [facility]  3.  We will continue her amiodarone for now but hold propranolol.  4.  We will schedule her for permanent pacemaker on Monday. We will ask EP      to evaluate her prior to this.  5.  We will start heparin when her INR is less than 2.       ________________________________________  Delton See, P.A. [facility]  ___________________________________________  Rollene Rotunda, M.D.   DR/MEDQ  D:  01/15/2004  T:  01/15/2004  Job:  [id number]   cc:   Luanna Cole. Lenord Fellers, M.D.  [address], [place]  [place]  [place] [address]  Fax: [phone]

## 2010-08-19 NOTE — Discharge Summary (Signed)
NAME:  [name], [name] NO.:  [id number]   MEDICAL RECORD NO.:  [id number]          PATIENT TYPE:  INP   LOCATION:  2906                         FACILITY:  [facility]   PHYSICIAN:  Luanna Cole. Lenord Fellers, M.D.   DATE OF BIRTH:  March 09, 1922   DATE OF ADMISSION:  12/21/2003  DATE OF DISCHARGE:  12/23/2003                                 DISCHARGE SUMMARY   FINAL DIAGNOSES:  1.  Atrial bigeminy.  2.  History of paroxysmal atrial fibrillation.  3.  Hypertension.  4.  Homocystinemia.  5.  Benign positional vertigo.  6.  Gastrointestinal reflux.  7.  Essential tremor.  8.  Stress urinary incontinence.   CONDITION ON DISCHARGE:  Stable and improved.   FOLLOWUP:  The patient is to have an event monitor provided by [facility]  Cardiology to wear for 30 days and will be seen in followup by the [facility]  Cardiology office soon thereafter.  The patient is to call my office and  schedule a physical examination for October of 2005.  She has been  discharged in the care of her daughter and will stay with her daughter for a  few days.   DISCHARGE MEDICATIONS:  1.  Coumadin 5 mg on Monday, Wednesday and Friday and 2.5 mg on Tuesday,      Thursday, Saturday and Sunday.  2.  Foltx one p.o. daily for homocystinemia.  3.  Nexium 40 mg daily.  4.  Inderal (propranolol) 20 mg twice a day.  5.  Altace 2.5 mg daily.  6.  Ocuvite multivitamins daily.  7.  Maxzide 25 mg one p.o. daily.  8.  Ditropan XL 10 mg one p.o. daily.  9.  Xanax 0.25 mg at bedtime and up to t.i.d. total if needed for recurrent      benign positional vertigo.   ACTIVITY:  The patient is to undergo no heavy activities, no bowling and no  driving for extended periods alone.   DIET:  She is to maintain a 4 g sodium, fat-modified diet.   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  She has a history of benign positional vertigo.  She has seen a neurologist  and had an MRI in the past.  She has been diagnosed with benign vertigo and  vertebrobasilar insufficiency.  She was seen in the office complaining of  dizziness and weakness.  When I placed her in a reclining position, her  pulse went from 65 to the 40s.  EKG showed significant bradycardia with  pulses.  It was felt that she probably had sick sinus syndrome and was  admitted to the hospital transitional coronary care unit.   HOSPITAL COURSE:  The patient was seen in consultation by [facility]  physicians.  Doylene Canning. Ladona Ridgel, M.D., electrophysiologist, provided  consultation and diagnosed the patient has having atrial bigeminy.  It was  felt the best course of action would  be to monitor the patient with an event  monitor for some 30 days and to determine if indeed a pacemaker was  warranted.  The patient improved in the hospital with close observation.  MI  was ruled out.  Inderal was continued and Xanax was continued for vertigo.  She continued with Coumadin therapy and pro time was found to be  therapeutic.  She was discharged home in stable condition on December 23, 2003, with the above discharge instructions.  It Billy be that she will  require pacemaker, but this needs to be investigated further with an event  monitor.       MJB/MEDQ  D:  02/04/2004  T:  02/04/2004  Job:  [id number]   cc:   [facility]

## 2010-08-19 NOTE — Discharge Summary (Signed)
NAME:  Michaela, Silva NO.:  [id number]   MEDICAL RECORD NO.:  [id number]          PATIENT TYPE:  INP   LOCATION:  4729                         FACILITY:  [facility]   PHYSICIAN:  Maple Mirza, P.A. DATE OF BIRTH:  1921/09/01   DATE OF ADMISSION:  06/29/2004  DATE OF DISCHARGE:  07/01/2004                                 DISCHARGE SUMMARY   DISCHARGE DIAGNOSES:  1.  Admitted with malaise, anxiety, dizziness, dyspnea, fatigue and      palpitations.  2.  Finding of typical atrial flutter on admission to emergency room of      [facility] on June 29, 2004, discharged July 01, 2004.  3.  Status post radiofrequency catheter ablation, 06/30/04, with successful      ablation of typical atrial flutter with bidirectional isthmus block, Dr.      Lewayne Bunting.   SECONDARY DIAGNOSES:  1.  Sick sinus syndrome/Tachybrady syndrome/3 to 3.5 second pauses, status      post implantation of Medtronic EnRhythm pacemaker October 2005.  2.  History of atrial bigeminy, paroxysmal atrial fibrillation/chronic      Coumadin.  3.  Degenerative joint disease in the knees.  4.  Hypertension.  5.  Stress incontinence.  6.  Gastroesophageal reflux disease.  7.  Esophageal stricture, status post dilatation x2.  8.  Diverticulosis.  9.  Macular degeneration.  10. Benign essential tremors for which she takes Xanax.   PROCEDURE:  On June 30, 2004, radiofrequency catheter ablation of typical  atrial flutter with bidirectional isthmus block.  No inducible  supraventricular tachycardia.  [name] is ready for discharge  postprocedure  day #1.  In the postprocedure, she has had breakthrough of paroxysmal atrial  fibrillation with heart rates with exertion up to 155.  She responds readily  to beta blockers and converts to sinus rhythm.  She has been obtaining  metoprolol 50 mg t.i.d. but for breakthrough she usually converts well on  metoprolol 2.5 mg IV.  She was started by Dr. Ladona Ridgel  on amiodarone 200 mg  daily, June 30, 2004, and will continue this.  The patient is on Coumadin  and will be appropriate adjustment of her Coumadin dose reflecting  initiation of amiodarone.  The patient will present to the Coumadin clinic  the week of July 04, 2004.  At the time of discharge, the patient is fairly  well controlled.  She has been receiving her morning medicines and she did  receive one dose of 2.5 mg IV metoprolol for heart rate 130 to 140s and this  was atrial fibrillation.  The patient is now in sinus rhythm in the 70s and  80s.  The patient was discharged on the following medications.   DISCHARGE MEDICATIONS:  1.  Nexium 40 mg daily.  2.  Ditropan 10 mg daily.  3.  Altace 2.5 mg daily.  4.  Foltx daily.  5.  Digoxin 0.125 mg daily.  6.  Ocuvite drops daily.  7.  Maxzide 37.5/25 daily.  8.  Levothyroxine 50 mcg daily.  9.  Metoprolol 50 mg three  times daily.  10. Amiodarone 200 mg daily.  11. Coumadin 2.5 mg daily, this is a new dose.  The patient had been on a      dose of 2.5 mg Tuesday and Thursday and 2.5 mg Monday, Wednesday,      Friday, Saturday and Sunday.  The new dose will be 2.5 mg daily.  12. Enteric-coated aspirin 81 mg daily, #12.  13. She is to take an extra one half of 50 mg of metoprolol if she feels her      heart racing or becomes shortness of breath.   DISCHARGE ACTIVITIES:  She is to avoid driving for the next two days to  avoid heavy straining or lifting for the next two weeks.   DISCHARGE DIET:  Low-sodium and low-cholesterol diet.   There is a note that if she plans dental work even teeth cleaning in the  next six months she is to call [phone] for antibiotic coverage.  Her  aspirin daily is only for the next six weeks.  She has close follow up of  her Coumadin.  She will be presented to the Coumadin Clinic on Tuesday,  July 05, 2004, at 11 a.m.  She will see Dr. Ladona Ridgel on Tuesday, July 26, 2004, at 10:45 a.m.  It will be hoped at  that time that she sees Dr. Ladona Ridgel  that some of her medications Youtsey be weaned.   BRIEF HISTORY:  [name] is an 77 year old female.  She has a history of Sick  sinus syndrome with pauses.  She had a Medtronic pacer implanted October  2005.  She has a history of atrial fibrillation and is on chronic Coumadin.  She has been having intermittent dizzy spells since her pacer implantation.  The pacemaker was adjusted in the office about two weeks ago.  This past  week, she has been feeling that her heart was racing.  She has been dizzy  with this.  The spells come and go.  She gets shortness of breath with them.  When they resolve, she is dragged out and fatigue.   The date before admission on June 28, 2004, she felt better but over night  she slept poorly.  She awoke restless at 3 o'clock in the morning on March,  29, 2006, went to the bathroom.  She felt dizzy and weak.  She had a hard  time making it back to bed.  She felt just as poorly in the morning on  awakening March, 29, 2006.  She called her daughter and was crying when she  was speaking to her.  She is not able to make sense of what was happening to  her.  She and her daughter came to the emergency room and electrocardiogram  there showed atrial flutter with a typical pattern with flutter waves  upright in V1 and flutter waves inverted in the inferior leads.  She was  seen by Dr. Ladona Ridgel who recommended atrial flutter ablation.   HOSPITAL COURSE:  The patient presented with symptomatic atrial flutter with  rapid rates as determined by electrocardiogram in the emergency room.  The  patient does have a history of paroxysmal atrial fibrillation.  The patient  had been feeling weak and dizzy sporadically through the week prior to our  admission.  She gets shortness of breath and feels fatigued when the  spells pass.  She was seen by Dr. Ladona Ridgel who recommended atrial flutter  ablation.  The patient agreed to this.  The  risks and benefits  have been described to her and she underwent a procedure on June 03, 2004, with  successful radiofrequency catheter ablation and creation of bidirectional  isthmus block.  The patient isthmus block.  The patient has had breakthrough  of her atrial fibrillation postprocedurally.  This is well controlled  usually with beta blocker which converts her to sinus rhythm but she has had  frequent breakthrough in the last 24 hours.  The patient was discharged on  her regular medications and has started  amiodarone 200 mg daily per Dr. Ladona Ridgel.  Coumadin will be adjusted to 2.5 mg  daily down from 5 mg Tuesday, Thursday, 2.5 mg Monday, Wednesday, Friday,  Saturday and Sunday.  She will follow up in the office at [facility]  on Tuesday, July 05, 2004, to check her PT/INR.      GM/MEDQ  D:  07/01/2004  T:  07/01/2004  Job:  [id number]   cc:   Doylene Canning. Ladona Ridgel, M.D.   Luanna Cole. Lenord Fellers, M.D.  [address] [place]  [place]  [place] [address]  Fax: [phone]   E. Graceann Congress, M.D.   Rollene Rotunda, M.D.

## 2010-09-05 ENCOUNTER — Encounter: Payer: Self-pay | Admitting: Internal Medicine

## 2010-09-05 ENCOUNTER — Ambulatory Visit (INDEPENDENT_AMBULATORY_CARE_PROVIDER_SITE_OTHER): Payer: Medicare Other | Admitting: Internal Medicine

## 2010-09-05 ENCOUNTER — Other Ambulatory Visit: Payer: Self-pay | Admitting: Internal Medicine

## 2010-09-05 ENCOUNTER — Ambulatory Visit
Admission: RE | Admit: 2010-09-05 | Discharge: 2010-09-05 | Disposition: A | Discharge status: Home / Self Care | Payer: Medicare Other | Source: Ambulatory Visit | Attending: Internal Medicine | Admitting: Internal Medicine

## 2010-09-05 VITALS — BP 176/72 | HR 78 | Temp 99.2°F | Ht 64.0 in | Wt 156.0 lb

## 2010-09-05 DIAGNOSIS — B9789 Other viral agents as the cause of diseases classified elsewhere: Secondary | ICD-10-CM

## 2010-09-05 DIAGNOSIS — J029 Acute pharyngitis, unspecified: Secondary | ICD-10-CM

## 2010-09-05 DIAGNOSIS — B349 Viral infection, unspecified: Secondary | ICD-10-CM

## 2010-09-05 MED ORDER — AMOXICILLIN-POT CLAVULANATE 500-125 MG PO TABS: 1.0000 | ORAL_TABLET | Freq: Three times a day (TID) | ORAL | Status: AC

## 2010-09-05 NOTE — Patient Instructions (Signed)
Take meds as directed call if not better in 48 hours, sooner if worse

## 2010-09-05 NOTE — Progress Notes (Signed)
  Subjective:    Patient ID: [name], female    DOB: 06/08/1921, 77 y.o.   MRN: [id number]  HPI two-day history of sore throat and headache. Says nose is stopped up. No documented fever. No shaking chills. Feels the chest is fairly clear. No shortness of breath.    Review of Systems no neck stiffness or tenderness, no visual disturbance, no vertigo.     Objective:   Physical Exam HEENT exam pharynx is radiated, rapid strep screen is negative. TMs are clear bilaterally. Neck is supple. No adenopathy. Chest is clear.        Assessment & Plan:   1-probable viral syndrome with pharyngitis and sinus congestion. Plan Augmentin 500 mg by mouth 3 times a day for 10 days. Vicodin 5/500 #30 one by mouth every 6 hours when necessary pain. Call if not better in 48 hours, sooner if worse

## 2010-09-06 ENCOUNTER — Encounter: Payer: Medicare Other | Admitting: *Deleted

## 2010-09-06 ENCOUNTER — Ambulatory Visit (INDEPENDENT_AMBULATORY_CARE_PROVIDER_SITE_OTHER): Payer: Medicare Other | Admitting: *Deleted

## 2010-09-06 DIAGNOSIS — I4892 Unspecified atrial flutter: Secondary | ICD-10-CM

## 2010-09-06 DIAGNOSIS — I4891 Unspecified atrial fibrillation: Secondary | ICD-10-CM

## 2010-09-06 DIAGNOSIS — Z7901 Long term (current) use of anticoagulants: Secondary | ICD-10-CM

## 2010-09-06 LAB — POCT INR: INR: 1.9

## 2010-09-07 ENCOUNTER — Telehealth: Payer: Self-pay

## 2010-09-07 ENCOUNTER — Emergency Department (HOSPITAL_COMMUNITY): Payer: Medicare Other

## 2010-09-07 ENCOUNTER — Inpatient Hospital Stay (INDEPENDENT_AMBULATORY_CARE_PROVIDER_SITE_OTHER)
Admission: RE | Admit: 2010-09-07 | Discharge: 2010-09-07 | Disposition: A | Discharge status: Home / Self Care | Payer: Medicare Other | Source: Ambulatory Visit | Attending: Family Medicine | Admitting: Family Medicine

## 2010-09-07 ENCOUNTER — Emergency Department (HOSPITAL_COMMUNITY)
Admission: EM | Admit: 2010-09-07 | Discharge: 2010-09-07 | Disposition: A | Discharge status: Home / Self Care | Payer: Medicare Other | Attending: Emergency Medicine | Admitting: Emergency Medicine

## 2010-09-07 DIAGNOSIS — J40 Bronchitis, not specified as acute or chronic: Secondary | ICD-10-CM | POA: Insufficient documentation

## 2010-09-07 DIAGNOSIS — I4891 Unspecified atrial fibrillation: Secondary | ICD-10-CM | POA: Insufficient documentation

## 2010-09-07 DIAGNOSIS — R059 Cough, unspecified: Secondary | ICD-10-CM | POA: Insufficient documentation

## 2010-09-07 DIAGNOSIS — R05 Cough: Secondary | ICD-10-CM | POA: Insufficient documentation

## 2010-09-07 DIAGNOSIS — R0609 Other forms of dyspnea: Secondary | ICD-10-CM

## 2010-09-07 DIAGNOSIS — Z7901 Long term (current) use of anticoagulants: Secondary | ICD-10-CM | POA: Insufficient documentation

## 2010-09-07 DIAGNOSIS — Z950 Presence of cardiac pacemaker: Secondary | ICD-10-CM | POA: Insufficient documentation

## 2010-09-07 DIAGNOSIS — R0602 Shortness of breath: Secondary | ICD-10-CM | POA: Insufficient documentation

## 2010-09-07 DIAGNOSIS — R0601 Orthopnea: Secondary | ICD-10-CM | POA: Insufficient documentation

## 2010-09-07 DIAGNOSIS — J069 Acute upper respiratory infection, unspecified: Secondary | ICD-10-CM | POA: Insufficient documentation

## 2010-09-07 DIAGNOSIS — I10 Essential (primary) hypertension: Secondary | ICD-10-CM | POA: Insufficient documentation

## 2010-09-07 DIAGNOSIS — N289 Disorder of kidney and ureter, unspecified: Secondary | ICD-10-CM | POA: Insufficient documentation

## 2010-09-07 DIAGNOSIS — R0989 Other specified symptoms and signs involving the circulatory and respiratory systems: Secondary | ICD-10-CM

## 2010-09-07 LAB — DIFFERENTIAL
Basophils Absolute: 0 10*3/uL (ref 0.0–0.1)
Eosinophils Absolute: 0.9 10*3/uL — ABNORMAL HIGH (ref 0.0–0.7)
Eosinophils Relative: 10 % — ABNORMAL HIGH (ref 0–5)
Lymphs Abs: 1.8 10*3/uL (ref 0.7–4.0)
Monocytes Absolute: 1.1 10*3/uL — ABNORMAL HIGH (ref 0.1–1.0)

## 2010-09-07 LAB — BASIC METABOLIC PANEL
CO2: 27 mEq/L (ref 19–32)
Calcium: 9.4 mg/dL (ref 8.4–10.5)
Chloride: 96 mEq/L (ref 96–112)
GFR calc Af Amer: 34 mL/min — ABNORMAL LOW (ref >60)
Sodium: 135 mEq/L (ref 135–145)

## 2010-09-07 LAB — CBC
MCHC: 35 g/dL (ref 30.0–36.0)
MCV: 92.7 fL (ref 78.0–100.0)
Platelets: 226 10*3/uL (ref 150–400)
RDW: 13.2 % (ref 11.5–15.5)
WBC: 9.3 10*3/uL (ref 4.0–10.5)

## 2010-09-07 NOTE — Telephone Encounter (Signed)
Phone call from patient today.She was seen here on Monday 09/05/10. Says she is sob and nauseated. Unable to sleep last night due to coughing so much. Daughter calls not long after her, stating she is sob and wheezy. Advised to take patient to ED or Urgent Care for evaluation. She is agreeable.

## 2010-09-12 ENCOUNTER — Ambulatory Visit
Admission: RE | Admit: 2010-09-12 | Discharge: 2010-09-12 | Disposition: A | Discharge status: Home / Self Care | Payer: Medicare Other | Source: Ambulatory Visit | Attending: Internal Medicine | Admitting: Internal Medicine

## 2010-09-12 ENCOUNTER — Ambulatory Visit (INDEPENDENT_AMBULATORY_CARE_PROVIDER_SITE_OTHER): Payer: Medicare Other | Admitting: Internal Medicine

## 2010-09-12 ENCOUNTER — Other Ambulatory Visit: Payer: Self-pay | Admitting: Internal Medicine

## 2010-09-12 ENCOUNTER — Encounter: Payer: Self-pay | Admitting: Internal Medicine

## 2010-09-12 VITALS — BP 166/78 | HR 68 | Temp 98.2°F | Wt 155.5 lb

## 2010-09-12 DIAGNOSIS — J189 Pneumonia, unspecified organism: Secondary | ICD-10-CM

## 2010-09-12 DIAGNOSIS — H8110 Benign paroxysmal vertigo, unspecified ear: Secondary | ICD-10-CM

## 2010-09-12 DIAGNOSIS — F419 Anxiety disorder, unspecified: Secondary | ICD-10-CM

## 2010-09-12 DIAGNOSIS — J329 Chronic sinusitis, unspecified: Secondary | ICD-10-CM

## 2010-09-12 DIAGNOSIS — R062 Wheezing: Secondary | ICD-10-CM

## 2010-09-12 DIAGNOSIS — R05 Cough: Secondary | ICD-10-CM

## 2010-09-12 DIAGNOSIS — J40 Bronchitis, not specified as acute or chronic: Secondary | ICD-10-CM

## 2010-09-12 DIAGNOSIS — F411 Generalized anxiety disorder: Secondary | ICD-10-CM

## 2010-09-12 LAB — CBC WITH DIFFERENTIAL/PLATELET
Eosinophils Absolute: 0.2 10*3/uL (ref 0.0–0.7)
Hemoglobin: 11.7 g/dL — ABNORMAL LOW (ref 12.0–15.0)
Lymphocytes Relative: 27 % (ref 12–46)
Lymphs Abs: 2.1 10*3/uL (ref 0.7–4.0)
MCH: 34.7 pg — ABNORMAL HIGH (ref 26.0–34.0)
MCV: 96.2 fL (ref 78.0–100.0)
Monocytes Relative: 5 % (ref 3–12)
Neutrophils Relative %: 65 % (ref 43–77)
RBC: 3.37 MIL/uL — ABNORMAL LOW (ref 3.87–5.11)
WBC: 7.7 10*3/uL (ref 4.0–10.5)

## 2010-09-12 MED ORDER — CEFTRIAXONE SODIUM 1 G IJ SOLR
1.0000 g | Freq: Once | INTRAMUSCULAR | Status: AC
  Administered 2010-09-12: 1 g via INTRAMUSCULAR

## 2010-09-13 ENCOUNTER — Encounter: Payer: Medicare Other | Admitting: *Deleted

## 2010-09-15 ENCOUNTER — Emergency Department (HOSPITAL_COMMUNITY)
Admission: EM | Admit: 2010-09-15 | Discharge: 2010-09-15 | Disposition: A | Discharge status: Home / Self Care | Payer: Medicare Other | Attending: Emergency Medicine | Admitting: Emergency Medicine

## 2010-09-15 ENCOUNTER — Emergency Department (HOSPITAL_COMMUNITY): Payer: Medicare Other

## 2010-09-15 DIAGNOSIS — Z950 Presence of cardiac pacemaker: Secondary | ICD-10-CM | POA: Insufficient documentation

## 2010-09-15 DIAGNOSIS — Z79899 Other long term (current) drug therapy: Secondary | ICD-10-CM | POA: Insufficient documentation

## 2010-09-15 DIAGNOSIS — E876 Hypokalemia: Secondary | ICD-10-CM | POA: Insufficient documentation

## 2010-09-15 DIAGNOSIS — R110 Nausea: Secondary | ICD-10-CM | POA: Insufficient documentation

## 2010-09-15 DIAGNOSIS — N189 Chronic kidney disease, unspecified: Secondary | ICD-10-CM | POA: Insufficient documentation

## 2010-09-15 DIAGNOSIS — R05 Cough: Secondary | ICD-10-CM | POA: Insufficient documentation

## 2010-09-15 DIAGNOSIS — R059 Cough, unspecified: Secondary | ICD-10-CM | POA: Insufficient documentation

## 2010-09-15 DIAGNOSIS — Z7901 Long term (current) use of anticoagulants: Secondary | ICD-10-CM | POA: Insufficient documentation

## 2010-09-15 DIAGNOSIS — R5381 Other malaise: Secondary | ICD-10-CM | POA: Insufficient documentation

## 2010-09-15 DIAGNOSIS — E871 Hypo-osmolality and hyponatremia: Secondary | ICD-10-CM | POA: Insufficient documentation

## 2010-09-15 DIAGNOSIS — I129 Hypertensive chronic kidney disease with stage 1 through stage 4 chronic kidney disease, or unspecified chronic kidney disease: Secondary | ICD-10-CM | POA: Insufficient documentation

## 2010-09-15 DIAGNOSIS — R42 Dizziness and giddiness: Secondary | ICD-10-CM | POA: Insufficient documentation

## 2010-09-15 LAB — CBC
Hemoglobin: 11.4 g/dL — ABNORMAL LOW (ref 12.0–15.0)
MCH: 32.4 pg (ref 26.0–34.0)
MCHC: 35.7 g/dL (ref 30.0–36.0)
MCV: 90.6 fL (ref 78.0–100.0)

## 2010-09-15 LAB — URINALYSIS, ROUTINE W REFLEX MICROSCOPIC
Bilirubin Urine: NEGATIVE
Glucose, UA: NEGATIVE mg/dL
Hgb urine dipstick: NEGATIVE
Protein, ur: NEGATIVE mg/dL
Urobilinogen, UA: 0.2 mg/dL (ref 0.0–1.0)

## 2010-09-15 LAB — COMPREHENSIVE METABOLIC PANEL
ALT: 9 U/L (ref 0–35)
AST: 12 U/L (ref 0–37)
Alkaline Phosphatase: 83 U/L (ref 39–117)
CO2: 27 mEq/L (ref 19–32)
Chloride: 94 mEq/L — ABNORMAL LOW (ref 96–112)
GFR calc Af Amer: 34 mL/min — ABNORMAL LOW (ref >60)
GFR calc non Af Amer: 28 mL/min — ABNORMAL LOW (ref >60)
Glucose, Bld: 91 mg/dL (ref 70–99)
Potassium: 3.4 mEq/L — ABNORMAL LOW (ref 3.5–5.1)
Sodium: 130 mEq/L — ABNORMAL LOW (ref 135–145)
Total Bilirubin: 0.4 mg/dL (ref 0.3–1.2)

## 2010-09-15 LAB — TROPONIN I: Troponin I: <0.3 ng/mL (ref <0.30)

## 2010-09-15 LAB — CK TOTAL AND CKMB (NOT AT ARMC): Total CK: 36 U/L (ref 7–177)

## 2010-09-16 LAB — URINE CULTURE
Colony Count: NO GROWTH
Culture: NO GROWTH

## 2010-09-24 ENCOUNTER — Other Ambulatory Visit: Payer: Self-pay | Admitting: Internal Medicine

## 2010-09-28 ENCOUNTER — Other Ambulatory Visit: Payer: Self-pay | Admitting: Internal Medicine

## 2010-09-28 ENCOUNTER — Ambulatory Visit (INDEPENDENT_AMBULATORY_CARE_PROVIDER_SITE_OTHER): Payer: Medicare Other | Admitting: *Deleted

## 2010-09-28 DIAGNOSIS — I4891 Unspecified atrial fibrillation: Secondary | ICD-10-CM

## 2010-09-28 DIAGNOSIS — I4892 Unspecified atrial flutter: Secondary | ICD-10-CM

## 2010-09-28 DIAGNOSIS — Z950 Presence of cardiac pacemaker: Secondary | ICD-10-CM

## 2010-09-30 ENCOUNTER — Encounter: Payer: Self-pay | Admitting: Internal Medicine

## 2010-09-30 ENCOUNTER — Ambulatory Visit (INDEPENDENT_AMBULATORY_CARE_PROVIDER_SITE_OTHER): Payer: Medicare Other | Admitting: Internal Medicine

## 2010-09-30 VITALS — BP 160/66 | Temp 98.4°F | Ht 64.0 in | Wt 162.0 lb

## 2010-09-30 DIAGNOSIS — M170 Bilateral primary osteoarthritis of knee: Secondary | ICD-10-CM

## 2010-09-30 DIAGNOSIS — H6980 Other specified disorders of Eustachian tube, unspecified ear: Secondary | ICD-10-CM

## 2010-09-30 DIAGNOSIS — H8110 Benign paroxysmal vertigo, unspecified ear: Secondary | ICD-10-CM

## 2010-09-30 DIAGNOSIS — H6690 Otitis media, unspecified, unspecified ear: Secondary | ICD-10-CM

## 2010-09-30 DIAGNOSIS — G250 Essential tremor: Secondary | ICD-10-CM

## 2010-09-30 DIAGNOSIS — H6120 Impacted cerumen, unspecified ear: Secondary | ICD-10-CM

## 2010-09-30 DIAGNOSIS — J309 Allergic rhinitis, unspecified: Secondary | ICD-10-CM

## 2010-09-30 DIAGNOSIS — M1710 Unilateral primary osteoarthritis, unspecified knee: Secondary | ICD-10-CM

## 2010-09-30 DIAGNOSIS — I10 Essential (primary) hypertension: Secondary | ICD-10-CM

## 2010-09-30 MED ORDER — METHYLPREDNISOLONE ACETATE 80 MG/ML IJ SUSP
80.0000 mg | Freq: Once | INTRAMUSCULAR | Status: AC
  Administered 2010-09-30: 80 mg via INTRAMUSCULAR

## 2010-09-30 NOTE — Progress Notes (Signed)
  Subjective:    Patient ID: [name], female    DOB: 1921/12/27, 77 y.o.   MRN: [id number]  HPI  patient called today for an acute appointment complaining once again of ear congestion. In the interim, has been to the emergency room diagnosed with dehydration and given IV fluids. Patient says that during this hot weather she is drinking plenty of fluids. She is leaving later on today to go [place] the mountains and then to [place]. Will be gone for 3 or 4 weeks. Complaining of some recurrent vertigo. Long-standing history of benign positional vertigo. Says that it only occurs when she's lying on one side and goes on for 7 seconds. Says right ear feels full as if something is in the external ear canal. Went to an otolaryngologist recently and had wax removed from ears. No recent URI symptoms in the past week. Has been complaining of sinus congestion and ear problems for several weeks now. History has been vague and difficult to sort out. Seen in emergency department June 6 with bronchitis and again June 14 with dehydration. History of essential tremor, benign positional vertigo, paroxysmal atrial fibrillation, renal insufficiency, hypothyroidism, hearing loss both the ears degenerative joint disease of the knees, pacemaker insertion 2005 for tachybradycardia syndrome, GE reflux, hypertension chronic anticoagulation. Recent chest x-ray in emergency department June 11 showed COPD with hyperinflation, recent sinus films done as an outpatient showed mild mucosal thickening in the maxillary sinuses. Seems to have an element of anxiety. Is on Xanax chronically for vertigo and anxiety.      Review of Systems     Objective:   Physical Exam right TM chronically scarred but not red; no significant cerumen in right external ear canal. External ear canal on the left has cerumen which was removed with curette and patient reported feeling better. Pharynx is clear neck is supple chest clear, cardiac exam regular  rate and rhythm. Boggy nasal mucosa bilaterally        Assessment & Plan:  1-benign positional vertigo  2-cerumen left external ear canal  3-bilateral ear congestion  -? Otitis media versus eustachian tube dysfunction  4-possible allergic rhinitis  5-history of paroxysmal atrial fibrillation on chronic anticoagulation therapy  6-pacemaker insertion 2005 for tachybradycardia syndrome  7-essential tremor  8-hypothyroidism  9-severe osteoarthritis both knees  Plan: Patient given 80 mg IM Depo-Medrol today for symptom relief, prescription for Augmentin 500 mg by mouth 3 times daily for 10 days. If symptoms persist, refer to allergist.

## 2010-09-30 NOTE — Patient Instructions (Signed)
Continue Xanax as needed for vertigo and anxiety. Take antibiotic as prescribed for 10 days. He had been given a Depo-Medrol injection today to decrease inflammation in your nasal passages. Please enjoy your trip trip. If symptoms persist upon your return return we [place] need to refer you to allergist

## 2010-10-01 NOTE — Progress Notes (Signed)
  Subjective:    Patient ID: [name], female    DOB: May 16, 1921, 77 y.o.   MRN: [id number] Patient was seen here 09/05/2010 complaining of sore throat and sinus congestion. She was placed on Augmentin 500 mg by mouth 3 times a day. Sinus films were obtained at that time. Had some mild vertigo issues but long-standing history of benign positional vertigo. HPI patient was seen at the urgent care center on June 6. She was then referred to the emergency department for chest x-ray. Chest x-ray showed no pneumonia. CBC was normal. After her visit on 09/07/2010, she discontinued her antibiotics. Diagnosis in emergency department was viral syndrome. She had sinus films done 09/05/2010 showing some possible mild mucosal thickening in the maxillary sinuses. She is now here for followup. She led this office to believe she was diagnosed with pneumonia but checking with the ED records, that is not the case. Patient has some anxiety associated with this illness. Does not understand why she is not improving.    Review of Systems     Objective:   Physical Exam HEENT exam: TMs are chronically scarred; neck is supple without adenopathy; pharynx is clear cardiac exam regular rate and rhythm normal S1/S2; extremities without edema        Assessment & Plan:  1-sinusitis  2-bronchitis  Plan patient given 1 g IM Rocephin and started once again on oral antibiotics. Take as prescribed to complete entire course.

## 2010-10-01 NOTE — Patient Instructions (Signed)
Restart antibiotics call if not better in 7-10 days or sooner if for

## 2010-10-02 LAB — REMOTE PACEMAKER DEVICE
AL AMPLITUDE: 1.7 mv
AL AMPLITUDE: 1.7 mv
AL IMPEDENCE PM: 544 Ohm
ATRIAL PACING PM: 84.51
BATTERY VOLTAGE: 2.96 V
BATTERY VOLTAGE: 2.96 V
BAVD-0002: 350 ms
BMOD-0002: 7
BPAC-0002RA: 2 V
BPAC-0002RV: 3 V
BPAC-0003RA: 0.4 ms
BPAC-0003RV: 0.4 ms
BRDY-0002: 60 {beats}/min
BSEN-0002RA: 0.6 mv
BSEN-0005RA: 150 ms
DEV-0004LDO: 5076
DEV-0004LDO: 5076
DEV-0006LDO: 20051017
DEV-0006PM: 20051017
DEV-0020PM: NEGATIVE
DEV-0023LDO: 0
DEV-0023LDO: 0
DEV-0024PM: 85
DEV-0025PM: 65
EVAL-0003E5: NEGATIVE
RV LEAD AMPLITUDE: 5.5 mv
RV LEAD AMPLITUDE: 5.5 mv
VENTRICULAR PACING PM: 0.74

## 2010-10-04 ENCOUNTER — Encounter: Payer: Self-pay | Admitting: *Deleted

## 2010-10-04 NOTE — Progress Notes (Signed)
Pacer remote check  

## 2010-10-06 ENCOUNTER — Encounter: Payer: Medicare Other | Admitting: *Deleted

## 2010-10-11 LAB — POCT INR: INR: 2.2

## 2010-10-12 ENCOUNTER — Telehealth: Payer: Self-pay | Admitting: Internal Medicine

## 2010-10-12 ENCOUNTER — Ambulatory Visit (INDEPENDENT_AMBULATORY_CARE_PROVIDER_SITE_OTHER): Payer: Self-pay | Admitting: Internal Medicine

## 2010-10-12 DIAGNOSIS — R0989 Other specified symptoms and signs involving the circulatory and respiratory systems: Secondary | ICD-10-CM

## 2010-10-12 NOTE — Telephone Encounter (Signed)
[facility] faxed [facility] @ [phone]  10/12/10/km

## 2010-10-14 ENCOUNTER — Encounter: Payer: Self-pay | Admitting: Cardiology

## 2010-10-14 ENCOUNTER — Ambulatory Visit: Payer: Medicare Other | Admitting: Cardiology

## 2010-10-28 ENCOUNTER — Encounter: Payer: Self-pay | Admitting: Internal Medicine

## 2010-10-31 ENCOUNTER — Ambulatory Visit (INDEPENDENT_AMBULATORY_CARE_PROVIDER_SITE_OTHER): Payer: Self-pay | Admitting: Cardiology

## 2010-10-31 DIAGNOSIS — R0989 Other specified symptoms and signs involving the circulatory and respiratory systems: Secondary | ICD-10-CM

## 2010-11-15 ENCOUNTER — Encounter: Payer: Medicare Other | Admitting: *Deleted

## 2010-11-18 ENCOUNTER — Encounter: Payer: Self-pay | Admitting: Cardiology

## 2010-11-18 ENCOUNTER — Ambulatory Visit (INDEPENDENT_AMBULATORY_CARE_PROVIDER_SITE_OTHER): Payer: Medicare Other | Admitting: Cardiology

## 2010-11-18 DIAGNOSIS — I4891 Unspecified atrial fibrillation: Secondary | ICD-10-CM

## 2010-11-18 DIAGNOSIS — I10 Essential (primary) hypertension: Secondary | ICD-10-CM

## 2010-11-18 DIAGNOSIS — Z950 Presence of cardiac pacemaker: Secondary | ICD-10-CM

## 2010-11-18 NOTE — Patient Instructions (Signed)
Your physician recommends that you schedule a follow-up appointment in: YEAR WITH DR [facility]  Your physician recommends that you continue on your current medications as directed. Please refer to the Current Medication list given to you today.

## 2010-11-18 NOTE — Assessment & Plan Note (Signed)
The blood pressure is at target. No change in medications is indicated. We will continue with therapeutic lifestyle changes (TLC).  

## 2010-11-18 NOTE — Progress Notes (Signed)
HPI The patient presents for follow up.  Since I last saw her she has had problems with vertigo and a fall in [place].  Otherwise, the patient denies any new symptoms such as chest discomfort, neck or arm discomfort. There has been no new shortness of breath, PND or orthopnea. There have been no reported palpitations, presyncope or syncope.  She gets around slowly with her cane.  No Known Allergies  Current Outpatient Prescriptions  Medication Sig Dispense Refill  . albuterol (PROVENTIL HFA;VENTOLIN HFA) 108 (90 BASE) MCG/ACT inhaler Inhale 2 puffs into the lungs every 6 (six) hours as needed.        . ALPRAZolam (XANAX) 0.5 MG tablet Take 0.5 mg by mouth at bedtime as needed.        Marland Kitchen amiodarone (PACERONE) 200 MG tablet Take 200 mg by mouth daily. TAKE 1 TAB ON M, T, W, TH, F...Marland KitchenMarland KitchenON SAT AND SUN TAKE ONLY 1/2 TAB       . Cholecalciferol (VITAMIN D3) 1000 UNITS CAPS Take 1 capsule by mouth daily.        . cycloSPORINE (RESTASIS) 0.05 % ophthalmic emulsion 1 drop 2 (two) times daily.        Marland Kitchen esomeprazole (NEXIUM) 40 MG capsule Take 40 mg by mouth daily before breakfast.        . ferrous sulfate 325 (65 FE) MG tablet Take 325 mg by mouth daily with breakfast.        . levothyroxine (SYNTHROID, LEVOTHROID) 88 MCG tablet Take 88 mcg by mouth daily.        . metoprolol (LOPRESSOR) 50 MG tablet Take 50 mg by mouth daily.        . metroNIDAZOLE (METROGEL) 1 % gel Apply 1 application topically daily.        . Multiple Vitamins-Minerals (ICAPS) CAPS Take 1 capsule by mouth daily.        . psyllium (METAMUCIL) 58.6 % powder Take 1 packet by mouth daily.        Marland Kitchen triamterene-hydrochlorothiazide (MAXZIDE-25) 37.5-25 MG per tablet Take 1 tablet by mouth daily.        Marland Kitchen warfarin (COUMADIN) 4 MG tablet Take 4 mg by mouth as directed.          Past Medical History  Diagnosis Date  . Symptomatic sinus bradycardia     s/p pacemaker  . Atrial flutter     s/p RFCA  . Atrial fibrillation   . Hypothyroidism    . GERD (gastroesophageal reflux disease)   . Esophageal stricture   . Anemia   . HTN (hypertension)     a.  echo 3/06: EF 55-65%, mild LVH, mild RVH  . Asthma   . Tremor, essential   . Diverticulosis   . Vertigo   . Urinary, incontinence, stress female   . DJD (degenerative joint disease)     knees  . Renal insufficiency   . Hearing loss     Past Surgical History  Procedure Date  . Pacemaker insertion   . Radiofrequency catheter ablation     ROS:  Vertigo, recent fall with rib pain.  Otherwise as stated in the HPI and negative for all other systems.  PHYSICAL EXAM BP 122/68  Pulse 62  Ht 5\' 6"  (1.676 m)  Wt 158 lb (71.668 kg)  BMI 25.50 kg/m2 GENERAL:  Well appearing HEENT:  Pupils equal round and reactive, fundi not visualized, oral mucosa unremarkable NECK:  No jugular venous distention, waveform within normal limits, carotid upstroke  brisk and symmetric, no bruits, no thyromegaly LYMPHATICS:  No cervical, inguinal adenopathy LUNGS:  Clear to auscultation bilaterally BACK:  No CVA tenderness CHEST:  Pacer pocket OK. HEART:  PMI not displaced or sustained,S1 and S2 within normal limits, no S3, no S4, no clicks, no rubs, no murmurs ABD:  Flat, positive bowel sounds normal in frequency in pitch, no bruits, no rebound, no guarding, no midline pulsatile mass, no hepatomegaly, no splenomegaly EXT:  2 plus pulses throughout, mild edema, no cyanosis no clubbing SKIN:  No rashes no nodules NEURO:  Cranial nerves II through XII grossly intact, motor grossly intact throughout PSYCH:  Cognitively intact, oriented to person place and time  ASSESSMENT AND PLAN

## 2010-11-18 NOTE — Assessment & Plan Note (Signed)
She is up to date with follow up.

## 2010-11-18 NOTE — Assessment & Plan Note (Signed)
She will continue the meds as listed.  I will ask Margaree Mackintosh, MD, MD to check her TSH and liver profile to follow up the [facility] when she has her next blood draw.

## 2010-11-24 ENCOUNTER — Ambulatory Visit (INDEPENDENT_AMBULATORY_CARE_PROVIDER_SITE_OTHER): Payer: Medicare Other | Admitting: Internal Medicine

## 2010-11-24 ENCOUNTER — Encounter: Payer: Self-pay | Admitting: Internal Medicine

## 2010-11-24 DIAGNOSIS — I4891 Unspecified atrial fibrillation: Secondary | ICD-10-CM

## 2010-11-24 DIAGNOSIS — K219 Gastro-esophageal reflux disease without esophagitis: Secondary | ICD-10-CM

## 2010-11-24 DIAGNOSIS — M170 Bilateral primary osteoarthritis of knee: Secondary | ICD-10-CM

## 2010-11-24 DIAGNOSIS — E785 Hyperlipidemia, unspecified: Secondary | ICD-10-CM

## 2010-11-24 DIAGNOSIS — I10 Essential (primary) hypertension: Secondary | ICD-10-CM

## 2010-11-24 DIAGNOSIS — Z950 Presence of cardiac pacemaker: Secondary | ICD-10-CM

## 2010-11-24 DIAGNOSIS — H8110 Benign paroxysmal vertigo, unspecified ear: Secondary | ICD-10-CM

## 2010-11-24 DIAGNOSIS — E039 Hypothyroidism, unspecified: Secondary | ICD-10-CM

## 2010-11-24 DIAGNOSIS — H9190 Unspecified hearing loss, unspecified ear: Secondary | ICD-10-CM

## 2010-11-24 DIAGNOSIS — M1710 Unilateral primary osteoarthritis, unspecified knee: Secondary | ICD-10-CM

## 2010-11-24 DIAGNOSIS — N289 Disorder of kidney and ureter, unspecified: Secondary | ICD-10-CM

## 2010-11-24 DIAGNOSIS — I480 Paroxysmal atrial fibrillation: Secondary | ICD-10-CM

## 2010-11-24 DIAGNOSIS — H9193 Unspecified hearing loss, bilateral: Secondary | ICD-10-CM

## 2010-11-24 DIAGNOSIS — N393 Stress incontinence (female) (male): Secondary | ICD-10-CM

## 2010-11-24 DIAGNOSIS — Z8709 Personal history of other diseases of the respiratory system: Secondary | ICD-10-CM

## 2010-11-24 DIAGNOSIS — D649 Anemia, unspecified: Secondary | ICD-10-CM

## 2010-11-24 LAB — HEPATIC FUNCTION PANEL
ALT: 12 U/L (ref 0–35)
AST: 16 U/L (ref 0–37)
Albumin: 3.9 g/dL (ref 3.5–5.2)
Alkaline Phosphatase: 79 U/L (ref 39–117)
Total Protein: 6.3 g/dL (ref 6.0–8.3)

## 2010-11-28 ENCOUNTER — Encounter: Payer: Self-pay | Admitting: Internal Medicine

## 2010-11-29 ENCOUNTER — Encounter: Payer: Self-pay | Admitting: Internal Medicine

## 2010-11-29 ENCOUNTER — Other Ambulatory Visit: Payer: Self-pay | Admitting: *Deleted

## 2010-11-29 DIAGNOSIS — E039 Hypothyroidism, unspecified: Secondary | ICD-10-CM | POA: Insufficient documentation

## 2010-11-29 DIAGNOSIS — N393 Stress incontinence (female) (male): Secondary | ICD-10-CM | POA: Insufficient documentation

## 2010-11-29 DIAGNOSIS — H9193 Unspecified hearing loss, bilateral: Secondary | ICD-10-CM | POA: Insufficient documentation

## 2010-11-29 DIAGNOSIS — D649 Anemia, unspecified: Secondary | ICD-10-CM | POA: Insufficient documentation

## 2010-11-29 MED ORDER — AMIODARONE HCL 200 MG PO TABS: 200.0000 mg | ORAL_TABLET | Freq: Every day | ORAL | Status: DC

## 2010-11-29 NOTE — Progress Notes (Signed)
  Subjective:    Patient ID: [name], female    DOB: 08/02/1921, 77 y.o.   MRN: [id number]  HPI she has been visiting family in [place] and [place] and was gone for about 6 weeks. She lost her balance while visiting in [place] and took a fairly severe fall but fortunately was not injured severely. Recently has had a little bit of vertigo. Recently saw Dr. Antoine Poche and got a good report. Remains on Coumadin. This is of concern with her falls. TSH and hepatic panel drawn today. History of hypothyroidism. History of GE reflux, had pacemaker insertion in 2005, benign positional vertigo, diverticulosis, degenerative joint disease of the knees, paroxysmal atrial fibrillation on chronic Coumadin therapy, renal insufficiency, hearing loss in both ears, iron deficiency anemia.  Schatzki's ring dilated 1992, stricture in the esophagus dilated 1996, right middle lobe pneumonia 1996, strep throat treated in [place] in 1998. Last mammogram August 2011.  Cataract extraction right 1992, cataract extraction left 1993.  Pneumovax vaccine January 2011, influenza vaccine September 2011, Zostavax vaccine January 2012. Colonoscopy April 2001.    Review of Systems     Objective:   Physical Exam neck is supple, no thyromegaly. Chest clear to auscultation. Cardiac exam: Regular rate and rhythm  Extremities without edema. She ambulates with a cane. Bilateral tremor hands and head.        Assessment & Plan:  History of paroxysmal atrial fibrillation with history of pacemaker insertion 2005  Hypothyroidism  Renal insufficiency  Hearing loss both ears  History of anemia  History of essential tremor  GE reflux status post dilatation of Schatzki's ring 1992 an esophageal stricture dilated 1996  Diverticulosis  Benign positional vertigo  Stress urinary incontinence  Degenerative joint disease of the knees  Plan is to continue same medications. TSH and liver panel drawn today. Return in 6 months  for physical exam.

## 2010-11-30 ENCOUNTER — Ambulatory Visit (INDEPENDENT_AMBULATORY_CARE_PROVIDER_SITE_OTHER): Payer: Medicare Other | Admitting: *Deleted

## 2010-11-30 DIAGNOSIS — I4892 Unspecified atrial flutter: Secondary | ICD-10-CM

## 2010-11-30 DIAGNOSIS — I4891 Unspecified atrial fibrillation: Secondary | ICD-10-CM

## 2010-11-30 DIAGNOSIS — Z7901 Long term (current) use of anticoagulants: Secondary | ICD-10-CM

## 2010-12-12 ENCOUNTER — Other Ambulatory Visit: Payer: Self-pay | Admitting: Internal Medicine

## 2010-12-12 DIAGNOSIS — Z1231 Encounter for screening mammogram for malignant neoplasm of breast: Secondary | ICD-10-CM

## 2010-12-13 ENCOUNTER — Ambulatory Visit: Payer: Medicare Other

## 2010-12-14 ENCOUNTER — Encounter: Payer: Medicare Other | Admitting: *Deleted

## 2010-12-15 ENCOUNTER — Ambulatory Visit (INDEPENDENT_AMBULATORY_CARE_PROVIDER_SITE_OTHER): Payer: Medicare Other | Admitting: *Deleted

## 2010-12-15 DIAGNOSIS — I4891 Unspecified atrial fibrillation: Secondary | ICD-10-CM

## 2010-12-15 DIAGNOSIS — Z7901 Long term (current) use of anticoagulants: Secondary | ICD-10-CM

## 2010-12-15 DIAGNOSIS — I4892 Unspecified atrial flutter: Secondary | ICD-10-CM

## 2010-12-15 LAB — POCT INR: INR: 2.4

## 2010-12-19 ENCOUNTER — Ambulatory Visit
Admission: RE | Admit: 2010-12-19 | Discharge: 2010-12-19 | Disposition: A | Discharge status: Home / Self Care | Payer: Medicare Other | Source: Ambulatory Visit | Attending: Internal Medicine | Admitting: Internal Medicine

## 2010-12-19 DIAGNOSIS — Z1231 Encounter for screening mammogram for malignant neoplasm of breast: Secondary | ICD-10-CM

## 2010-12-22 ENCOUNTER — Other Ambulatory Visit: Payer: Self-pay

## 2010-12-22 ENCOUNTER — Other Ambulatory Visit: Payer: Self-pay | Admitting: Internal Medicine

## 2010-12-22 MED ORDER — LEVOTHYROXINE SODIUM 88 MCG PO TABS: 88.0000 ug | ORAL_TABLET | Freq: Every day | ORAL | Status: DC

## 2010-12-29 ENCOUNTER — Ambulatory Visit (INDEPENDENT_AMBULATORY_CARE_PROVIDER_SITE_OTHER): Payer: Medicare Other | Admitting: *Deleted

## 2010-12-29 ENCOUNTER — Encounter: Payer: Self-pay | Admitting: Internal Medicine

## 2010-12-29 ENCOUNTER — Other Ambulatory Visit: Payer: Self-pay | Admitting: Internal Medicine

## 2010-12-29 DIAGNOSIS — I495 Sick sinus syndrome: Secondary | ICD-10-CM

## 2010-12-30 LAB — REMOTE PACEMAKER DEVICE
AL AMPLITUDE: 2.2 mv
AL IMPEDENCE PM: 608 Ohm
ATRIAL PACING PM: 83.15
BAMS-0001: 170 {beats}/min
BATTERY VOLTAGE: 2.96 V
RV LEAD AMPLITUDE: 6.9 mv
RV LEAD IMPEDENCE PM: 408 Ohm
VENTRICULAR PACING PM: 2.78

## 2011-01-04 ENCOUNTER — Encounter: Payer: Self-pay | Admitting: *Deleted

## 2011-01-05 ENCOUNTER — Ambulatory Visit (INDEPENDENT_AMBULATORY_CARE_PROVIDER_SITE_OTHER): Payer: Medicare Other | Admitting: Internal Medicine

## 2011-01-05 ENCOUNTER — Ambulatory Visit (INDEPENDENT_AMBULATORY_CARE_PROVIDER_SITE_OTHER): Payer: Medicare Other | Admitting: *Deleted

## 2011-01-05 DIAGNOSIS — Z23 Encounter for immunization: Secondary | ICD-10-CM

## 2011-01-05 DIAGNOSIS — Z7901 Long term (current) use of anticoagulants: Secondary | ICD-10-CM

## 2011-01-05 DIAGNOSIS — I4891 Unspecified atrial fibrillation: Secondary | ICD-10-CM

## 2011-01-05 DIAGNOSIS — I4892 Unspecified atrial flutter: Secondary | ICD-10-CM

## 2011-01-05 NOTE — Progress Notes (Signed)
Pacer remote check  

## 2011-02-02 ENCOUNTER — Ambulatory Visit (INDEPENDENT_AMBULATORY_CARE_PROVIDER_SITE_OTHER): Payer: Medicare Other | Admitting: *Deleted

## 2011-02-02 DIAGNOSIS — I4891 Unspecified atrial fibrillation: Secondary | ICD-10-CM

## 2011-02-02 DIAGNOSIS — Z7901 Long term (current) use of anticoagulants: Secondary | ICD-10-CM

## 2011-02-02 DIAGNOSIS — I4892 Unspecified atrial flutter: Secondary | ICD-10-CM

## 2011-02-02 LAB — POCT INR: INR: 2.6

## 2011-02-18 ENCOUNTER — Other Ambulatory Visit: Payer: Self-pay | Admitting: Internal Medicine

## 2011-02-20 ENCOUNTER — Other Ambulatory Visit: Payer: Self-pay

## 2011-02-20 MED ORDER — ALPRAZOLAM 0.5 MG PO TABS: 0.5000 mg | ORAL_TABLET | Freq: Three times a day (TID) | ORAL | Status: DC | PRN

## 2011-03-02 ENCOUNTER — Ambulatory Visit (INDEPENDENT_AMBULATORY_CARE_PROVIDER_SITE_OTHER): Payer: Medicare Other | Admitting: *Deleted

## 2011-03-02 DIAGNOSIS — I4892 Unspecified atrial flutter: Secondary | ICD-10-CM

## 2011-03-02 DIAGNOSIS — Z7901 Long term (current) use of anticoagulants: Secondary | ICD-10-CM

## 2011-03-02 DIAGNOSIS — I4891 Unspecified atrial fibrillation: Secondary | ICD-10-CM

## 2011-03-02 LAB — POCT INR: INR: 2.4

## 2011-03-07 ENCOUNTER — Encounter: Payer: Self-pay | Admitting: Internal Medicine

## 2011-03-07 ENCOUNTER — Ambulatory Visit
Admission: RE | Admit: 2011-03-07 | Discharge: 2011-03-07 | Disposition: A | Discharge status: Home / Self Care | Payer: Medicare Other | Source: Ambulatory Visit | Attending: Internal Medicine | Admitting: Internal Medicine

## 2011-03-07 ENCOUNTER — Ambulatory Visit (INDEPENDENT_AMBULATORY_CARE_PROVIDER_SITE_OTHER): Payer: Medicare Other | Admitting: Internal Medicine

## 2011-03-07 VITALS — BP 160/74 | HR 72 | Temp 97.4°F | Wt 154.0 lb

## 2011-03-07 DIAGNOSIS — F419 Anxiety disorder, unspecified: Secondary | ICD-10-CM

## 2011-03-07 DIAGNOSIS — F411 Generalized anxiety disorder: Secondary | ICD-10-CM

## 2011-03-07 DIAGNOSIS — R42 Dizziness and giddiness: Secondary | ICD-10-CM

## 2011-03-07 DIAGNOSIS — J309 Allergic rhinitis, unspecified: Secondary | ICD-10-CM

## 2011-03-07 MED ORDER — METHYLPREDNISOLONE ACETATE 80 MG/ML IJ SUSP
80.0000 mg | Freq: Once | INTRAMUSCULAR | Status: AC
  Administered 2011-03-07: 80 mg via INTRAMUSCULAR

## 2011-03-08 NOTE — Patient Instructions (Signed)
Please have sinus films today. Take Xanax as previously prescribed. Please feel prescription for Zithromax 2 tablets by mouth day one followed by 1 tablet by mouth days 2 through 5. Call if not better in one week or sooner if worse

## 2011-03-08 NOTE — Progress Notes (Signed)
  Subjective:    Patient ID: [name], female    DOB: January 15, 1922, 77 y.o.   MRN: [id number]  HPI patient complaining of runny nose, nasal congestion, "funny feeling in head ". Thinks she [place] have a sinus infection. No fever. Has had more issues with benign positional vertigo with position change. This is a long-standing problem. Admits to being anxious and worried about her children. No discolored nasal drainage. Frequently has clear nasal discharge. No headache. She's had extensive evaluations of vertigo in the past including a couple of MRIs. Seems to be becoming a bit forgetful. Continues to drive somewhere around town. Lives alone. Ambulates with a cane.   Review of Systems     Objective:   Physical Exam TMs are clear; pharynx is clear. Boggy nasal mucosa. Chest is clear.        Assessment & Plan:  Possible sinusitis  Allergic rhinitis  Benign positional vertigo  Anxiety   Plan: She will have sinus films. Continue with Xanax as previously prescribed for vertigo and anxiety. Prescribe Zithromax Z-Pak take 2 tablets by mouth day one followed by 1 tablet by mouth days 2 through 5. If vertigo persists, consider referral to ENT physician or neurologist.

## 2011-04-11 ENCOUNTER — Other Ambulatory Visit: Payer: Self-pay | Admitting: Internal Medicine

## 2011-04-17 ENCOUNTER — Encounter: Payer: Medicare Other | Admitting: *Deleted

## 2011-04-17 DIAGNOSIS — I4891 Unspecified atrial fibrillation: Secondary | ICD-10-CM | POA: Diagnosis not present

## 2011-04-18 ENCOUNTER — Encounter: Payer: Medicare Other | Admitting: Internal Medicine

## 2011-04-24 ENCOUNTER — Ambulatory Visit (INDEPENDENT_AMBULATORY_CARE_PROVIDER_SITE_OTHER): Payer: Self-pay | Admitting: Cardiovascular Disease

## 2011-04-24 DIAGNOSIS — Z7901 Long term (current) use of anticoagulants: Secondary | ICD-10-CM

## 2011-04-24 DIAGNOSIS — R0989 Other specified symptoms and signs involving the circulatory and respiratory systems: Secondary | ICD-10-CM

## 2011-04-24 DIAGNOSIS — I4891 Unspecified atrial fibrillation: Secondary | ICD-10-CM

## 2011-04-24 DIAGNOSIS — I4892 Unspecified atrial flutter: Secondary | ICD-10-CM

## 2011-05-10 ENCOUNTER — Other Ambulatory Visit: Payer: Self-pay | Admitting: Internal Medicine

## 2011-05-22 ENCOUNTER — Ambulatory Visit (INDEPENDENT_AMBULATORY_CARE_PROVIDER_SITE_OTHER): Payer: Medicare Other | Admitting: Pharmacist

## 2011-05-22 DIAGNOSIS — I4892 Unspecified atrial flutter: Secondary | ICD-10-CM

## 2011-05-22 DIAGNOSIS — I4891 Unspecified atrial fibrillation: Secondary | ICD-10-CM | POA: Diagnosis not present

## 2011-05-22 DIAGNOSIS — Z7901 Long term (current) use of anticoagulants: Secondary | ICD-10-CM

## 2011-05-22 LAB — POCT INR: INR: 1.3

## 2011-05-25 ENCOUNTER — Encounter: Payer: Self-pay | Admitting: Internal Medicine

## 2011-05-25 ENCOUNTER — Ambulatory Visit (INDEPENDENT_AMBULATORY_CARE_PROVIDER_SITE_OTHER): Payer: Medicare Other | Admitting: Internal Medicine

## 2011-05-25 DIAGNOSIS — Z950 Presence of cardiac pacemaker: Secondary | ICD-10-CM | POA: Diagnosis not present

## 2011-05-25 DIAGNOSIS — I4891 Unspecified atrial fibrillation: Secondary | ICD-10-CM

## 2011-05-25 DIAGNOSIS — I10 Essential (primary) hypertension: Secondary | ICD-10-CM | POA: Diagnosis not present

## 2011-05-25 DIAGNOSIS — I4892 Unspecified atrial flutter: Secondary | ICD-10-CM | POA: Diagnosis not present

## 2011-05-25 LAB — PACEMAKER DEVICE OBSERVATION
AL AMPLITUDE: 3.3 mv
AL IMPEDENCE PM: 696 Ohm
BATTERY VOLTAGE: 2.95 V
RV LEAD AMPLITUDE: 8.2 mv
RV LEAD IMPEDENCE PM: 400 Ohm

## 2011-05-25 NOTE — Assessment & Plan Note (Signed)
She is maintaining sinus rhythm nearly 97% of the time. She will continue her current dose of amiodarone.

## 2011-05-25 NOTE — Assessment & Plan Note (Signed)
Her blood pressure remains fairly well-controlled. She will continue her current medications and maintain a low-sodium diet. 

## 2011-05-25 NOTE — Progress Notes (Signed)
HPI [name] returns today for followup. m she is a very pleasant elderly woman with a history of hypertension, symptomatic bradycardia, status post permanent pacemaker insertion. Just was a history of atrial fibrillation but is maintaining sinus rhythm very nicely on amiodarone. She denies chest pain, shortness of breath, or peripheral edema. She has rare palpitations when she lays down to go to sleep at night. These resolved within a few minutes. No Known Allergies   Current Outpatient Prescriptions  Medication Sig Dispense Refill  . albuterol (PROVENTIL HFA;VENTOLIN HFA) 108 (90 BASE) MCG/ACT inhaler Inhale 2 puffs into the lungs every 6 (six) hours as needed.        . ALPRAZolam (XANAX) 0.5 MG tablet Take 1 tablet (0.5 mg total) by mouth 3 (three) times daily as needed.  90 tablet  5  . amiodarone (PACERONE) 200 MG tablet Take 1 tablet (200 mg total) by mouth daily. TAKE 1 TAB ON M, T, W, TH, F...Marland KitchenMarland KitchenON SAT AND SUN TAKE ONLY 1/2 TAB  90 tablet  2  . Cholecalciferol (VITAMIN D3) 1000 UNITS CAPS Take 1 capsule by mouth daily.        . cycloSPORINE (RESTASIS) 0.05 % ophthalmic emulsion 1 drop 2 (two) times daily.        Marland Kitchen esomeprazole (NEXIUM) 40 MG capsule Take 40 mg by mouth daily before breakfast.        . ferrous sulfate 325 (65 FE) MG tablet Take 325 mg by mouth daily with breakfast.        . levothyroxine (SYNTHROID, LEVOTHROID) 88 MCG tablet Take 1 tablet (88 mcg total) by mouth daily.  90 tablet  1  . metoprolol (LOPRESSOR) 50 MG tablet TAKE 1 TABLET EVERY DAY  90 tablet  10  . metroNIDAZOLE (METROGEL) 1 % gel Apply 1 application topically daily.        . Multiple Vitamins-Minerals (ICAPS) CAPS Take 2 capsules by mouth daily.       . psyllium (METAMUCIL) 58.6 % powder Take 1 packet by mouth daily.        Marland Kitchen triamterene-hydrochlorothiazide (MAXZIDE-25) 37.5-25 MG per tablet TAKE 1 TABLET BY MOUTH EVERY DAY  90 tablet  0  . warfarin (COUMADIN) 4 MG tablet TAKE 1 TABLET EVERY DAY EXCEPT TAKE 2  TABLETS ON FRIDAYS  102 tablet  2     Past Medical History  Diagnosis Date  . Symptomatic sinus bradycardia     s/p pacemaker  . Atrial flutter     s/p RFCA  . Atrial fibrillation   . Hypothyroidism   . GERD (gastroesophageal reflux disease)   . Esophageal stricture   . Anemia   . HTN (hypertension)     a.  echo 3/06: EF 55-65%, mild LVH, mild RVH  . Asthma   . Tremor, essential   . Diverticulosis   . Vertigo   . Urinary, incontinence, stress female   . DJD (degenerative joint disease)     knees  . Renal insufficiency   . Hearing loss   . Macular degeneration     ROS:   All systems reviewed and negative except as noted in the HPI.   Past Surgical History  Procedure Date  . Pacemaker insertion   . Radiofrequency catheter ablation      Family History  Problem Relation Age of Onset  . Cancer Mother     breast  . Cancer Father     leukemia  . Cirrhosis Brother     deceased  History   Social History  . Marital Status: Widowed    Spouse Name: N/A    Number of Children: 2  . Years of Education: N/A   Occupational History  . retired Optometrist    Social History Main Topics  . Smoking status: Former Smoker    Types: Cigarettes    Quit date: 02/04/1962  . Smokeless tobacco: Never Used  . Alcohol Use: Yes     rarely  . Drug Use: No  . Sexually Active: Not on file   Other Topics Concern  . Not on file   Social History Narrative  . No narrative on file     BP 136/60  Pulse 72  Ht 5\' 6"  (1.676 m)  Wt 69.455 kg (153 lb 1.9 oz)  BMI 24.71 kg/m2  Physical Exam:  Well appearing elderly woman, NAD HEENT: Unremarkable Neck:  No JVD, no thyromegally Lungs:  Clear with no wheezes, rales, or rhonchi. HEART:  Regular rate rhythm, no murmurs, no rubs, no clicks Abd:  soft, positive bowel sounds, no organomegally, no rebound, no guarding Ext:  2 plus pulses, no edema, no cyanosis, no clubbing Skin:  No rashes no nodules Neuro:  CN II through  XII intact, motor grossly intact  DEVICE  Normal device function.  See PaceArt for details.   Assess/Plan:

## 2011-05-25 NOTE — Assessment & Plan Note (Signed)
Her device is working normally. Plan recheck in several months. 

## 2011-05-25 NOTE — Patient Instructions (Signed)
Your physician wants you to follow-up in: 12 months with Dr Court Joy will receive a reminder letter in the mail two months in advance. If you don't receive a letter, please call our office to schedule the follow-up appointment.   Remote monitoring is used to monitor your Pacemaker of ICD from home. This monitoring reduces the number of office visits required to check your device to one time per year. It allows [place] to keep an eye on the functioning of your device to ensure it is working properly. You are scheduled for a device check from home on 08/24/2011. You Floren send your transmission at any time that day. If you have a wireless device, the transmission will be sent automatically. After your physician reviews your transmission, you will receive a postcard with your next transmission date.

## 2011-05-27 ENCOUNTER — Other Ambulatory Visit: Payer: Self-pay | Admitting: Internal Medicine

## 2011-05-31 DIAGNOSIS — H353190 Nonexudative age-related macular degeneration, unspecified eye, stage unspecified: Secondary | ICD-10-CM | POA: Diagnosis not present

## 2011-06-01 ENCOUNTER — Ambulatory Visit (INDEPENDENT_AMBULATORY_CARE_PROVIDER_SITE_OTHER): Payer: Medicare Other | Admitting: *Deleted

## 2011-06-01 DIAGNOSIS — I4891 Unspecified atrial fibrillation: Secondary | ICD-10-CM | POA: Diagnosis not present

## 2011-06-01 DIAGNOSIS — Z7901 Long term (current) use of anticoagulants: Secondary | ICD-10-CM

## 2011-06-01 DIAGNOSIS — I4892 Unspecified atrial flutter: Secondary | ICD-10-CM | POA: Diagnosis not present

## 2011-06-01 LAB — POCT INR: INR: 2

## 2011-06-06 ENCOUNTER — Other Ambulatory Visit: Payer: Self-pay

## 2011-06-06 MED ORDER — ESOMEPRAZOLE MAGNESIUM 40 MG PO CPDR: 40.0000 mg | DELAYED_RELEASE_CAPSULE | Freq: Every day | ORAL | Status: DC

## 2011-06-09 ENCOUNTER — Ambulatory Visit (INDEPENDENT_AMBULATORY_CARE_PROVIDER_SITE_OTHER): Payer: Medicare Other | Admitting: Internal Medicine

## 2011-06-09 ENCOUNTER — Encounter: Payer: Self-pay | Admitting: Internal Medicine

## 2011-06-09 DIAGNOSIS — Z0000 Encounter for general adult medical examination without abnormal findings: Secondary | ICD-10-CM

## 2011-06-09 DIAGNOSIS — E559 Vitamin D deficiency, unspecified: Secondary | ICD-10-CM | POA: Diagnosis not present

## 2011-06-09 DIAGNOSIS — I4891 Unspecified atrial fibrillation: Secondary | ICD-10-CM

## 2011-06-09 DIAGNOSIS — D649 Anemia, unspecified: Secondary | ICD-10-CM

## 2011-06-09 DIAGNOSIS — I10 Essential (primary) hypertension: Secondary | ICD-10-CM

## 2011-06-09 DIAGNOSIS — R82998 Other abnormal findings in urine: Secondary | ICD-10-CM | POA: Diagnosis not present

## 2011-06-09 DIAGNOSIS — E039 Hypothyroidism, unspecified: Secondary | ICD-10-CM | POA: Diagnosis not present

## 2011-06-09 LAB — LIPID PANEL
HDL: 64 mg/dL (ref >39)
LDL Cholesterol: 92 mg/dL (ref 0–99)
Total CHOL/HDL Ratio: 2.8 Ratio
VLDL: 26 mg/dL (ref 0–40)

## 2011-06-09 LAB — COMPREHENSIVE METABOLIC PANEL
ALT: 9 U/L (ref 0–35)
AST: 11 U/L (ref 0–37)
Alkaline Phosphatase: 62 U/L (ref 39–117)
BUN: 28 mg/dL — ABNORMAL HIGH (ref 6–23)
Calcium: 8.9 mg/dL (ref 8.4–10.5)
Chloride: 103 mEq/L (ref 96–112)
Creat: 1.86 mg/dL — ABNORMAL HIGH (ref 0.50–1.10)
Total Bilirubin: 0.6 mg/dL (ref 0.3–1.2)

## 2011-06-09 LAB — CBC WITH DIFFERENTIAL/PLATELET
Basophils Absolute: 0.1 10*3/uL (ref 0.0–0.1)
Basophils Relative: 1 % (ref 0–1)
Eosinophils Absolute: 1 10*3/uL — ABNORMAL HIGH (ref 0.0–0.7)
Eosinophils Relative: 15 % — ABNORMAL HIGH (ref 0–5)
Hemoglobin: 11.6 g/dL — ABNORMAL LOW (ref 12.0–15.0)
Lymphocytes Relative: 22 % (ref 12–46)
MCH: 31.9 pg (ref 26.0–34.0)
Monocytes Absolute: 0.7 10*3/uL (ref 0.1–1.0)
Platelets: 236 10*3/uL (ref 150–400)
RBC: 3.64 MIL/uL — ABNORMAL LOW (ref 3.87–5.11)

## 2011-06-09 LAB — POCT URINALYSIS DIPSTICK
Bilirubin, UA: NEGATIVE
Blood, UA: NEGATIVE
Glucose, UA: NEGATIVE
Ketones, UA: NEGATIVE
Nitrite, UA: NEGATIVE

## 2011-06-10 LAB — VITAMIN D 25 HYDROXY (VIT D DEFICIENCY, FRACTURES): Vit D, 25-Hydroxy: 44 ng/mL (ref 30–89)

## 2011-06-12 ENCOUNTER — Telehealth: Payer: Self-pay

## 2011-06-12 LAB — URINE CULTURE: Colony Count: >=100000

## 2011-06-12 MED ORDER — AMOXICILLIN 500 MG PO CAPS: 500.0000 mg | ORAL_CAPSULE | Freq: Three times a day (TID) | ORAL | Status: AC

## 2011-06-12 NOTE — Telephone Encounter (Signed)
Patient has UTI by urine culture. Amoxicillin called to [address]. Will return in 2 weeks for U/a only.

## 2011-06-17 ENCOUNTER — Encounter: Payer: Self-pay | Admitting: Internal Medicine

## 2011-06-17 NOTE — Patient Instructions (Signed)
Continue same medications and return in 6 months 

## 2011-06-17 NOTE — Progress Notes (Signed)
Subjective:    Patient ID: [name], female    DOB: 1922-03-24, 78 y.o.   MRN: [id number]  HPI 78 year old white female with history of essential tremor, GE reflux causing esophageal stricture which was dilated in 1996, diverticulosis, benign positional vertigo, stress urinary incontinence, osteoarthritis, paroxysmal atrial fibrillation, pacemaker for sick sinus syndrome, chronic kidney disease, hearing loss both the ears, history of anemia, history of Schatzki's ring 1992, anxiety or health maintenance and evaluation of medical problems. Right now patient just returning from visit to see relatives which she enjoyed very much. She is in good spirits and feeling pretty well. No particular complaints today. Vertigo seems stable. She tends to have bouts of vertigo that is helped by taking Xanax.  Had carotid Dopplers 2003 that showed no ICA stenosis. Is followed by Dr. Sharrell Ku and Dr. Antoine Poche for history of tachybradycardia syndrome with pacemaker placement in in 2005. History of paroxysmal atrial fibrillation on chronic anticoagulation therapy. Is also on amiodarone. Long-standing history of hypothyroidism well before she was placed on amiodarone.  Had colonoscopy in 2001 and this will not be repeated at her age. Cataract extraction right eye 1992, left in 1993. History of right middle lobe pneumonia in 1996. Treated for strep throat in [place] in 1998.  Continues to live alone. She is a widow. In she formerly smoked a pack of cigarettes daily for some 4 or 5 years but quit in the 1960s. Has a daughter who resides in [place]. Son resides in [place]. She was widowed in the early 1990s.  Family history father died at age 48 with acute leukemia. Mother died at age 83 with kidney failure. One brother died at age 56 of cirrhosis of the liver. One sister.  Patient's tremor started in the early 1990s. He was present when I started seeing her in 1993.  Patient is a retired Database administrator. She has maintained good physical conditioning.    Review of Systems  Constitutional: Negative.   HENT: Positive for hearing loss.        Wears bilateral hearing aids  Eyes: Negative.   Respiratory: Negative.   Gastrointestinal:       History of esophageal stricture and GE reflux  Genitourinary:       Stress urinary incontinence  Musculoskeletal:       Osteoarthritis of the knees  Neurological:       Recurrent benign positional vertigo attacks, history of anxiety, essential tremor  Hematological:       History of anemia  Psychiatric/Behavioral:       Gets anxious       Objective:   Physical Exam  Constitutional: She is oriented to person, place, and time. She appears well-developed and well-nourished.  HENT:  Head: Atraumatic.  Right Ear: External ear normal.  Left Ear: External ear normal.  Mouth/Throat: No oropharyngeal exudate.  Eyes: Conjunctivae and EOM are normal. Pupils are equal, round, and reactive to light. Right eye exhibits no discharge. Left eye exhibits no discharge. No scleral icterus.  Neck: No JVD present. No thyromegaly present.  Cardiovascular: Normal rate, regular rhythm and normal heart sounds.   No murmur heard. Pulmonary/Chest: Effort normal and breath sounds normal. No respiratory distress. She has no wheezes. She has no rales.  Abdominal: Soft. Bowel sounds are normal. She exhibits no distension and no mass. There is no rebound and no guarding.  Genitourinary:       Deferred  Musculoskeletal: Normal range of motion. She exhibits no edema.  Lymphadenopathy:    She has no cervical adenopathy.  Neurological: She is alert and oriented to person, place, and time. She has normal reflexes. No cranial nerve deficit.       Walks with a cane  Skin: Skin is warm and dry. No rash noted.  Psychiatric: She has a normal mood and affect. Her behavior is normal. Judgment and thought content normal.          Assessment & Plan:  History of  essential tremor dating back to the early 1990s  History of GE reflux with stricture dilated 1996  Hypothyroidism  History of paroxysmal atrial fibrillation  History of tachybradycardia syndrome requiring pacemaker insertion 2005 complicated by pneumothorax  Chronic kidney disease  Hearing loss both the ears  History of anemia  Osteoarthritis of the knees  History of stress urinary incontinence  History of benign positional vertigo  History of anxie  Chronic anticoagulation therapy  Plan: Continue same medications and return in 6 months.

## 2011-06-22 ENCOUNTER — Encounter: Payer: Self-pay | Admitting: Internal Medicine

## 2011-06-22 ENCOUNTER — Ambulatory Visit (INDEPENDENT_AMBULATORY_CARE_PROVIDER_SITE_OTHER): Payer: Medicare Other | Admitting: *Deleted

## 2011-06-22 ENCOUNTER — Ambulatory Visit (INDEPENDENT_AMBULATORY_CARE_PROVIDER_SITE_OTHER): Payer: Medicare Other | Admitting: Internal Medicine

## 2011-06-22 DIAGNOSIS — N390 Urinary tract infection, site not specified: Secondary | ICD-10-CM

## 2011-06-22 DIAGNOSIS — I4892 Unspecified atrial flutter: Secondary | ICD-10-CM | POA: Diagnosis not present

## 2011-06-22 DIAGNOSIS — I4891 Unspecified atrial fibrillation: Secondary | ICD-10-CM

## 2011-06-22 DIAGNOSIS — Z7901 Long term (current) use of anticoagulants: Secondary | ICD-10-CM

## 2011-06-22 LAB — POCT URINALYSIS DIPSTICK
Blood, UA: NEGATIVE
Glucose, UA: NEGATIVE
Nitrite, UA: NEGATIVE
Protein, UA: NEGATIVE
Urobilinogen, UA: NEGATIVE
pH, UA: 7

## 2011-06-22 LAB — POCT INR: INR: 1.5

## 2011-06-22 NOTE — Progress Notes (Signed)
  Subjective:    Patient ID: [name], female    DOB: 1922/01/25, 78 y.o.   MRN: [id number]  HPI For followup today of recent urinary tract infection discovered at time of physical exam. Patient was asymptomatic at the time but urinalysis was abnormal.    Review of Systems     Objective:   Physical Exam urinalysis today is normal.        Assessment & Plan:  UTI-resolved  Plan: Return in 6 months for six-month recheck.

## 2011-06-22 NOTE — Patient Instructions (Signed)
Return in 6 months or as needed. 

## 2011-06-26 ENCOUNTER — Encounter: Payer: Medicare Other | Admitting: Internal Medicine

## 2011-07-06 ENCOUNTER — Ambulatory Visit (INDEPENDENT_AMBULATORY_CARE_PROVIDER_SITE_OTHER): Payer: Medicare Other | Admitting: *Deleted

## 2011-07-06 DIAGNOSIS — I4892 Unspecified atrial flutter: Secondary | ICD-10-CM | POA: Diagnosis not present

## 2011-07-06 DIAGNOSIS — Z7901 Long term (current) use of anticoagulants: Secondary | ICD-10-CM

## 2011-07-06 DIAGNOSIS — I4891 Unspecified atrial fibrillation: Secondary | ICD-10-CM

## 2011-07-20 ENCOUNTER — Ambulatory Visit (INDEPENDENT_AMBULATORY_CARE_PROVIDER_SITE_OTHER): Payer: Medicare Other | Admitting: *Deleted

## 2011-07-20 DIAGNOSIS — I4891 Unspecified atrial fibrillation: Secondary | ICD-10-CM | POA: Diagnosis not present

## 2011-07-20 DIAGNOSIS — Z7901 Long term (current) use of anticoagulants: Secondary | ICD-10-CM

## 2011-07-20 DIAGNOSIS — I4892 Unspecified atrial flutter: Secondary | ICD-10-CM | POA: Diagnosis not present

## 2011-07-20 LAB — POCT INR: INR: 2

## 2011-07-25 ENCOUNTER — Other Ambulatory Visit: Payer: Self-pay | Admitting: Internal Medicine

## 2011-08-10 ENCOUNTER — Ambulatory Visit (INDEPENDENT_AMBULATORY_CARE_PROVIDER_SITE_OTHER): Payer: Medicare Other | Admitting: *Deleted

## 2011-08-10 DIAGNOSIS — I4892 Unspecified atrial flutter: Secondary | ICD-10-CM | POA: Diagnosis not present

## 2011-08-10 DIAGNOSIS — Z7901 Long term (current) use of anticoagulants: Secondary | ICD-10-CM

## 2011-08-10 DIAGNOSIS — I4891 Unspecified atrial fibrillation: Secondary | ICD-10-CM | POA: Diagnosis not present

## 2011-08-24 ENCOUNTER — Encounter: Payer: Medicare Other | Admitting: *Deleted

## 2011-08-25 ENCOUNTER — Other Ambulatory Visit: Payer: Self-pay | Admitting: Internal Medicine

## 2011-08-25 ENCOUNTER — Encounter: Payer: Self-pay | Admitting: *Deleted

## 2011-08-31 ENCOUNTER — Encounter: Payer: Self-pay | Admitting: Internal Medicine

## 2011-08-31 ENCOUNTER — Ambulatory Visit (INDEPENDENT_AMBULATORY_CARE_PROVIDER_SITE_OTHER): Payer: Medicare Other | Admitting: *Deleted

## 2011-08-31 DIAGNOSIS — I4891 Unspecified atrial fibrillation: Secondary | ICD-10-CM

## 2011-09-01 LAB — REMOTE PACEMAKER DEVICE
AL IMPEDENCE PM: 568 Ohm
ATRIAL PACING PM: 86.47
BATTERY VOLTAGE: 2.95 V
RV LEAD AMPLITUDE: 6.1747 mv

## 2011-09-06 ENCOUNTER — Ambulatory Visit (INDEPENDENT_AMBULATORY_CARE_PROVIDER_SITE_OTHER): Payer: Medicare Other | Admitting: *Deleted

## 2011-09-06 DIAGNOSIS — Z7901 Long term (current) use of anticoagulants: Secondary | ICD-10-CM | POA: Diagnosis not present

## 2011-09-06 DIAGNOSIS — I4891 Unspecified atrial fibrillation: Secondary | ICD-10-CM | POA: Diagnosis not present

## 2011-09-06 DIAGNOSIS — I4892 Unspecified atrial flutter: Secondary | ICD-10-CM

## 2011-09-14 ENCOUNTER — Other Ambulatory Visit: Payer: Self-pay

## 2011-09-14 MED ORDER — METRONIDAZOLE 1 % EX GEL: 1.0000 "application " | Freq: Every day | CUTANEOUS | Status: DC

## 2011-09-14 MED ORDER — ALPRAZOLAM 0.5 MG PO TABS: 0.5000 mg | ORAL_TABLET | Freq: Three times a day (TID) | ORAL | Status: DC | PRN

## 2011-09-25 ENCOUNTER — Encounter: Payer: Self-pay | Admitting: *Deleted

## 2011-09-25 ENCOUNTER — Other Ambulatory Visit: Payer: Self-pay

## 2011-10-08 ENCOUNTER — Other Ambulatory Visit: Payer: Self-pay | Admitting: Internal Medicine

## 2011-10-09 ENCOUNTER — Ambulatory Visit (INDEPENDENT_AMBULATORY_CARE_PROVIDER_SITE_OTHER): Payer: Medicare Other

## 2011-10-09 ENCOUNTER — Other Ambulatory Visit: Payer: Self-pay | Admitting: Internal Medicine

## 2011-10-09 DIAGNOSIS — I4892 Unspecified atrial flutter: Secondary | ICD-10-CM | POA: Diagnosis not present

## 2011-10-09 DIAGNOSIS — Z7901 Long term (current) use of anticoagulants: Secondary | ICD-10-CM

## 2011-10-09 DIAGNOSIS — I4891 Unspecified atrial fibrillation: Secondary | ICD-10-CM

## 2011-10-09 NOTE — Telephone Encounter (Signed)
Have druggist contact Cardiologist for these refills.

## 2011-10-12 ENCOUNTER — Telehealth: Payer: Self-pay | Admitting: Cardiology

## 2011-10-12 MED ORDER — AMIODARONE HCL 200 MG PO TABS: 200.0000 mg | ORAL_TABLET | Freq: Every day | ORAL | Status: DC

## 2011-10-12 NOTE — Telephone Encounter (Signed)
Please return call to patient at [phone] to discuss medication

## 2011-10-12 NOTE — Telephone Encounter (Signed)
Pt needed a RX for amiodarone

## 2011-10-16 ENCOUNTER — Telehealth: Payer: Self-pay | Admitting: Cardiology

## 2011-10-16 NOTE — Telephone Encounter (Signed)
Patient called stated she woke up at 7:00 am this morning and went to bathroom.Stated she decided it was too early so she went back to bed.States when she layed down she had pain below right arm,pain radiated up into rt chest and throat.States pain lasted appox 1 hour.Stated this is the second time she has had this pain and this morning was the worse.Patient was told Dr.Hochrein not in office today.No appointments available with extenders this week.Patient was told if she has pain again she should go to ER.Message sent to Dr.Hochrein's nurse.

## 2011-10-16 NOTE — Telephone Encounter (Signed)
New msg Pt called about some chest pain that comes and goes over last several weeks Please call to discuss

## 2011-10-17 NOTE — Telephone Encounter (Signed)
PT RTN CALL FROM YESTERDAY, WAS TOLD BY CHERYL, PAM WOULD CALL HER THIS AM, WORRIED THAT SHE'S NOT GOING TO GET A CALL, WANTED TO CALL BACK, [phone]

## 2011-10-17 NOTE — Telephone Encounter (Signed)
Pt complaining of Right arm pain and exteneded into throat.  Resolved on its own - lasted about 1 hour.  She denies any n/v or SOB.  States that if she burped she would have felt better. She will keep appt 8/8 and call back if problems occur before then.

## 2011-11-09 ENCOUNTER — Ambulatory Visit (INDEPENDENT_AMBULATORY_CARE_PROVIDER_SITE_OTHER): Payer: Medicare Other | Admitting: Cardiology

## 2011-11-09 ENCOUNTER — Ambulatory Visit (INDEPENDENT_AMBULATORY_CARE_PROVIDER_SITE_OTHER): Payer: Medicare Other | Admitting: *Deleted

## 2011-11-09 ENCOUNTER — Encounter: Payer: Self-pay | Admitting: Cardiology

## 2011-11-09 VITALS — BP 138/66 | HR 68 | Ht 66.0 in | Wt 157.4 lb

## 2011-11-09 DIAGNOSIS — I4891 Unspecified atrial fibrillation: Secondary | ICD-10-CM

## 2011-11-09 DIAGNOSIS — Z7901 Long term (current) use of anticoagulants: Secondary | ICD-10-CM | POA: Diagnosis not present

## 2011-11-09 DIAGNOSIS — I498 Other specified cardiac arrhythmias: Secondary | ICD-10-CM

## 2011-11-09 DIAGNOSIS — I10 Essential (primary) hypertension: Secondary | ICD-10-CM

## 2011-11-09 DIAGNOSIS — I4892 Unspecified atrial flutter: Secondary | ICD-10-CM

## 2011-11-09 LAB — POCT INR: INR: 2.1

## 2011-11-09 NOTE — Patient Instructions (Addendum)
The current medical regimen is effective;  continue present plan and medications.  Follow up in 1 year with Dr Hochrein.  You will receive a letter in the mail 2 months before you are due.  Please call us when you receive this letter to schedule your follow up appointment.  

## 2011-11-09 NOTE — Progress Notes (Signed)
HPI The patient presents for follow up.  Since I last saw her she has had no new problems.  She gets around slowly due to her advanced age but she's not had the vertigo that bother her and no falls. She doesn't feel any significant palpitations, presyncope or syncope. She's had no chest pain or shortness of breath. She's had no weight gain or edema.  No Known Allergies  Current Outpatient Prescriptions  Medication Sig Dispense Refill  . ALPRAZolam (XANAX) 0.5 MG tablet Take 1 tablet (0.5 mg total) by mouth 3 (three) times daily as needed.  90 tablet  5  . amiodarone (PACERONE) 200 MG tablet Take 1 tablet (200 mg total) by mouth daily. TAKE 1 TAB ON M, T, W, TH, F...Marland KitchenMarland KitchenON SAT AND SUN TAKE ONLY 1/2 TAB  90 tablet  2  . Cholecalciferol (VITAMIN D3) 1000 UNITS CAPS Take 1 capsule by mouth daily.        . cycloSPORINE (RESTASIS) 0.05 % ophthalmic emulsion 1 drop 2 (two) times daily.        Marland Kitchen esomeprazole (NEXIUM) 40 MG capsule Take 1 capsule (40 mg total) by mouth daily before breakfast.  90 capsule  3  . ferrous sulfate 325 (65 FE) MG tablet Take 325 mg by mouth daily with breakfast.        . levothyroxine (SYNTHROID, LEVOTHROID) 88 MCG tablet TAKE 1 TABLET EVERY DAY  90 tablet  1  . metoprolol (LOPRESSOR) 50 MG tablet TAKE 1 TABLET BY MOUTH EVERY DAY  90 tablet  0  . metroNIDAZOLE (METROGEL) 1 % gel Apply 1 application topically daily.  45 g  6  . Multiple Vitamins-Minerals (ICAPS) CAPS Take 2 capsules by mouth daily.       . psyllium (METAMUCIL) 58.6 % powder Take 1 packet by mouth daily.        Marland Kitchen triamterene-hydrochlorothiazide (MAXZIDE-25) 37.5-25 MG per tablet TAKE 1 TABLET BY MOUTH EVERY DAY  90 tablet  0  . warfarin (COUMADIN) 4 MG tablet       . DISCONTD: warfarin (COUMADIN) 4 MG tablet TAKE 1 TABLET EVERY DAY EXCEPT TAKE 2 TABLETS ON FRIDAYS  102 tablet  2    Past Medical History  Diagnosis Date  . Symptomatic sinus bradycardia     s/p pacemaker  . Atrial flutter     s/p RFCA  .  Atrial fibrillation   . Hypothyroidism   . GERD (gastroesophageal reflux disease)   . Esophageal stricture   . Anemia   . HTN (hypertension)     a.  echo 3/06: EF 55-65%, mild LVH, mild RVH  . Asthma   . Tremor, essential   . Diverticulosis   . Vertigo   . Urinary, incontinence, stress female   . DJD (degenerative joint disease)     knees  . Renal insufficiency   . Hearing loss   . Macular degeneration     Past Surgical History  Procedure Date  . Pacemaker insertion   . Radiofrequency catheter ablation     ROS:  As stated in the HPI and negative for all other systems.  PHYSICAL EXAM BP 138/66  Pulse 68  Ht 5\' 6"  (1.676 m)  Wt 157 lb 6.4 oz (71.396 kg)  BMI 25.40 kg/m2 GENERAL:  Well appearing, but frail NECK:  No jugular venous distention, waveform within normal limits, carotid upstroke brisk and symmetric, no bruits, no thyromegaly LUNGS:  Clear to auscultation bilaterally BACK:  No CVA tenderness, lordosis CHEST:  Pacer pocket OK. HEART:  PMI not displaced or sustained,S1 and S2 within normal limits, no S3, no S4, no clicks, no rubs, no murmurs ABD:  Flat, positive bowel sounds normal in frequency in pitch, no bruits, no rebound, no guarding, no midline pulsatile mass, no hepatomegaly, no splenomegaly EXT:  2 plus pulses throughout, mild edema, no cyanosis no clubbing   ASSESSMENT AND PLAN  ATRIAL FIBRILLATION -  She will continue the meds as listed. Her liver profile and TSH were checked earlier this year and were normal. No change in therapy is indicated. She's having her warfarin checked today.   CARDIAC PACEMAKER IN SITU -  She is up to date with follow up.  HYPERTENSION -  The blood pressure is at target. No change in medications is indicated. We will continue with therapeutic lifestyle changes (TLC).

## 2011-11-15 ENCOUNTER — Ambulatory Visit: Payer: Medicare Other | Admitting: Cardiology

## 2011-11-18 ENCOUNTER — Other Ambulatory Visit: Payer: Self-pay | Admitting: Internal Medicine

## 2011-12-05 ENCOUNTER — Other Ambulatory Visit: Payer: Self-pay | Admitting: Internal Medicine

## 2011-12-05 DIAGNOSIS — Z1231 Encounter for screening mammogram for malignant neoplasm of breast: Secondary | ICD-10-CM

## 2011-12-07 ENCOUNTER — Encounter: Payer: Self-pay | Admitting: Internal Medicine

## 2011-12-07 ENCOUNTER — Ambulatory Visit (INDEPENDENT_AMBULATORY_CARE_PROVIDER_SITE_OTHER): Payer: Medicare Other | Admitting: *Deleted

## 2011-12-07 DIAGNOSIS — I4891 Unspecified atrial fibrillation: Secondary | ICD-10-CM | POA: Diagnosis not present

## 2011-12-12 ENCOUNTER — Ambulatory Visit: Payer: Medicare Other | Admitting: Internal Medicine

## 2011-12-12 LAB — REMOTE PACEMAKER DEVICE
AL AMPLITUDE: 2.3 mv
ATRIAL PACING PM: 87.32
BATTERY VOLTAGE: 2.93 V
RV LEAD IMPEDENCE PM: 392 Ohm
VENTRICULAR PACING PM: 3.97

## 2011-12-18 ENCOUNTER — Encounter: Payer: Self-pay | Admitting: Internal Medicine

## 2011-12-18 ENCOUNTER — Ambulatory Visit (INDEPENDENT_AMBULATORY_CARE_PROVIDER_SITE_OTHER): Payer: Medicare Other | Admitting: Internal Medicine

## 2011-12-18 VITALS — BP 120/70 | HR 68 | Temp 97.1°F | Ht 64.0 in | Wt 152.0 lb

## 2011-12-18 DIAGNOSIS — G250 Essential tremor: Secondary | ICD-10-CM | POA: Diagnosis not present

## 2011-12-18 DIAGNOSIS — H8110 Benign paroxysmal vertigo, unspecified ear: Secondary | ICD-10-CM | POA: Diagnosis not present

## 2011-12-18 DIAGNOSIS — J300 Vasomotor rhinitis: Secondary | ICD-10-CM

## 2011-12-18 DIAGNOSIS — G252 Other specified forms of tremor: Secondary | ICD-10-CM | POA: Diagnosis not present

## 2011-12-18 DIAGNOSIS — H3530 Unspecified macular degeneration: Secondary | ICD-10-CM

## 2011-12-18 DIAGNOSIS — I10 Essential (primary) hypertension: Secondary | ICD-10-CM

## 2011-12-18 DIAGNOSIS — F419 Anxiety disorder, unspecified: Secondary | ICD-10-CM

## 2011-12-18 DIAGNOSIS — E039 Hypothyroidism, unspecified: Secondary | ICD-10-CM

## 2011-12-18 DIAGNOSIS — J309 Allergic rhinitis, unspecified: Secondary | ICD-10-CM

## 2011-12-18 DIAGNOSIS — F411 Generalized anxiety disorder: Secondary | ICD-10-CM

## 2011-12-18 DIAGNOSIS — Z23 Encounter for immunization: Secondary | ICD-10-CM | POA: Diagnosis not present

## 2011-12-18 DIAGNOSIS — R040 Epistaxis: Secondary | ICD-10-CM

## 2011-12-18 DIAGNOSIS — H9190 Unspecified hearing loss, unspecified ear: Secondary | ICD-10-CM

## 2011-12-18 DIAGNOSIS — Z8679 Personal history of other diseases of the circulatory system: Secondary | ICD-10-CM

## 2011-12-18 DIAGNOSIS — Z950 Presence of cardiac pacemaker: Secondary | ICD-10-CM

## 2011-12-22 ENCOUNTER — Ambulatory Visit: Payer: Medicare Other

## 2011-12-25 ENCOUNTER — Telehealth: Payer: Self-pay | Admitting: Internal Medicine

## 2011-12-26 NOTE — Telephone Encounter (Signed)
Per MJB, generic will NOT work.  Only Nexium.  Continue on same dosage.  Pt notified.

## 2011-12-27 ENCOUNTER — Ambulatory Visit (INDEPENDENT_AMBULATORY_CARE_PROVIDER_SITE_OTHER): Payer: Medicare Other | Admitting: *Deleted

## 2011-12-27 ENCOUNTER — Encounter: Payer: Self-pay | Admitting: *Deleted

## 2011-12-27 DIAGNOSIS — I4892 Unspecified atrial flutter: Secondary | ICD-10-CM

## 2011-12-27 DIAGNOSIS — I4891 Unspecified atrial fibrillation: Secondary | ICD-10-CM

## 2011-12-27 DIAGNOSIS — Z7901 Long term (current) use of anticoagulants: Secondary | ICD-10-CM

## 2011-12-27 LAB — POCT INR: INR: 1.8

## 2012-01-01 NOTE — Progress Notes (Signed)
  Subjective:    Patient ID: [name], female    DOB: Bierlein 04, 1923, 78 y.o.   MRN: [id number]  HPI 78 year old white female in today for six-month followup on multiple medical problems including hypertension, vertigo, hypothyroidism, anxiety, history of atrial fibrillation, chronic kidney disease, hearing loss both the ears, E. essential tremor, GE reflux with history of stricture, stress urinary incontinence, degenerative joint disease of her knees. She had pacemaker insertion October 2005. Is maintained on Synthroid, chronic amiodarone therapy, uses Restasis for dry eyes, takes Nexium daily, take Xanax at least at bedtime for recurrent benign positional vertigo and anxiety. History of macular degeneration for which she takes Ocuvite. She has a remote history of iron deficiency anemia and takes iron supplement daily. Had Zostavax vaccine January 2012. Gets annual influenza immunization. Had Pneumovax immunization January 2011.  She lives alone and is a widow. Patient complaining of nosebleeds 2-3 times a week. Has chronic rhinorrhea. Blood pressure initially elevated in the office today at 148/74. It was rechecked at 120/70.    Review of Systems     Objective:   Physical Exam slightly boggy nasal mucosa. Ears slight cerumen. Neck is supple without JVD or thyromegaly. No carotid bruits. Chest clear to auscultation. Cardiac exam today is regular. Extremities without edema. She is alert and oriented x3.        Assessment & Plan:  Vasomotor rhinitis causing chronic rhinorrhea  Recent nosebleeds likely due to dry nasal mucosa-recommend Bactroban ointment to nostrils twice daily.  Possible allergic rhinitis. Patient says she had allergy testing as a child. If rhinorrhea continues we can consider allergy consultation. We might could use a steroid nasal spray for vasomotor rhinitis but that might aggravate nosebleeds.  Hypothyroidism on thyroid replacement  therapy.  Hypertension-stable  History of atrial fibrillation  History of benign essential tremor  History of benign positional vertigo  Macular degeneration  Hearing loss in both ears  Long term anticoagulant therapy  Osteoarthritis of knees  Chronic kidney disease  Stress urinary incontinence Cornman try Ditropan 5 mg twice daily  Return in 6 months for physical examination. If nosebleeds persist, she will need ENT referral for possible cauterization.

## 2012-01-04 DIAGNOSIS — L723 Sebaceous cyst: Secondary | ICD-10-CM | POA: Diagnosis not present

## 2012-01-04 DIAGNOSIS — Z85828 Personal history of other malignant neoplasm of skin: Secondary | ICD-10-CM | POA: Diagnosis not present

## 2012-01-20 ENCOUNTER — Other Ambulatory Visit: Payer: Self-pay | Admitting: Internal Medicine

## 2012-01-24 ENCOUNTER — Ambulatory Visit (INDEPENDENT_AMBULATORY_CARE_PROVIDER_SITE_OTHER): Payer: Medicare Other | Admitting: *Deleted

## 2012-01-24 DIAGNOSIS — I4892 Unspecified atrial flutter: Secondary | ICD-10-CM | POA: Diagnosis not present

## 2012-01-24 DIAGNOSIS — I4891 Unspecified atrial fibrillation: Secondary | ICD-10-CM

## 2012-01-24 DIAGNOSIS — Z7901 Long term (current) use of anticoagulants: Secondary | ICD-10-CM

## 2012-01-24 LAB — POCT INR: INR: 1.9

## 2012-02-06 ENCOUNTER — Ambulatory Visit (INDEPENDENT_AMBULATORY_CARE_PROVIDER_SITE_OTHER): Payer: Medicare Other

## 2012-02-06 DIAGNOSIS — I4892 Unspecified atrial flutter: Secondary | ICD-10-CM

## 2012-02-06 DIAGNOSIS — I4891 Unspecified atrial fibrillation: Secondary | ICD-10-CM | POA: Diagnosis not present

## 2012-02-06 DIAGNOSIS — Z85828 Personal history of other malignant neoplasm of skin: Secondary | ICD-10-CM | POA: Diagnosis not present

## 2012-02-06 DIAGNOSIS — L570 Actinic keratosis: Secondary | ICD-10-CM | POA: Diagnosis not present

## 2012-02-06 DIAGNOSIS — D485 Neoplasm of uncertain behavior of skin: Secondary | ICD-10-CM | POA: Diagnosis not present

## 2012-02-06 DIAGNOSIS — Z7901 Long term (current) use of anticoagulants: Secondary | ICD-10-CM | POA: Diagnosis not present

## 2012-02-06 DIAGNOSIS — L723 Sebaceous cyst: Secondary | ICD-10-CM | POA: Diagnosis not present

## 2012-02-15 ENCOUNTER — Other Ambulatory Visit: Payer: Self-pay | Admitting: Internal Medicine

## 2012-03-01 NOTE — Patient Instructions (Addendum)
Continue same medications and return in 6 months. Try Bactroban ointment and nose twice daily to prevent nosebleeds.

## 2012-03-02 ENCOUNTER — Other Ambulatory Visit: Payer: Self-pay | Admitting: Internal Medicine

## 2012-03-04 DIAGNOSIS — I4891 Unspecified atrial fibrillation: Secondary | ICD-10-CM | POA: Diagnosis not present

## 2012-03-04 LAB — PROTIME-INR: INR: 3 — AB (ref 0.9–1.1)

## 2012-03-05 ENCOUNTER — Ambulatory Visit: Payer: Self-pay | Admitting: Cardiovascular Disease

## 2012-03-05 DIAGNOSIS — Z7901 Long term (current) use of anticoagulants: Secondary | ICD-10-CM

## 2012-03-05 DIAGNOSIS — I4891 Unspecified atrial fibrillation: Secondary | ICD-10-CM

## 2012-03-05 DIAGNOSIS — I4892 Unspecified atrial flutter: Secondary | ICD-10-CM

## 2012-03-18 ENCOUNTER — Ambulatory Visit (INDEPENDENT_AMBULATORY_CARE_PROVIDER_SITE_OTHER): Payer: Medicare Other | Admitting: *Deleted

## 2012-03-18 DIAGNOSIS — Z950 Presence of cardiac pacemaker: Secondary | ICD-10-CM | POA: Diagnosis not present

## 2012-03-18 DIAGNOSIS — I498 Other specified cardiac arrhythmias: Secondary | ICD-10-CM | POA: Diagnosis not present

## 2012-03-19 ENCOUNTER — Other Ambulatory Visit: Payer: Self-pay | Admitting: Internal Medicine

## 2012-03-20 ENCOUNTER — Encounter: Payer: Self-pay | Admitting: Internal Medicine

## 2012-03-31 LAB — REMOTE PACEMAKER DEVICE
AL AMPLITUDE: 2.1 mv
BATTERY VOLTAGE: 2.93 V
RV LEAD AMPLITUDE: 5.5 mv

## 2012-04-02 ENCOUNTER — Ambulatory Visit (INDEPENDENT_AMBULATORY_CARE_PROVIDER_SITE_OTHER): Payer: Medicare Other | Admitting: Pharmacist

## 2012-04-02 DIAGNOSIS — Z7901 Long term (current) use of anticoagulants: Secondary | ICD-10-CM

## 2012-04-02 DIAGNOSIS — I4892 Unspecified atrial flutter: Secondary | ICD-10-CM | POA: Diagnosis not present

## 2012-04-02 DIAGNOSIS — I4891 Unspecified atrial fibrillation: Secondary | ICD-10-CM

## 2012-04-05 ENCOUNTER — Encounter: Payer: Self-pay | Admitting: *Deleted

## 2012-04-12 ENCOUNTER — Telehealth: Payer: Self-pay | Admitting: Internal Medicine

## 2012-04-12 ENCOUNTER — Other Ambulatory Visit: Payer: Self-pay

## 2012-04-12 MED ORDER — ESOMEPRAZOLE MAGNESIUM 40 MG PO CPDR: 40.0000 mg | DELAYED_RELEASE_CAPSULE | Freq: Every day | ORAL | Status: DC

## 2012-04-12 NOTE — Telephone Encounter (Signed)
Please call in Rx

## 2012-04-12 NOTE — Telephone Encounter (Signed)
Please refill for one year  

## 2012-04-17 ENCOUNTER — Telehealth: Payer: Self-pay | Admitting: Internal Medicine

## 2012-04-17 MED ORDER — TRIAMTERENE-HCTZ 37.5-25 MG PO TABS: 1.0000 | ORAL_TABLET | Freq: Every day | ORAL | Status: DC

## 2012-04-17 NOTE — Telephone Encounter (Signed)
Rx refill done.  

## 2012-04-30 ENCOUNTER — Ambulatory Visit (INDEPENDENT_AMBULATORY_CARE_PROVIDER_SITE_OTHER): Payer: Medicare Other | Admitting: *Deleted

## 2012-04-30 DIAGNOSIS — I4891 Unspecified atrial fibrillation: Secondary | ICD-10-CM

## 2012-04-30 DIAGNOSIS — I4892 Unspecified atrial flutter: Secondary | ICD-10-CM | POA: Diagnosis not present

## 2012-04-30 DIAGNOSIS — Z7901 Long term (current) use of anticoagulants: Secondary | ICD-10-CM | POA: Diagnosis not present

## 2012-04-30 LAB — POCT INR: INR: 2.3

## 2012-05-08 ENCOUNTER — Ambulatory Visit (INDEPENDENT_AMBULATORY_CARE_PROVIDER_SITE_OTHER): Payer: Medicare Other | Admitting: Internal Medicine

## 2012-05-08 ENCOUNTER — Encounter: Payer: Self-pay | Admitting: Internal Medicine

## 2012-05-08 VITALS — BP 150/62 | HR 69 | Wt 133.1 lb

## 2012-05-08 DIAGNOSIS — I4892 Unspecified atrial flutter: Secondary | ICD-10-CM | POA: Diagnosis not present

## 2012-05-08 DIAGNOSIS — I498 Other specified cardiac arrhythmias: Secondary | ICD-10-CM | POA: Diagnosis not present

## 2012-05-08 DIAGNOSIS — I4891 Unspecified atrial fibrillation: Secondary | ICD-10-CM

## 2012-05-08 DIAGNOSIS — Z950 Presence of cardiac pacemaker: Secondary | ICD-10-CM | POA: Diagnosis not present

## 2012-05-08 DIAGNOSIS — I10 Essential (primary) hypertension: Secondary | ICD-10-CM

## 2012-05-08 LAB — PACEMAKER DEVICE OBSERVATION
AL AMPLITUDE: 2.8 mv
AL IMPEDENCE PM: 688 Ohm
BAMS-0001: 170 {beats}/min
BATTERY VOLTAGE: 2.92 V
RV LEAD AMPLITUDE: 7.2 mv
VENTRICULAR PACING PM: 3.09

## 2012-05-08 NOTE — Addendum Note (Signed)
Addended by: Orby Tangen, [place] N on: 05/08/2012 04:27 PM   Modules accepted: Orders

## 2012-05-08 NOTE — Assessment & Plan Note (Signed)
Her blood pressure is slightly elevated today. I've encouraged the patient to reduce her salt intake. No changes in medications today though we might consider up titration of her blood pressure medications in the future if her blood pressure remains high.

## 2012-05-08 NOTE — Progress Notes (Signed)
HPI [name] returns today for followup. She is a very pleasant 79 year old Silva with a history of symptomatic bradycardia, paroxysmal atrial fibrillation, status post permanent pacemaker insertion. In the interim, she has done well. She denies chest pain, shortness of breath, or syncope. She is very minimal intermittent peripheral edema. She denies palpitations. She is predominantly in normal rhythm on low-dose amiodarone therapy. No Known Allergies   Current Outpatient Prescriptions  Medication Sig Dispense Refill  . ALPRAZolam (XANAX) 0.5 MG tablet Take 1 tablet (0.5 mg total) by mouth 3 (three) times daily as needed.  90 tablet  5  . amiodarone (PACERONE) 200 MG tablet Take 1 tablet (200 mg total) by mouth daily. TAKE 1 TAB ON M, T, W, TH, F...Marland KitchenMarland KitchenON SAT AND SUN TAKE ONLY 1/2 TAB  90 tablet  2  . Cholecalciferol (VITAMIN D3) 1000 UNITS CAPS Take 1 capsule by mouth daily.        . cycloSPORINE (RESTASIS) 0.05 % ophthalmic emulsion 1 drop 2 (two) times daily.        Marland Kitchen esomeprazole (NEXIUM) 40 MG capsule Take 1 capsule (40 mg total) by mouth daily before breakfast.  90 capsule  3  . ferrous sulfate 325 (65 FE) MG tablet Take 325 mg by mouth daily with breakfast.        . levothyroxine (SYNTHROID, LEVOTHROID) 88 MCG tablet TAKE 1 TABLET EVERY DAY  90 tablet  1  . levothyroxine (SYNTHROID, LEVOTHROID) 88 MCG tablet TAKE 1 TABLET EVERY DAY  90 tablet  1  . metoprolol (LOPRESSOR) 50 MG tablet TAKE 1 TABLET EVERY DAY  90 tablet  10  . metroNIDAZOLE (METROGEL) 1 % gel Apply 1 application topically daily.  45 g  6  . Multiple Vitamins-Minerals (ICAPS) CAPS Take 2 capsules by mouth daily.       . psyllium (METAMUCIL) 58.6 % powder Take 1 packet by mouth daily.        Marland Kitchen triamterene-hydrochlorothiazide (MAXZIDE-25) 37.5-25 MG per tablet Take 1 each (1 tablet total) by mouth daily.  90 tablet  1  . warfarin (COUMADIN) 4 MG tablet       . warfarin (COUMADIN) 4 MG tablet TAKE 1 TABLET EVERY DAY EXCEPT TAKE 2  TABLETS ON FRIDAYS  102 tablet  2     Past Medical History  Diagnosis Date  . Symptomatic sinus bradycardia     s/p pacemaker  . Atrial flutter     s/p RFCA  . Atrial fibrillation   . Hypothyroidism   . GERD (gastroesophageal reflux disease)   . Esophageal stricture   . Anemia   . HTN (hypertension)     a.  echo 3/06: EF 55-65%, mild LVH, mild RVH  . Asthma   . Tremor, essential   . Diverticulosis   . Vertigo   . Urinary, incontinence, stress female   . DJD (degenerative joint disease)     knees  . Renal insufficiency   . Hearing loss   . Macular degeneration     ROS:   All systems reviewed and negative except as noted in the HPI.   Past Surgical History  Procedure Date  . Pacemaker insertion   . Radiofrequency catheter ablation      Family History  Problem Relation Age of Onset  . Cancer Mother     breast  . Cancer Father     leukemia  . Cirrhosis Brother     deceased     History   Social History  .  Marital Status: Widowed    Spouse Name: N/A    Number of Children: 2  . Years of Education: N/A   Occupational History  . retired Optometrist    Social History Main Topics  . Smoking status: Former Smoker    Types: Cigarettes    Quit date: 02/04/1962  . Smokeless tobacco: Never Used  . Alcohol Use: Yes     Comment: rarely  . Drug Use: No  . Sexually Active: Not on file   Other Topics Concern  . Not on file   Social History Narrative  . No narrative on file     BP 150/62  Pulse 69  Wt 133 lb 1.9 oz (60.383 kg)  Physical Exam:  Well appearing [name], NAD HEENT: Unremarkable Neck:  No JVD, no thyromegally Lungs:  Clear with no wheezes, rales, or rhonchi. HEART:  Regular rate rhythm, no murmurs, no rubs, no clicks Abd:  soft, positive bowel sounds, no organomegally, no rebound, no guarding Ext:  2 plus pulses, no edema, no cyanosis, no clubbing Skin:  No rashes no nodules Neuro:  CN II through XII intact, motor grossly  intact  EKG normal sinus rhythm with atrial pacing  DEVICE  Normal device function.  See PaceArt for details.   Assess/Plan:

## 2012-05-08 NOTE — Assessment & Plan Note (Signed)
She is maintaining normal sinus rhythm 94% of the time on low-dose amiodarone therapy. I've made no changes to her medications today.

## 2012-05-08 NOTE — Assessment & Plan Note (Signed)
Her Medtronic dual-chamber pacemaker is working normally. We'll plan to recheck in several months. 

## 2012-05-25 ENCOUNTER — Other Ambulatory Visit: Payer: Self-pay | Admitting: Cardiology

## 2012-06-01 ENCOUNTER — Other Ambulatory Visit: Payer: Self-pay | Admitting: Internal Medicine

## 2012-06-03 ENCOUNTER — Other Ambulatory Visit: Payer: Self-pay

## 2012-06-03 ENCOUNTER — Other Ambulatory Visit: Payer: Self-pay | Admitting: Internal Medicine

## 2012-06-03 DIAGNOSIS — F411 Generalized anxiety disorder: Secondary | ICD-10-CM

## 2012-06-06 ENCOUNTER — Ambulatory Visit: Payer: Self-pay | Admitting: Cardiology

## 2012-06-06 DIAGNOSIS — Z7901 Long term (current) use of anticoagulants: Secondary | ICD-10-CM

## 2012-06-06 DIAGNOSIS — I4892 Unspecified atrial flutter: Secondary | ICD-10-CM

## 2012-06-06 DIAGNOSIS — I4891 Unspecified atrial fibrillation: Secondary | ICD-10-CM | POA: Diagnosis not present

## 2012-06-06 LAB — PROTIME-INR: INR: 2.6 — AB (ref 0.9–1.1)

## 2012-06-17 ENCOUNTER — Encounter: Payer: Medicare Other | Admitting: Internal Medicine

## 2012-07-05 ENCOUNTER — Encounter (HOSPITAL_COMMUNITY): Payer: Self-pay | Admitting: *Deleted

## 2012-07-05 ENCOUNTER — Emergency Department (INDEPENDENT_AMBULATORY_CARE_PROVIDER_SITE_OTHER): Payer: Medicare Other

## 2012-07-05 ENCOUNTER — Encounter: Payer: Medicare Other | Admitting: Internal Medicine

## 2012-07-05 ENCOUNTER — Emergency Department (INDEPENDENT_AMBULATORY_CARE_PROVIDER_SITE_OTHER)
Admission: EM | Admit: 2012-07-05 | Discharge: 2012-07-05 | Disposition: A | Discharge status: Home / Self Care | Payer: Federal, State, Local not specified - PPO | Source: Home / Self Care | Attending: Emergency Medicine | Admitting: Emergency Medicine

## 2012-07-05 DIAGNOSIS — J069 Acute upper respiratory infection, unspecified: Secondary | ICD-10-CM

## 2012-07-05 DIAGNOSIS — F411 Generalized anxiety disorder: Secondary | ICD-10-CM | POA: Diagnosis not present

## 2012-07-05 DIAGNOSIS — J449 Chronic obstructive pulmonary disease, unspecified: Secondary | ICD-10-CM | POA: Diagnosis not present

## 2012-07-05 DIAGNOSIS — J019 Acute sinusitis, unspecified: Secondary | ICD-10-CM

## 2012-07-05 LAB — POCT I-STAT, CHEM 8
BUN: 20 mg/dL (ref 6–23)
Chloride: 95 mEq/L — ABNORMAL LOW (ref 96–112)
Creatinine, Ser: 1.7 mg/dL — ABNORMAL HIGH (ref 0.50–1.10)
Sodium: 129 mEq/L — ABNORMAL LOW (ref 135–145)

## 2012-07-05 MED ORDER — ALBUTEROL SULFATE HFA 108 (90 BASE) MCG/ACT IN AERS: 1.0000 | INHALATION_SPRAY | Freq: Four times a day (QID) | RESPIRATORY_TRACT | Status: DC | PRN

## 2012-07-05 MED ORDER — CEPHALEXIN 500 MG PO CAPS: 500.0000 mg | ORAL_CAPSULE | Freq: Three times a day (TID) | ORAL | Status: DC

## 2012-07-05 MED ORDER — FLUTICASONE PROPIONATE 50 MCG/ACT NA SUSP: 2.0000 | Freq: Every day | NASAL | Status: DC

## 2012-07-05 NOTE — ED Provider Notes (Signed)
Chief Complaint:   Chief Complaint  Patient presents with  . Shortness of Breath    History of Present Illness:   [name] is an 79 year old female who has had a five-day history of sore throat, nasal congestion with clear drainage, headache, facial pain, wheezing, and cough productive of clear sputum she denies any fever, chills, earache, difficulty breathing, chest pain, nausea, vomiting, or diarrhea.  Review of Systems:  Other than noted above, the patient denies any of the following symptoms: Systemic:  No fevers, chills, sweats, weight loss or gain, fatigue, or tiredness. Eye:  No redness or discharge. ENT:  No ear pain, drainage, headache, nasal congestion, drainage, sinus pressure, difficulty swallowing, or sore throat. Neck:  No neck pain or swollen glands. Lungs:  No cough, sputum production, hemoptysis, wheezing, chest tightness, shortness of breath or chest pain. GI:  No abdominal pain, nausea, vomiting or diarrhea.  PMFSH:  Past medical history, family history, social history, meds, and allergies were reviewed. She has no known medication allergies. She takes Xanax, amiodarone, Restasis, Nexium, ferrous sulfate, Synthroid, Lopressor, MetroGel, Maxzide, and Coumadin. She has a pacemaker.  Physical Exam:   Vital signs:  BP 125/58  Pulse 67  Temp(Src) 99.8 F (37.7 C) (Oral)  Resp 24  SpO2 92% General:  Alert and oriented.  In no distress.  Skin warm and dry. Eye:  No conjunctival injection or drainage. Lids were normal. ENT:  TMs and canals were normal, without erythema or inflammation.  Nasal mucosa was clear and uncongested, without drainage.  Mucous membranes were moist.  Pharynx was clear with no exudate or drainage.  There were no oral ulcerations or lesions. Neck:  Supple, no adenopathy, tenderness or mass. Lungs:  No respiratory distress.  Lungs were clear to auscultation, without wheezes, rales or rhonchi.  Breath sounds were clear and equal bilaterally.  Heart:   Regular rhythm, without gallops, murmers or rubs. Skin:  Clear, warm, and dry, without rash or lesions.   Labs:   Results for orders placed during the hospital encounter of 07/05/12  POCT I-STAT, CHEM 8      Result Value Range   Sodium 129 (*) 135 - 145 mEq/L   Potassium 4.1  3.5 - 5.1 mEq/L   Chloride 95 (*) 96 - 112 mEq/L   BUN 20  6 - 23 mg/dL   Creatinine, Ser 3.08 (*) 0.50 - 1.10 mg/dL   Glucose, Bld 94  70 - 99 mg/dL   Calcium, Ion 6.57  8.46 - 1.30 mmol/L   TCO2 27  0 - 100 mmol/L   Hemoglobin 11.6 (*) 12.0 - 15.0 g/dL   HCT 96.2 (*) 95.2 - 84.1 %     Radiology:  Dg Chest 2 View  07/05/2012  *RADIOLOGY REPORT*  Clinical Data: Cough.  CHEST - 2 VIEW  Comparison: 09/15/2010  Findings: Stable COPD.  Stable appearance of cardiac pacemaker.  No edema, infiltrate, nodule or pleural fluid is identified.  Heart size is within normal limits.  Bony thorax shows stable osteopenia and degenerative changes of the thoracic spine.  IMPRESSION: Stable COPD.  No acute findings.   Original Report Authenticated By: Irish Lack, M.D.     Date: 07/05/2012  Rate: 63  Rhythm: normal sinus rhythm  QRS Axis: normal  Intervals: PR prolonged  ST/T Wave abnormalities: normal  Conduction Disutrbances:first-degree A-V block   Narrative Interpretation: Sinus rhythm with first degree AV block, remote septal infarct with QS waves in leads V1 and V2, otherwise normal.  Old EKG Reviewed: none available  Assessment:  The primary encounter diagnosis was Viral upper respiratory infection. A diagnosis of Acute sinusitis was also pertinent to this visit.  Appears to have a viral upper respiratory infection with secondary sinusitis. We'll treat with cephalexin since she is on Coumadin, albuterol inhaler, and Flonase.  Plan:   1.  The following meds were prescribed:   Discharge Medication List as of 07/05/2012  3:55 PM    START taking these medications   Details  albuterol (PROVENTIL HFA;VENTOLIN HFA) 108  (90 BASE) MCG/ACT inhaler Inhale 1-2 puffs into the lungs every 6 (six) hours as needed for wheezing., Starting 07/05/2012, Until Discontinued, Normal    cephALEXin (KEFLEX) 500 MG capsule Take 1 capsule (500 mg total) by mouth 3 (three) times daily., Starting 07/05/2012, Until Discontinued, Normal    fluticasone (FLONASE) 50 MCG/ACT nasal spray Place 2 sprays into the nose daily., Starting 07/05/2012, Until Discontinued, Normal       2.  The patient was instructed in symptomatic care and handouts were given. 3.  The patient was told to return if becoming worse in any way, if no better in 3 or 4 days, and given some red flag symptoms such as fever, chest pain, or difficulty breathing that would indicate earlier return.      Reuben Likes, MD 07/05/12 2019

## 2012-07-05 NOTE — ED Notes (Signed)
Pt  Reports    Headache    Cough     Congested         Shortness  Of  Breath         Nasal  stuffyness            X  4  Days   Symptoms  Started  Off  As  sorethroat    Getting  Worse               Pt  Lives  Alone  Family  Brought  Her to  Clinic          She  Is  Awake  And  Alert        Oriented

## 2012-07-10 ENCOUNTER — Telehealth: Payer: Self-pay | Admitting: Internal Medicine

## 2012-07-10 NOTE — Telephone Encounter (Signed)
Please refill for one year  

## 2012-07-11 ENCOUNTER — Other Ambulatory Visit: Payer: Self-pay

## 2012-07-11 MED ORDER — ESOMEPRAZOLE MAGNESIUM 40 MG PO CPDR: 40.0000 mg | DELAYED_RELEASE_CAPSULE | Freq: Every day | ORAL | Status: DC

## 2012-07-15 ENCOUNTER — Other Ambulatory Visit: Payer: Self-pay

## 2012-07-18 ENCOUNTER — Ambulatory Visit (INDEPENDENT_AMBULATORY_CARE_PROVIDER_SITE_OTHER): Payer: Medicare Other | Admitting: Pharmacist

## 2012-07-18 DIAGNOSIS — Z7901 Long term (current) use of anticoagulants: Secondary | ICD-10-CM

## 2012-07-18 DIAGNOSIS — I4892 Unspecified atrial flutter: Secondary | ICD-10-CM

## 2012-07-18 DIAGNOSIS — I4891 Unspecified atrial fibrillation: Secondary | ICD-10-CM | POA: Diagnosis not present

## 2012-07-18 LAB — POCT INR: INR: 1.9

## 2012-07-22 ENCOUNTER — Other Ambulatory Visit: Payer: Self-pay | Admitting: Internal Medicine

## 2012-07-29 ENCOUNTER — Other Ambulatory Visit: Payer: Self-pay

## 2012-07-29 ENCOUNTER — Ambulatory Visit (INDEPENDENT_AMBULATORY_CARE_PROVIDER_SITE_OTHER): Payer: Medicare Other | Admitting: Internal Medicine

## 2012-07-29 ENCOUNTER — Encounter: Payer: Self-pay | Admitting: Internal Medicine

## 2012-07-29 VITALS — BP 180/82 | HR 72 | Temp 97.8°F | Wt 149.0 lb

## 2012-07-29 DIAGNOSIS — H6120 Impacted cerumen, unspecified ear: Secondary | ICD-10-CM

## 2012-07-29 DIAGNOSIS — H6123 Impacted cerumen, bilateral: Secondary | ICD-10-CM

## 2012-07-29 NOTE — Patient Instructions (Addendum)
Call if symptoms persist. He Jasso need to see ENT physician to have cerumen removed from right external ear canal and further evaluation of smell and taste

## 2012-07-29 NOTE — Progress Notes (Signed)
  Subjective:    Patient ID: [name], female    DOB: September 02, 1921, 79 y.o.   MRN: [id number]  HPI Patient complaining of trouble hearing. Says sounds are muffled bilaterally. She was seen at urgent care April 4 for respiratory infection treated with Keflex, generic Flonase and albuterol inhaler. Says she cannot smell or taste anything. Thinks respiratory infection did not clear up. Patient has history of cerumen impaction and hearing loss. History of benign positional vertigo and anxiety. Patient is on Coumadin for atrial dysrhythmia.  Review of Systems     Objective:   Physical Exam pharynx is clear. Neck is supple. Chest clear. TMs show impacted cerumen bilaterally. Attempts were made to remove impacted cerumen bilaterally with curette and irrigation. Left ear canal was cleared entirely. Right ear canal was only partially cleared. Some bleeding ensued in both ear canals which was controlled. Cortisporin Otic suspension was applied to both external ear canals after this percentage her. During the procedure, she had bleeding from right nostril which was very minimal. Says this happens from time to time and has protime checked through cardiology office.        Assessment & Plan:  Bilateral impacted cerumen  History of allergic rhinitis  Coumadin therapy  Plan: Patient is asking if some sort of nasal spray would help her regain taste or smell.  I do not think this will help at this point in time. She Dineen need to have cerumen removed from right external ear canal by ENT physician.

## 2012-08-05 ENCOUNTER — Ambulatory Visit (INDEPENDENT_AMBULATORY_CARE_PROVIDER_SITE_OTHER): Payer: Medicare Other | Admitting: *Deleted

## 2012-08-05 ENCOUNTER — Encounter: Payer: Self-pay | Admitting: Internal Medicine

## 2012-08-05 DIAGNOSIS — Z950 Presence of cardiac pacemaker: Secondary | ICD-10-CM | POA: Diagnosis not present

## 2012-08-05 DIAGNOSIS — I498 Other specified cardiac arrhythmias: Secondary | ICD-10-CM | POA: Diagnosis not present

## 2012-08-06 DIAGNOSIS — M1710 Unilateral primary osteoarthritis, unspecified knee: Secondary | ICD-10-CM | POA: Diagnosis not present

## 2012-08-13 ENCOUNTER — Other Ambulatory Visit: Payer: Self-pay | Admitting: Internal Medicine

## 2012-08-13 DIAGNOSIS — H6120 Impacted cerumen, unspecified ear: Secondary | ICD-10-CM | POA: Diagnosis not present

## 2012-08-13 DIAGNOSIS — H9110 Presbycusis, unspecified ear: Secondary | ICD-10-CM | POA: Diagnosis not present

## 2012-08-15 ENCOUNTER — Ambulatory Visit (INDEPENDENT_AMBULATORY_CARE_PROVIDER_SITE_OTHER): Payer: Medicare Other | Admitting: *Deleted

## 2012-08-15 DIAGNOSIS — Z7901 Long term (current) use of anticoagulants: Secondary | ICD-10-CM | POA: Diagnosis not present

## 2012-08-15 DIAGNOSIS — I4891 Unspecified atrial fibrillation: Secondary | ICD-10-CM

## 2012-08-15 DIAGNOSIS — I4892 Unspecified atrial flutter: Secondary | ICD-10-CM | POA: Diagnosis not present

## 2012-08-15 LAB — POCT INR: INR: 2.8

## 2012-08-19 LAB — REMOTE PACEMAKER DEVICE
AL AMPLITUDE: 2.2 mv
BAMS-0001: 170 {beats}/min
RV LEAD AMPLITUDE: 5.1 mv
RV LEAD IMPEDENCE PM: 376 Ohm

## 2012-08-21 ENCOUNTER — Other Ambulatory Visit: Payer: Self-pay | Admitting: Cardiology

## 2012-08-22 ENCOUNTER — Encounter: Payer: Self-pay | Admitting: *Deleted

## 2012-08-30 ENCOUNTER — Ambulatory Visit (INDEPENDENT_AMBULATORY_CARE_PROVIDER_SITE_OTHER): Payer: Medicare Other | Admitting: Internal Medicine

## 2012-08-30 ENCOUNTER — Encounter: Payer: Self-pay | Admitting: Internal Medicine

## 2012-08-30 VITALS — BP 140/76 | HR 76 | Temp 97.9°F | Ht 63.75 in | Wt 146.0 lb

## 2012-08-30 DIAGNOSIS — Z87898 Personal history of other specified conditions: Secondary | ICD-10-CM

## 2012-08-30 DIAGNOSIS — Z79899 Other long term (current) drug therapy: Secondary | ICD-10-CM

## 2012-08-30 DIAGNOSIS — Z8669 Personal history of other diseases of the nervous system and sense organs: Secondary | ICD-10-CM

## 2012-08-30 DIAGNOSIS — Z950 Presence of cardiac pacemaker: Secondary | ICD-10-CM

## 2012-08-30 DIAGNOSIS — Z8679 Personal history of other diseases of the circulatory system: Secondary | ICD-10-CM

## 2012-08-30 DIAGNOSIS — Z23 Encounter for immunization: Secondary | ICD-10-CM

## 2012-08-30 DIAGNOSIS — Z13228 Encounter for screening for other metabolic disorders: Secondary | ICD-10-CM | POA: Diagnosis not present

## 2012-08-30 DIAGNOSIS — G250 Essential tremor: Secondary | ICD-10-CM

## 2012-08-30 DIAGNOSIS — F411 Generalized anxiety disorder: Secondary | ICD-10-CM

## 2012-08-30 DIAGNOSIS — M810 Age-related osteoporosis without current pathological fracture: Secondary | ICD-10-CM | POA: Diagnosis not present

## 2012-08-30 DIAGNOSIS — K219 Gastro-esophageal reflux disease without esophagitis: Secondary | ICD-10-CM

## 2012-08-30 DIAGNOSIS — I10 Essential (primary) hypertension: Secondary | ICD-10-CM

## 2012-08-30 DIAGNOSIS — Z130 Encounter for screening for diseases of the blood and blood-forming organs and certain disorders involving the immune mechanism: Secondary | ICD-10-CM | POA: Diagnosis not present

## 2012-08-30 DIAGNOSIS — E039 Hypothyroidism, unspecified: Secondary | ICD-10-CM

## 2012-08-30 DIAGNOSIS — N189 Chronic kidney disease, unspecified: Secondary | ICD-10-CM

## 2012-08-30 DIAGNOSIS — Z1329 Encounter for screening for other suspected endocrine disorder: Secondary | ICD-10-CM

## 2012-08-30 LAB — COMPREHENSIVE METABOLIC PANEL
ALT: 10 U/L (ref 0–35)
AST: 13 U/L (ref 0–37)
Calcium: 8.9 mg/dL (ref 8.4–10.5)
Chloride: 98 mEq/L (ref 96–112)
Creat: 1.71 mg/dL — ABNORMAL HIGH (ref 0.50–1.10)
Sodium: 134 mEq/L — ABNORMAL LOW (ref 135–145)

## 2012-08-30 LAB — CBC WITH DIFFERENTIAL/PLATELET
Basophils Absolute: 0.1 10*3/uL (ref 0.0–0.1)
Eosinophils Relative: 12 % — ABNORMAL HIGH (ref 0–5)
Lymphocytes Relative: 25 % (ref 12–46)
Neutro Abs: 3.2 10*3/uL (ref 1.7–7.7)
Neutrophils Relative %: 50 % (ref 43–77)
Platelets: 257 10*3/uL (ref 150–400)
RDW: 14.8 % (ref 11.5–15.5)
WBC: 6.3 10*3/uL (ref 4.0–10.5)

## 2012-08-30 LAB — LIPID PANEL
Total CHOL/HDL Ratio: 3 Ratio
VLDL: 28 mg/dL (ref 0–40)

## 2012-08-30 LAB — TSH: TSH: 1.102 u[IU]/mL (ref 0.350–4.500)

## 2012-08-30 LAB — POCT URINALYSIS DIPSTICK
Blood, UA: NEGATIVE
Glucose, UA: NEGATIVE
Nitrite, UA: NEGATIVE
Urobilinogen, UA: NEGATIVE
pH, UA: 6

## 2012-08-30 MED ORDER — TETANUS-DIPHTH-ACELL PERTUSSIS 5-2.5-18.5 LF-MCG/0.5 IM SUSP: 0.5000 mL | Freq: Once | INTRAMUSCULAR | Status: DC

## 2012-08-30 NOTE — Patient Instructions (Addendum)
No change in medications. Return in 6 months for office visit and TSH. Continue cardiology followup. Labs are pending.

## 2012-08-30 NOTE — Progress Notes (Signed)
Subjective:    Patient ID: [name], female    DOB: 11-14-1921, 79 y.o.   MRN: [id number]  HPI  79 year old White female for health maintenance and evaluation of medical problems. Has a pacemaker recently checked by cardiologist. Hx Sick Sinus Syndrome requiring pacer in 2005. Hx of paroxysmal A-fib requiring chronic Coumadin therapy. She has an essential tremor and GE reflux causing esophageal stricture which was dilated in 1996. History of benign positional vertigo, stress urinary incontinence, osteoarthritis, chronic kidney disease, hearing loss both the ears, anemia, history of Schatzki's ring 1992, history of anxiety. No recent bouts of vertigo.  Had carotid Dopplers in 2003 that showed no mass CA stenosis. Is followed by Dr. Sharrell Ku and Dr. Antoine Poche, cardiologist. Long-standing history of hypothyroidism. This occurred well before she was placed on amiodarone.  Had colonoscopy in 2001 at will not repeat because of age. Cataract extraction right eye 1992, left eye in 1993. History of right middle lobe pneumonia 1996.  Social history: Continues to live alone. She is a widow. Formerly smoked a pack of cigarettes daily for some for 5 years but quit in the 1960s. Has a daughter who resides in [place]. Son resides in [place]. She was widowed in the early 1990s.  Patient is a retired Garment/textile technologist. She has maintained good physical conditioning.  Family history: Father died at 65 of acute leukemia. Mother died at age 80 of kidney failure. One brother died at age 67 of cirrhosis of the liver. One sister.  Additional history: Patient's tremor started in the early 1990s. It was present when I started seeing her in 1993.      Review of Systems  Constitutional:       Tires easily  HENT:       Bilateral hearing aids one needs to be replaced  Eyes:       Macular degeneration  Respiratory: Negative for cough and choking.        No cough  Cardiovascular:       No chest  pain  Gastrointestinal: Negative.        Reflux stable  Genitourinary:       Frequency  Allergic/Immunologic: Positive for environmental allergies.  Neurological:       No recent vertigo  Psychiatric/Behavioral:       History of anxiety       Objective:   Physical Exam  Vitals reviewed. Constitutional: She is oriented to person, place, and time. She appears well-developed and well-nourished. No distress.  HENT:  Head: Normocephalic and atraumatic.  Right Ear: External ear normal.  Left Ear: External ear normal.  Mouth/Throat: Oropharynx is clear and moist.  Eyes: Conjunctivae and EOM are normal. Pupils are equal, round, and reactive to light. Right eye exhibits no discharge. Left eye exhibits no discharge. No scleral icterus.  Neck: Neck supple. No JVD present. No thyromegaly present.  Cardiovascular: Normal rate, regular rhythm, normal heart sounds and intact distal pulses.   No murmur heard. Pacer in place nontender  Pulmonary/Chest: Effort normal and breath sounds normal. She has no wheezes. She has no rales.  Breasts normal female  Abdominal: Soft. Bowel sounds are normal. She exhibits no distension and no mass. There is no tenderness. There is no rebound and no guarding.  Genitourinary:  Deferred due to age  Musculoskeletal: Normal range of motion. She exhibits no edema.  Lymphadenopathy:    She has no cervical adenopathy.  Neurological: She is alert and oriented to person, place,  and time. She has normal reflexes. No cranial nerve deficit. Coordination normal.  Skin: Skin is warm and dry. No rash noted. She is not diaphoretic.  Psychiatric: She has a normal mood and affect. Her behavior is normal. Judgment and thought content normal.          Assessment & Plan:  History of paroxysmal atrial fibrillation on chronic anticoagulation therapy  History of pacemaker for sick sinus syndrome followed by  [facility] cardiology  History of essential tremor dating back to the  early 1990s-no current treatment  History of GE reflux with stricture dilated 1996-stable on Nexium  Chronic kidney disease-stable  Hearing loss both the ears-needs one hearing aid replaced  History of anemia-stable  Osteoarthritis of the knees-no worse  History of stress urinary incontinence  History of benign positional vertigo-no recent episodes  History of anxiety-stable with Xanax  Hypothyroidism-treated with thyroid replacement  Hypertension-stable on current medications  Plan: Patient will continue same medications and return in 6 months. Fasting labs drawn and are pending today. She declines mammogram, Pap smear, and will not have repeat colonoscopy at this time due to age.   Subjective:   Patient presents for Medicare Annual/Subsequent preventive examination.   Review Past Medical/Family/Social: No changes   Risk Factors  Current exercise habits: sedentary has knee issues but does go get mail daily Dietary issues discussed: low fat low carb appetite good  Cardiac risk factors: See above  Depression Screen  (Note: if answer to either of the following is "Yes", a more complete depression screening is indicated)   Over the past two weeks, have you felt down, depressed or hopeless? No  Over the past two weeks, have you felt little interest or pleasure in doing things? No Have you lost interest or pleasure in daily life? No Do you often feel hopeless? No Do you cry easily over simple problems? No   Activities of Daily Living  In your present state of health, do you have any difficulty performing the following activities?:   Driving? Does not drive due to macular degeneration  Managing money? No  does has help from daughter  Feeding yourself? No  Getting from bed to chair? No  Climbing a flight of stairs? No  Preparing food and eating?: No  Bathing or showering? No  Getting dressed: No  Getting to the toilet? No  Using the toilet:No  Moving around from  place to place: No  In the past year have you fallen or had a near fall?:No  Are you sexually active? No  Do you have more than one partner? No   Hearing Difficulties: No  Do you often ask people to speak up or repeat themselves? Yes- has bilateral hearing aids Do you experience ringing or noises in your ears? No  Do you have difficulty understanding soft or whispered voices? yes Do you feel that you have a problem with memory? Better some days than others Do you often misplace items?  sometimes   Home Safety:  Do you have a smoke alarm at your residence? Yes Do you have grab bars in the bathroom? yes Do you have throw rugs in your house? no   Cognitive Testing  Alert? Yes Normal Appearance?Yes  Oriented to person? Yes Place? Yes  Time? Yes  Recall of three objects? Yes  Can perform simple calculations? Yes  Displays appropriate judgment?Yes  Can read the correct time from a watch face?Yes   List the Names of Other Physician/Practitioners you currently use:  See referral list for the physicians patient is currently seeing. Dr. Antoine Poche. Dr. Sharrell Ku, Dr. Hazle Quant    Review of Systems: see above   Objective:     General appearance: Appears stated age . Head: Normocephalic, without obvious abnormality, atraumatic  Eyes: conj clear, EOMi PEERLA  Ears: normal TM's and external ear canals both ears  Nose: Nares normal. Septum midline. Mucosa normal. No drainage or sinus tenderness.  Throat: lips, mucosa, and tongue normal; teeth and gums normal  Neck: no adenopathy, no carotid bruit, no JVD, supple, symmetrical, trachea midline and thyroid not enlarged, symmetric, no tenderness/mass/nodules  No CVA tenderness.  Lungs: clear to auscultation bilaterally  Breasts: normal appearance, no masses or tenderness. Heart: regular rate and rhythm, S1, S2 normal, no murmur, click, rub or gallop  Abdomen: soft, non-tender; bowel sounds normal; no masses, no organomegaly   Musculoskeletal: ROM normal in all joints, no crepitus, no deformity, Normal muscle strengthen. Back  is symmetric, no curvature. Skin: Skin color, texture, turgor normal. No rashes or lesions  Lymph nodes: Cervical, supraclavicular, and axillary nodes normal.  Neurologic: CN 2 -12 Normal, Normal symmetric reflexes. Normal coordination and gait  Psych: Alert & Oriented x 3, Mood appear stable.    Assessment:    Annual wellness medicare exam   Plan:    During the course of the visit the patient was educated and counseled about appropriate screening and preventive services including:   Declines mammogram and Pap due to age Has living will Recently had cerumen removal by ENT     Patient Instructions (the written plan) was given to the patient.  Medicare Attestation  I have personally reviewed:  The patient's medical and social history  Their use of alcohol, tobacco or illicit drugs  Their current medications and supplements  The patient's functional ability including ADLs,fall risks, home safety risks, cognitive, and hearing and visual impairment  Diet and physical activities  Evidence for depression or mood disorders  The patient's weight, height, BMI, and visual acuity have been recorded in the chart. I have made referrals, counseling, and provided education to the patient based on review of the above and I have provided the patient with a written personalized care plan for preventive services.

## 2012-08-31 LAB — VITAMIN D 25 HYDROXY (VIT D DEFICIENCY, FRACTURES): Vit D, 25-Hydroxy: 52 ng/mL (ref 30–89)

## 2012-09-09 ENCOUNTER — Ambulatory Visit (INDEPENDENT_AMBULATORY_CARE_PROVIDER_SITE_OTHER): Payer: Medicare Other | Admitting: *Deleted

## 2012-09-09 DIAGNOSIS — I495 Sick sinus syndrome: Secondary | ICD-10-CM

## 2012-09-11 ENCOUNTER — Encounter: Payer: Self-pay | Admitting: Internal Medicine

## 2012-09-12 ENCOUNTER — Ambulatory Visit (INDEPENDENT_AMBULATORY_CARE_PROVIDER_SITE_OTHER): Payer: Medicare Other | Admitting: Pharmacist

## 2012-09-12 DIAGNOSIS — Z7901 Long term (current) use of anticoagulants: Secondary | ICD-10-CM

## 2012-09-12 DIAGNOSIS — I4891 Unspecified atrial fibrillation: Secondary | ICD-10-CM | POA: Diagnosis not present

## 2012-09-12 DIAGNOSIS — I4892 Unspecified atrial flutter: Secondary | ICD-10-CM | POA: Diagnosis not present

## 2012-09-12 LAB — REMOTE PACEMAKER DEVICE
AL IMPEDENCE PM: 560 Ohm
ATRIAL PACING PM: 83.32
BATTERY VOLTAGE: 2.89 V
VENTRICULAR PACING PM: 2.44

## 2012-09-13 DIAGNOSIS — M1710 Unilateral primary osteoarthritis, unspecified knee: Secondary | ICD-10-CM | POA: Diagnosis not present

## 2012-09-16 NOTE — Addendum Note (Signed)
Addended by: Gypsy Balsam K on: 09/16/2012 01:23 PM   Modules accepted: Level of Service

## 2012-09-20 DIAGNOSIS — M1710 Unilateral primary osteoarthritis, unspecified knee: Secondary | ICD-10-CM | POA: Diagnosis not present

## 2012-09-26 DIAGNOSIS — M1710 Unilateral primary osteoarthritis, unspecified knee: Secondary | ICD-10-CM | POA: Diagnosis not present

## 2012-10-08 ENCOUNTER — Ambulatory Visit (INDEPENDENT_AMBULATORY_CARE_PROVIDER_SITE_OTHER): Payer: Medicare Other | Admitting: *Deleted

## 2012-10-08 DIAGNOSIS — I495 Sick sinus syndrome: Secondary | ICD-10-CM

## 2012-10-16 LAB — REMOTE PACEMAKER DEVICE
ATRIAL PACING PM: 86.34
VENTRICULAR PACING PM: 4.25

## 2012-10-18 ENCOUNTER — Encounter: Payer: Self-pay | Admitting: *Deleted

## 2012-10-20 NOTE — Addendum Note (Signed)
Addended by: Marily Lente on: 10/20/2012 08:23 PM   Modules accepted: Level of Service

## 2012-10-24 ENCOUNTER — Ambulatory Visit (INDEPENDENT_AMBULATORY_CARE_PROVIDER_SITE_OTHER): Payer: Medicare Other | Admitting: *Deleted

## 2012-10-24 DIAGNOSIS — I4892 Unspecified atrial flutter: Secondary | ICD-10-CM | POA: Diagnosis not present

## 2012-10-24 DIAGNOSIS — Z7901 Long term (current) use of anticoagulants: Secondary | ICD-10-CM | POA: Diagnosis not present

## 2012-10-24 DIAGNOSIS — I4891 Unspecified atrial fibrillation: Secondary | ICD-10-CM | POA: Diagnosis not present

## 2012-10-24 LAB — POCT INR: INR: 2.2

## 2012-10-28 ENCOUNTER — Telehealth: Payer: Self-pay | Admitting: Internal Medicine

## 2012-10-28 NOTE — Telephone Encounter (Signed)
Please refill x one year 

## 2012-10-29 ENCOUNTER — Telehealth: Payer: Self-pay | Admitting: *Deleted

## 2012-10-29 NOTE — Telephone Encounter (Signed)
Pt called in reference to missed transmission letter.  A successful transmission was sent on 10/25/12. Battery at 2.90V (ERI 2.81V), next remote 11/11/12. Estella Husk

## 2012-11-11 ENCOUNTER — Ambulatory Visit (INDEPENDENT_AMBULATORY_CARE_PROVIDER_SITE_OTHER): Payer: Medicare Other | Admitting: *Deleted

## 2012-11-11 ENCOUNTER — Encounter: Payer: Self-pay | Admitting: Internal Medicine

## 2012-11-11 DIAGNOSIS — I498 Other specified cardiac arrhythmias: Secondary | ICD-10-CM

## 2012-11-11 DIAGNOSIS — Z950 Presence of cardiac pacemaker: Secondary | ICD-10-CM | POA: Diagnosis not present

## 2012-11-12 ENCOUNTER — Ambulatory Visit (INDEPENDENT_AMBULATORY_CARE_PROVIDER_SITE_OTHER): Payer: Medicare Other | Admitting: Internal Medicine

## 2012-11-12 ENCOUNTER — Ambulatory Visit
Admission: RE | Admit: 2012-11-12 | Discharge: 2012-11-12 | Disposition: A | Discharge status: Home / Self Care | Payer: Medicare Other | Source: Ambulatory Visit | Attending: Internal Medicine | Admitting: Internal Medicine

## 2012-11-12 ENCOUNTER — Encounter: Payer: Self-pay | Admitting: Internal Medicine

## 2012-11-12 VITALS — BP 158/84 | HR 72 | Temp 97.1°F | Wt 148.0 lb

## 2012-11-12 DIAGNOSIS — Z79899 Other long term (current) drug therapy: Secondary | ICD-10-CM

## 2012-11-12 DIAGNOSIS — I4891 Unspecified atrial fibrillation: Secondary | ICD-10-CM | POA: Diagnosis not present

## 2012-11-12 DIAGNOSIS — R5382 Chronic fatigue, unspecified: Secondary | ICD-10-CM | POA: Diagnosis not present

## 2012-11-12 DIAGNOSIS — M1710 Unilateral primary osteoarthritis, unspecified knee: Secondary | ICD-10-CM | POA: Diagnosis not present

## 2012-11-12 DIAGNOSIS — I480 Paroxysmal atrial fibrillation: Secondary | ICD-10-CM

## 2012-11-12 DIAGNOSIS — R531 Weakness: Secondary | ICD-10-CM

## 2012-11-12 DIAGNOSIS — R5381 Other malaise: Secondary | ICD-10-CM

## 2012-11-12 DIAGNOSIS — H8110 Benign paroxysmal vertigo, unspecified ear: Secondary | ICD-10-CM | POA: Diagnosis not present

## 2012-11-12 DIAGNOSIS — R0602 Shortness of breath: Secondary | ICD-10-CM

## 2012-11-12 DIAGNOSIS — J438 Other emphysema: Secondary | ICD-10-CM | POA: Diagnosis not present

## 2012-11-12 DIAGNOSIS — R5383 Other fatigue: Secondary | ICD-10-CM | POA: Diagnosis not present

## 2012-11-12 LAB — POCT URINALYSIS DIPSTICK
Bilirubin, UA: NEGATIVE
Glucose, UA: NEGATIVE
Leukocytes, UA: NEGATIVE
Nitrite, UA: NEGATIVE
Urobilinogen, UA: NEGATIVE
pH, UA: 7

## 2012-11-12 LAB — CBC WITH DIFFERENTIAL/PLATELET
Basophils Absolute: 0.1 10*3/uL (ref 0.0–0.1)
Basophils Relative: 2 % — ABNORMAL HIGH (ref 0–1)
Hemoglobin: 12 g/dL (ref 12.0–15.0)
MCHC: 34.3 g/dL (ref 30.0–36.0)
Monocytes Relative: 12 % (ref 3–12)
Neutro Abs: 3.9 10*3/uL (ref 1.7–7.7)
Neutrophils Relative %: 50 % (ref 43–77)
Platelets: 253 10*3/uL (ref 150–400)
RBC: 3.79 MIL/uL — ABNORMAL LOW (ref 3.87–5.11)

## 2012-11-12 MED ORDER — FLUTICASONE-SALMETEROL 250-50 MCG/DOSE IN AEPB: 1.0000 | INHALATION_SPRAY | Freq: Two times a day (BID) | RESPIRATORY_TRACT | Status: DC

## 2012-11-12 NOTE — Progress Notes (Signed)
Left message on answering machine. 

## 2012-11-13 LAB — TSH: TSH: 1.478 u[IU]/mL (ref 0.350–4.500)

## 2012-11-13 LAB — BASIC METABOLIC PANEL
BUN: 30 mg/dL — ABNORMAL HIGH (ref 6–23)
Calcium: 9.5 mg/dL (ref 8.4–10.5)
Chloride: 96 mEq/L (ref 96–112)
Creat: 1.83 mg/dL — ABNORMAL HIGH (ref 0.50–1.10)

## 2012-11-13 LAB — T4, FREE: Free T4: 1.67 ng/dL (ref 0.80–1.80)

## 2012-11-15 ENCOUNTER — Telehealth: Payer: Self-pay | Admitting: Cardiology

## 2012-11-15 ENCOUNTER — Encounter: Payer: Self-pay | Admitting: Cardiology

## 2012-11-15 ENCOUNTER — Ambulatory Visit (INDEPENDENT_AMBULATORY_CARE_PROVIDER_SITE_OTHER): Payer: Medicare Other | Admitting: Cardiology

## 2012-11-15 VITALS — BP 126/68 | HR 66 | Ht 65.5 in | Wt 147.8 lb

## 2012-11-15 DIAGNOSIS — I4891 Unspecified atrial fibrillation: Secondary | ICD-10-CM | POA: Diagnosis not present

## 2012-11-15 NOTE — Progress Notes (Signed)
HPI The patient presents for follow up.  Since I last saw her she has had some increasing dyspnea with activity. Some days are better than others. He feels fatigued more than she was. She might get some episodes of hot flashes and trembling. She has occasionally needed to sit down or put her head down until this passes. She's not had any frank syncope or presyncope. She did have labs done a couple of days ago and I reviewed these. She is not anemic and a TSH was normal. She's had no recent falls though she has fallen in the past. She is in atrial fibrillation today but she hasn't in particular felt any tachycardia palpitations, presyncope or syncope. She's had no PND or orthopnea. She's had no chest pressure, neck or arm discomfort.  No Known Allergies  Current Outpatient Prescriptions  Medication Sig Dispense Refill  . ALPRAZolam (XANAX) 0.5 MG tablet TAKE 1 TABLET 3 TIMES A DAY AS NEEDED  90 tablet  0  . amiodarone (PACERONE) 200 MG tablet TAKE 1 TABLET BY MOUTH ON M,T,W,TH,F AND ON SAT AND SUN TAKE ONLY 1/2 A TABLET  90 tablet  2  . Cholecalciferol (VITAMIN D3) 1000 UNITS CAPS Take 1 capsule by mouth daily.        . cycloSPORINE (RESTASIS) 0.05 % ophthalmic emulsion 1 drop 2 (two) times daily.        Marland Kitchen esomeprazole (NEXIUM) 40 MG capsule Take 1 capsule (40 mg total) by mouth daily before breakfast.  90 capsule  3  . ferrous sulfate 325 (65 FE) MG tablet Take 325 mg by mouth daily with breakfast.        . Fluticasone-Salmeterol (ADVAIR DISKUS) 250-50 MCG/DOSE AEPB Inhale 1 puff into the lungs every 12 (twelve) hours.  14 each  5  . levothyroxine (SYNTHROID, LEVOTHROID) 88 MCG tablet TAKE 1 TABLET EVERY DAY  90 tablet  1  . metoprolol (LOPRESSOR) 50 MG tablet       . metroNIDAZOLE (METROGEL) 1 % gel Apply 1 application topically daily.  45 g  6  . Multiple Vitamins-Minerals (ICAPS) CAPS Take 2 capsules by mouth daily.       . psyllium (METAMUCIL) 58.6 % powder Take 1 packet by mouth daily.         Marland Kitchen triamterene-hydrochlorothiazide (MAXZIDE-25) 37.5-25 MG per tablet TAKE 1 TABLET EVERY DAY  90 tablet  3  . warfarin (COUMADIN) 4 MG tablet TAKE 1 TABLET EVERY DAY EXCEPT TAKE 2 TABLETS ON FRIDAYS  102 tablet  2   Current Facility-Administered Medications  Medication Dose Route Frequency Provider Last Rate Last Dose  . TDaP (BOOSTRIX) injection 0.5 mL  0.5 mL Intramuscular Once Margaree Mackintosh, MD        Past Medical History  Diagnosis Date  . Symptomatic sinus bradycardia     s/p pacemaker  . Atrial flutter     s/p RFCA  . Atrial fibrillation   . Hypothyroidism   . GERD (gastroesophageal reflux disease)   . Esophageal stricture   . Anemia   . HTN (hypertension)     a.  echo 3/06: EF 55-65%, mild LVH, mild RVH  . Asthma   . Tremor, essential   . Diverticulosis   . Vertigo   . Urinary, incontinence, stress female   . DJD (degenerative joint disease)     knees  . Renal insufficiency   . Hearing loss   . Macular degeneration     Past Surgical History  Procedure Laterality  Date  . Pacemaker insertion    . Radiofrequency catheter ablation      ROS:  As stated in the HPI and negative for all other systems.  PHYSICAL EXAM BP 126/68  Pulse 66  Ht 5' 5.5" (1.664 m)  Wt 147 lb 12.8 oz (67.042 kg)  BMI 24.21 kg/m2 GENERAL:  Well appearing, but frail NECK:  No jugular venous distention, waveform within normal limits, carotid upstroke brisk and symmetric, no bruits, no thyromegaly LUNGS:  Clear to auscultation bilaterally BACK:  No CVA tenderness, lordosis CHEST:  Pacer pocket OK. HEART:  PMI not displaced or sustained,S1 and S2 within normal limits, no S3, no S4, no clicks, no rubs, no murmurs ABD:  Flat, positive bowel sounds normal in frequency in pitch, no bruits, no rebound, no guarding, no midline pulsatile mass, no hepatomegaly, no splenomegaly EXT:  2 plus pulses throughout, mild edema, no cyanosis no clubbing  EKG:  Atrial fibrillation with demand ventricular  pacing, no acute ST-T wave changes. 11/15/2012  ASSESSMENT AND PLAN  ATRIAL FIBRILLATION -  I did have her device interrogated today and review these results. She is in atrial fibrillation but this has been paroxysmal with no clear relation to her dyspnea. The longus sustained episode was about 1.5 hours with reasonable rate control. Given the fact that I can't correlate this with symptoms and it is not persistent no change in therapy is indicated. I still think the amiodarone is doing her good. She tolerates anticoagulation and there is no reason to switch to a NOAC.  CARDIAC PACEMAKER IN SITU -   As mentioned this was interrogated today.  She is approaching ERI. We will follow this more closely for the timing of replacement.  HYPERTENSION -  The blood pressure is at target. No change in medications is indicated. We will continue with therapeutic lifestyle changes (TLC).

## 2012-11-15 NOTE — Patient Instructions (Addendum)
The current medical regimen is effective;  continue present plan and medications.  Follow up in 1 year with Dr Hochrein.  You will receive a letter in the mail 2 months before you are due.  Please call us when you receive this letter to schedule your follow up appointment.  

## 2012-11-15 NOTE — Telephone Encounter (Signed)
Pts daughter, Samara Deist, advised and she verbalized understanding.

## 2012-11-15 NOTE — Telephone Encounter (Signed)
New problem   pts daughter wants to discuss pts appt today

## 2012-11-18 ENCOUNTER — Encounter: Payer: Self-pay | Admitting: Internal Medicine

## 2012-11-18 LAB — REMOTE PACEMAKER DEVICE
AL AMPLITUDE: 2.6734 mv
AL IMPEDENCE PM: 536 Ohm
ATRIAL PACING PM: 85.52
BAMS-0001: 170 {beats}/min
BATTERY VOLTAGE: 2.89 v
RV LEAD AMPLITUDE: 7.8899 mv
RV LEAD IMPEDENCE PM: 416 Ohm
VENTRICULAR PACING PM: 5.98

## 2012-11-21 ENCOUNTER — Telehealth: Payer: Self-pay

## 2012-11-21 NOTE — Telephone Encounter (Signed)
Patient states that the Advair makes her feel very lightheaded for about an hour after she takes it. Breathing is better, but doesn't like feeling woozy

## 2012-11-21 NOTE — Telephone Encounter (Signed)
Decrease Advair use to once daily at bedtime. We can reduce dose of Advair if still symptomatic although this is an uncommon complaint. The important thing is that her breathing is better.

## 2012-11-22 ENCOUNTER — Encounter: Payer: Self-pay | Admitting: *Deleted

## 2012-11-22 NOTE — Telephone Encounter (Signed)
Patient informed. 

## 2012-11-23 NOTE — Progress Notes (Signed)
  Subjective:    Patient ID: [name], female    DOB: Furr 05, 1923, 79 y.o.   MRN: [id number]  HPI 79 year old white female a long-standing history of recurrent benign positional vertigo, history of pacemaker insertion for sick sinus syndrome, history of anxiety in today complaining of being acutely ill. Says she went to eat breakfast this morning and did not feel well. Thought she might have syncopal episode but did not. Felt dizzy and slightly nauseated. Moreover she is complaining of shortness of breath. History of paroxysmal atrial fibrillation. She is on chronic anticoagulation therapy. Has appointment to see cardiologist in a few days. History of hypothyroidism. History of GE reflux.    Review of Systems     Objective:   Physical Exam skin is warm and dry. Nodes none. HEENT exam: Pharynx and TMs are clear. Neck is supple without JVD thyromegaly or carotid bruits. She is not orthostatic. Chest is clear to auscultation. Cardiac exam regular rate and rhythm normal S1 and S2. Extremities without significant edema. Neurological exam: Cranial nerves II through XII are grossly intact. Muscle strength is normal in the upper and lower extremities. No facial weakness. Gait is not ataxic. She moves slowly however. She is alert and oriented x3.        Assessment & Plan:  History of benign positional vertigo  History of shortness of breath-rule out congestive heart failure. Patient to get chest x-ray today. Do not hear rales in chest.  History of hypothyroidism  Fatigue and malaise  History of pacemaker  Chronic anticoagulation therapy  History of GE reflux   History of anxiety  Plan: Lab studies will be drawn including CBC with differential as she is on chronic anticoagulation therapy. She will have chest x-ray. Patient needs to stay better hydrated throughout the day drinking plenty of fluids.  25 minutes spent with patient.

## 2012-11-23 NOTE — Patient Instructions (Addendum)
Stay well hydrated and drink plenty of fluids. Please have chest x-ray. Lab studies are pending. Keep appointment with cardiologist later in the week.

## 2012-11-28 ENCOUNTER — Ambulatory Visit (INDEPENDENT_AMBULATORY_CARE_PROVIDER_SITE_OTHER): Payer: Medicare Other | Admitting: *Deleted

## 2012-11-28 ENCOUNTER — Telehealth: Payer: Self-pay | Admitting: Cardiology

## 2012-11-28 DIAGNOSIS — I4892 Unspecified atrial flutter: Secondary | ICD-10-CM

## 2012-11-28 DIAGNOSIS — I4891 Unspecified atrial fibrillation: Secondary | ICD-10-CM

## 2012-11-28 DIAGNOSIS — Z7901 Long term (current) use of anticoagulants: Secondary | ICD-10-CM

## 2012-11-28 DIAGNOSIS — H353190 Nonexudative age-related macular degeneration, unspecified eye, stage unspecified: Secondary | ICD-10-CM | POA: Diagnosis not present

## 2012-11-28 LAB — POCT INR: INR: 1.9

## 2012-11-28 NOTE — Telephone Encounter (Signed)
Dr Hazle Quant is her ophthalmologist .  Pt states he is concerned that the Amiodarone is making her macular degeneration worse and wasn't to know if there is anything else she could be changed to.  Aware I will forward information to Dr Antoine Poche and call back with recommendations and any new orders.

## 2012-11-28 NOTE — Telephone Encounter (Signed)
New problem   Her opthalmologist want her to talk to doctor about her taking Amiodarone b/c its effecting her eyes that want to know if there is a substitution.

## 2012-12-06 ENCOUNTER — Telehealth: Payer: Self-pay | Admitting: Cardiology

## 2012-12-06 NOTE — Telephone Encounter (Signed)
Pt calling to see if dr [name] has spoke to dr Hazle Quant re her amiodarone yet or not

## 2012-12-06 NOTE — Telephone Encounter (Signed)
Dr Antoine Poche has not spoken with him yet - will call pt once he has.

## 2012-12-11 DIAGNOSIS — H353190 Nonexudative age-related macular degeneration, unspecified eye, stage unspecified: Secondary | ICD-10-CM | POA: Diagnosis not present

## 2012-12-11 DIAGNOSIS — H43819 Vitreous degeneration, unspecified eye: Secondary | ICD-10-CM | POA: Diagnosis not present

## 2012-12-18 NOTE — Telephone Encounter (Signed)
She can stop the amiodarone.  She might have more afib and she could have more symptoms.  However, if this happens we can consider further medical therapy.

## 2012-12-19 NOTE — Telephone Encounter (Signed)
Pt aware and will try seeing how she feels off the amiodarone.  She will call back if problems or concerns.

## 2012-12-23 ENCOUNTER — Ambulatory Visit (INDEPENDENT_AMBULATORY_CARE_PROVIDER_SITE_OTHER): Payer: Medicare Other | Admitting: *Deleted

## 2012-12-23 DIAGNOSIS — I495 Sick sinus syndrome: Secondary | ICD-10-CM

## 2012-12-26 ENCOUNTER — Ambulatory Visit (INDEPENDENT_AMBULATORY_CARE_PROVIDER_SITE_OTHER): Payer: Medicare Other | Admitting: *Deleted

## 2012-12-26 DIAGNOSIS — I4892 Unspecified atrial flutter: Secondary | ICD-10-CM | POA: Diagnosis not present

## 2012-12-26 DIAGNOSIS — H04129 Dry eye syndrome of unspecified lacrimal gland: Secondary | ICD-10-CM | POA: Diagnosis not present

## 2012-12-26 DIAGNOSIS — I4891 Unspecified atrial fibrillation: Secondary | ICD-10-CM

## 2012-12-26 DIAGNOSIS — H353190 Nonexudative age-related macular degeneration, unspecified eye, stage unspecified: Secondary | ICD-10-CM | POA: Diagnosis not present

## 2012-12-26 DIAGNOSIS — Z7901 Long term (current) use of anticoagulants: Secondary | ICD-10-CM

## 2012-12-28 LAB — REMOTE PACEMAKER DEVICE
AL AMPLITUDE: 2.2 mv
ATRIAL PACING PM: 85.54
RV LEAD AMPLITUDE: 5.5 mv
VENTRICULAR PACING PM: 1.98

## 2012-12-31 ENCOUNTER — Encounter: Payer: Self-pay | Admitting: *Deleted

## 2012-12-31 NOTE — Addendum Note (Signed)
Addended by: Donalynn Furlong on: 12/31/2012 05:30 PM   Modules accepted: Level of Service

## 2013-01-01 ENCOUNTER — Encounter (HOSPITAL_COMMUNITY): Payer: Self-pay | Admitting: *Deleted

## 2013-01-01 ENCOUNTER — Encounter: Payer: Self-pay | Admitting: Internal Medicine

## 2013-01-01 ENCOUNTER — Emergency Department (INDEPENDENT_AMBULATORY_CARE_PROVIDER_SITE_OTHER)
Admission: EM | Admit: 2013-01-01 | Discharge: 2013-01-01 | Disposition: A | Discharge status: Home / Self Care | Payer: Medicare Other | Source: Home / Self Care

## 2013-01-01 ENCOUNTER — Telehealth: Payer: Self-pay

## 2013-01-01 DIAGNOSIS — B029 Zoster without complications: Secondary | ICD-10-CM

## 2013-01-01 DIAGNOSIS — L259 Unspecified contact dermatitis, unspecified cause: Secondary | ICD-10-CM

## 2013-01-01 DIAGNOSIS — L309 Dermatitis, unspecified: Secondary | ICD-10-CM

## 2013-01-01 MED ORDER — HYDROCORTISONE 2.5 % EX CREA: TOPICAL_CREAM | Freq: Two times a day (BID) | CUTANEOUS | Status: DC

## 2013-01-01 MED ORDER — ACYCLOVIR 800 MG PO TABS: 800.0000 mg | ORAL_TABLET | Freq: Every day | ORAL | Status: DC

## 2013-01-01 NOTE — ED Notes (Signed)
Pt  Has  A  Painfull  Rash  On   Back  And  l  Side  Chest        X  3 days         Sitting  Upright on  Exam table  Speaking  In  Complete  sentances     And  Is in no  Distress

## 2013-01-01 NOTE — Telephone Encounter (Signed)
Patient called yesterday to report she is having pain under her l breast for a few days now. No other symptoms, such as chest pain sob. Per Dr. Lenord Fellers advised to take Tylenol prn and call back if worsening. She then calls back today with same pain, but adds that she has a rash under her l breast, that is painful and "burning". Advised that she needs to be evaluated today an Urgent Care, for possible shingles. She is agreeable.

## 2013-01-01 NOTE — ED Provider Notes (Signed)
CSN: [id number]     Arrival date & time 01/01/13  1622 History   None    Chief Complaint  Patient presents with  . Rash   (Consider location/radiation/quality/duration/timing/severity/associated sxs/prior Treatment) Patient is a 79 y.o. female presenting with rash.  Rash Associated symptoms: no chest pain, no chills, no cough, no fatigue, no fever and no shortness of breath     Itching and aching. Itching along back and L breast. Started 4 days ago. Stopped wearing bra as it was irritatng the skin. Used hydrocortisone cream w/ some benefit. Tylenol 650mg  w/ some benefit. Denies trauma. Chicken pox as little girl.    Past Medical History  Diagnosis Date  . Symptomatic sinus bradycardia     s/p pacemaker  . Atrial flutter     s/p RFCA  . Atrial fibrillation   . Hypothyroidism   . GERD (gastroesophageal reflux disease)   . Esophageal stricture   . Anemia   . HTN (hypertension)     a.  echo 3/06: EF 55-65%, mild LVH, mild RVH  . Asthma   . Tremor, essential   . Diverticulosis   . Vertigo   . Urinary, incontinence, stress female   . DJD (degenerative joint disease)     knees  . Renal insufficiency   . Hearing loss   . Macular degeneration    Past Surgical History  Procedure Laterality Date  . Pacemaker insertion    . Radiofrequency catheter ablation     Family History  Problem Relation Age of Onset  . Cancer Mother     breast  . Cancer Father     leukemia  . Cirrhosis Brother     deceased   History  Substance Use Topics  . Smoking status: Former Smoker    Types: Cigarettes    Quit date: 02/04/1962  . Smokeless tobacco: Never Used  . Alcohol Use: Yes     Comment: rarely   OB History   Grav Para Term Preterm Abortions TAB SAB Ect Mult Living                 Review of Systems  Constitutional: Negative for fever, chills, diaphoresis, activity change, appetite change, fatigue and unexpected weight change.  Respiratory: Negative for cough and shortness of  breath.   Cardiovascular: Negative for chest pain.  Skin: Positive for rash.  All other systems reviewed and are negative.    Allergies  Review of patient's allergies indicates no known allergies.  Home Medications   Current Outpatient Rx  Name  Route  Sig  Dispense  Refill  . acyclovir (ZOVIRAX) 800 MG tablet   Oral   Take 1 tablet (800 mg total) by mouth daily.   7 tablet   0   . ALPRAZolam (XANAX) 0.5 MG tablet      TAKE 1 TABLET 3 TIMES A DAY AS NEEDED   90 tablet   0   . amiodarone (PACERONE) 200 MG tablet      TAKE 1 TABLET BY MOUTH ON M,T,W,TH,F AND ON SAT AND SUN TAKE ONLY 1/2 A TABLET   90 tablet   2   . Cholecalciferol (VITAMIN D3) 1000 UNITS CAPS   Oral   Take 1 capsule by mouth daily.           . cycloSPORINE (RESTASIS) 0.05 % ophthalmic emulsion      1 drop 2 (two) times daily.           Marland Kitchen esomeprazole (NEXIUM) 40 MG  capsule   Oral   Take 1 capsule (40 mg total) by mouth daily before breakfast.   90 capsule   3   . ferrous sulfate 325 (65 FE) MG tablet   Oral   Take 325 mg by mouth daily with breakfast.           . Fluticasone-Salmeterol (ADVAIR DISKUS) 250-50 MCG/DOSE AEPB   Inhalation   Inhale 1 puff into the lungs every 12 (twelve) hours.   14 each   5   . hydrocortisone 2.5 % cream   Topical   Apply topically 2 (two) times daily.   30 g   0   . levothyroxine (SYNTHROID, LEVOTHROID) 88 MCG tablet      TAKE 1 TABLET EVERY DAY   90 tablet   1   . metoprolol (LOPRESSOR) 50 MG tablet               . metroNIDAZOLE (METROGEL) 1 % gel   Topical   Apply 1 application topically daily.   45 g   6   . Multiple Vitamins-Minerals (ICAPS) CAPS   Oral   Take 2 capsules by mouth daily.          . psyllium (METAMUCIL) 58.6 % powder   Oral   Take 1 packet by mouth daily.           Marland Kitchen triamterene-hydrochlorothiazide (MAXZIDE-25) 37.5-25 MG per tablet      TAKE 1 TABLET EVERY DAY   90 tablet   3   . warfarin (COUMADIN)  4 MG tablet      Take as directed by coumadin clinic          BP 176/65  Pulse 60  Temp(Src) 98.5 F (36.9 C) (Oral)  Resp 16  SpO2 100% Physical Exam  Constitutional: She appears well-developed and well-nourished. No distress.  HENT:  Head: Normocephalic.  Eyes: Pupils are equal, round, and reactive to light.  Neck: Normal range of motion.  Cardiovascular: Normal rate, normal heart sounds and intact distal pulses.   No murmur heard. Pulmonary/Chest: Effort normal and breath sounds normal.  Abdominal: Soft. Bowel sounds are normal. She exhibits no distension.  Musculoskeletal: Normal range of motion. She exhibits no edema.  Skin:  Macular vesicular rash from the mid thoracic back extending laterally along dermatome line to mid anterior chest. Ttp. No surounding inudration or purulent discharge.   Psychiatric: She has a normal mood and affect. Her behavior is normal. Judgment and thought content normal.    ED Course  Procedures (including critical care time) Labs Review Labs Reviewed - No data to display Imaging Review No results found.  MDM   1. Shingles   2. Dermatitis    79yo F w/ likely shingles vs contact dermatitis. Due to renal impairment and CrCl of 22% will dose Acyclovir as 800mg  Qday x7 days and provide Hydrocortisone 2.5% cream for topical relief.  - precautions given and all questions answered  Shelly Flatten, MD Family Medicine PGY-3 01/01/2013, 5:41 PM      Ozella Rocks, MD 01/01/13 [phone]

## 2013-01-03 ENCOUNTER — Ambulatory Visit (INDEPENDENT_AMBULATORY_CARE_PROVIDER_SITE_OTHER): Payer: Medicare Other | Admitting: Internal Medicine

## 2013-01-03 ENCOUNTER — Encounter: Payer: Self-pay | Admitting: Internal Medicine

## 2013-01-03 VITALS — BP 142/76 | HR 76 | Temp 98.3°F | Wt 145.0 lb

## 2013-01-03 DIAGNOSIS — B029 Zoster without complications: Secondary | ICD-10-CM

## 2013-01-03 MED ORDER — HYDROCODONE-ACETAMINOPHEN 5-325 MG PO TABS: 1.0000 | ORAL_TABLET | Freq: Four times a day (QID) | ORAL | Status: DC | PRN

## 2013-01-03 NOTE — ED Provider Notes (Signed)
Medical screening examination/treatment/procedure(s) were performed by resident physician or non-physician practitioner and as supervising physician I was immediately available for consultation/collaboration.   Kloie Whiting DOUGLAS MD.   Rana Adorno D Laurabelle Gorczyca, MD 01/03/13 0913 

## 2013-01-03 NOTE — Patient Instructions (Addendum)
Take  Antiviral drug as prescribed by urgent care. Take Norco 5/325 every 8 hours  As needed for pain. Note you received the Zoster vaccine 2 years ago.

## 2013-01-03 NOTE — Progress Notes (Signed)
  Subjective:    Patient ID: [name], female    DOB: 08/11/1921, 79 y.o.   MRN: [id number]  HPI Patient seen at Urgent Care 01/01/2013 and diagnosed with shingles and started on acyclovir 800 mg daily for 7 days. She had Zostavax vaccine in 2012. Patient is not clear when she had onset of lesions. Just noticed some irritation under her left breast for several days. When I tried to clarify with her how long she had symptoms, it seems to be probably a week or more. She resides alone. Now having considerable itching and pain.    Review of Systems     Objective:   Physical Exam  multiple lesions left lateral trunk starting under left breast and radiating around to the left lateral rib cage area and left back consistent with herpes zoster        Assessment & Plan:  Herpes zoster  Plan: Continue acyclovir as previously prescribed. It was likely not started within 48 hours of outbreak based on her history but see no problem with continuing with it for nail. She did have Zostavax vaccine in 2012. She [place] apply calamine lotion to the lesions in between using hydrocortisone cream which was prescribed at urgent care. For pain, have prescribed Norco 5/325 every 8 hours when necessary pain. Explained to her it would take probably 2 weeks for lesions to completely resolve and she Mathena be left with some postherpetic neuralgia. She was worried about being contagious. Explained the process of shingles to her.

## 2013-01-06 ENCOUNTER — Other Ambulatory Visit: Payer: Self-pay

## 2013-01-06 MED ORDER — FLUTICASONE-SALMETEROL 250-50 MCG/DOSE IN AEPB: 1.0000 | INHALATION_SPRAY | Freq: Two times a day (BID) | RESPIRATORY_TRACT | Status: DC

## 2013-01-07 ENCOUNTER — Encounter: Payer: Self-pay | Admitting: Internal Medicine

## 2013-01-08 ENCOUNTER — Other Ambulatory Visit: Payer: Self-pay | Admitting: Internal Medicine

## 2013-01-08 NOTE — Telephone Encounter (Signed)
Refill x 6 months 

## 2013-01-20 ENCOUNTER — Telehealth: Payer: Self-pay | Admitting: Internal Medicine

## 2013-01-20 NOTE — Telephone Encounter (Signed)
Patient goes to [facility] on Thursday.  For transportation reasons, given appt to come at 12noon on Thursday, 10/23.

## 2013-01-20 NOTE — Telephone Encounter (Signed)
Can come Wednesday and get flu vaccine

## 2013-01-23 ENCOUNTER — Ambulatory Visit (INDEPENDENT_AMBULATORY_CARE_PROVIDER_SITE_OTHER): Payer: Medicare Other | Admitting: General Practice

## 2013-01-23 ENCOUNTER — Ambulatory Visit (INDEPENDENT_AMBULATORY_CARE_PROVIDER_SITE_OTHER): Payer: Medicare Other | Admitting: Internal Medicine

## 2013-01-23 DIAGNOSIS — Z23 Encounter for immunization: Secondary | ICD-10-CM | POA: Diagnosis not present

## 2013-01-23 DIAGNOSIS — I4891 Unspecified atrial fibrillation: Secondary | ICD-10-CM

## 2013-01-23 DIAGNOSIS — Z7901 Long term (current) use of anticoagulants: Secondary | ICD-10-CM | POA: Diagnosis not present

## 2013-01-23 DIAGNOSIS — I4892 Unspecified atrial flutter: Secondary | ICD-10-CM

## 2013-01-23 LAB — POCT INR: INR: 2

## 2013-01-27 ENCOUNTER — Ambulatory Visit (INDEPENDENT_AMBULATORY_CARE_PROVIDER_SITE_OTHER): Payer: Medicare Other | Admitting: *Deleted

## 2013-01-27 DIAGNOSIS — Z950 Presence of cardiac pacemaker: Secondary | ICD-10-CM | POA: Diagnosis not present

## 2013-01-27 DIAGNOSIS — I498 Other specified cardiac arrhythmias: Secondary | ICD-10-CM | POA: Diagnosis not present

## 2013-01-28 ENCOUNTER — Encounter: Payer: Self-pay | Admitting: Internal Medicine

## 2013-01-31 LAB — REMOTE PACEMAKER DEVICE
AL AMPLITUDE: 2.5 mv
BAMS-0001: 170 {beats}/min
BATTERY VOLTAGE: 2.89 V
VENTRICULAR PACING PM: 4.44

## 2013-02-04 ENCOUNTER — Other Ambulatory Visit: Payer: Self-pay | Admitting: Internal Medicine

## 2013-02-07 ENCOUNTER — Encounter: Payer: Self-pay | Admitting: *Deleted

## 2013-02-17 ENCOUNTER — Other Ambulatory Visit: Payer: Self-pay | Admitting: Internal Medicine

## 2013-02-20 ENCOUNTER — Ambulatory Visit (INDEPENDENT_AMBULATORY_CARE_PROVIDER_SITE_OTHER): Payer: Medicare Other | Admitting: *Deleted

## 2013-02-20 DIAGNOSIS — Z7901 Long term (current) use of anticoagulants: Secondary | ICD-10-CM | POA: Diagnosis not present

## 2013-02-20 DIAGNOSIS — I4892 Unspecified atrial flutter: Secondary | ICD-10-CM | POA: Diagnosis not present

## 2013-02-20 DIAGNOSIS — I4891 Unspecified atrial fibrillation: Secondary | ICD-10-CM | POA: Diagnosis not present

## 2013-03-02 ENCOUNTER — Encounter: Payer: Self-pay | Admitting: Internal Medicine

## 2013-03-02 NOTE — Progress Notes (Signed)
   Subjective:    Patient ID: [name], female    DOB: [birth year], 79 y.o.   MRN: [id number]  HPI Flu vaccine administered today. No complications.    Review of Systems     Objective:   Physical Exam Not examined       Assessment & Plan:  Flu vaccine administered by staff.

## 2013-03-03 ENCOUNTER — Ambulatory Visit (INDEPENDENT_AMBULATORY_CARE_PROVIDER_SITE_OTHER): Payer: Medicare Other | Admitting: *Deleted

## 2013-03-03 DIAGNOSIS — Z950 Presence of cardiac pacemaker: Secondary | ICD-10-CM

## 2013-03-03 LAB — MDC_IDC_ENUM_SESS_TYPE_REMOTE
Battery Voltage: 2.89 V
Brady Statistic AP VP Percent: 0.02 %
Brady Statistic AS VP Percent: 1.9 %
Brady Statistic RA Percent Paced: 59.53 %
Brady Statistic RV Percent Paced: 1.92 %
Lead Channel Sensing Intrinsic Amplitude: 2.3285
Lead Channel Sensing Intrinsic Amplitude: 6.5178
Lead Channel Setting Pacing Pulse Width: 0.4 ms

## 2013-03-03 NOTE — Patient Instructions (Signed)
Call if you have reaction to flu vaccine.

## 2013-03-06 ENCOUNTER — Ambulatory Visit (INDEPENDENT_AMBULATORY_CARE_PROVIDER_SITE_OTHER): Payer: Medicare Other | Admitting: Internal Medicine

## 2013-03-06 ENCOUNTER — Encounter: Payer: Self-pay | Admitting: Internal Medicine

## 2013-03-06 VITALS — BP 158/70 | HR 64 | Temp 97.6°F | Ht 63.75 in | Wt 140.0 lb

## 2013-03-06 DIAGNOSIS — E039 Hypothyroidism, unspecified: Secondary | ICD-10-CM | POA: Diagnosis not present

## 2013-03-06 LAB — TSH: TSH: 0.524 u[IU]/mL (ref 0.350–4.500)

## 2013-03-11 ENCOUNTER — Encounter: Payer: Self-pay | Admitting: Internal Medicine

## 2013-03-15 NOTE — Addendum Note (Signed)
Addended by: Glenda Chroman on: 03/15/2013 11:57 AM   Modules accepted: Level of Service

## 2013-03-18 ENCOUNTER — Encounter: Payer: Self-pay | Admitting: *Deleted

## 2013-03-20 ENCOUNTER — Ambulatory Visit (INDEPENDENT_AMBULATORY_CARE_PROVIDER_SITE_OTHER): Payer: Medicare Other | Admitting: *Deleted

## 2013-03-20 DIAGNOSIS — I4891 Unspecified atrial fibrillation: Secondary | ICD-10-CM | POA: Diagnosis not present

## 2013-03-20 DIAGNOSIS — Z7901 Long term (current) use of anticoagulants: Secondary | ICD-10-CM

## 2013-03-20 DIAGNOSIS — I4892 Unspecified atrial flutter: Secondary | ICD-10-CM

## 2013-03-20 LAB — POCT INR: INR: 1.9

## 2013-03-24 ENCOUNTER — Ambulatory Visit (INDEPENDENT_AMBULATORY_CARE_PROVIDER_SITE_OTHER): Payer: Medicare Other | Admitting: Internal Medicine

## 2013-03-24 ENCOUNTER — Encounter: Payer: Self-pay | Admitting: Internal Medicine

## 2013-03-24 VITALS — BP 130/77 | HR 77 | Temp 97.6°F | Resp 18 | Wt 140.0 lb

## 2013-03-24 DIAGNOSIS — M25511 Pain in right shoulder: Secondary | ICD-10-CM

## 2013-03-24 DIAGNOSIS — M25519 Pain in unspecified shoulder: Secondary | ICD-10-CM | POA: Diagnosis not present

## 2013-03-24 MED ORDER — METHYLPREDNISOLONE (PAK) 4 MG PO TABS: ORAL_TABLET | ORAL | Status: DC

## 2013-03-24 NOTE — Progress Notes (Signed)
   Subjective:    Patient ID: [name], female    DOB: [birth year], 79 y.o.   MRN: [id number]  HPI Complaint of right shoulder and upper arm pain. Seems to be worse at night. Tried heating pad without success. Had to take a pain pill a couple of nights to get relief. Has not tried ice on shoulder. Denies any injury or fall. No complaint of decreased range of motion in right shoulder.    Review of Systems     Objective:   Physical Exam Right shoulder: Normal range of motion. Muscle strength is 4/5 in all groups tested. Deep tendon reflexes in the right upper extremity are 2  plus and symmetrical. She is tender along her right anterior glenohumeral joint. Tender along right trapezius muscle and upper right scapula.       Assessment & Plan:  Right shoulder arthropathy  Plan: She is on chronic anticoagulation therapy and we cannot perform injection with steroids. I placed her on a Medrol 4 mg 6 day dosepak. She doesn't want to go to physical therapy. If not better in 2 weeks, she will need shoulder x-rays and referral to orthopedist.

## 2013-03-24 NOTE — Patient Instructions (Signed)
Take Medrol dose pack as prescribed. Call if not better in 2 weeks. Pt declines physical therapy at this time.

## 2013-04-04 ENCOUNTER — Ambulatory Visit (INDEPENDENT_AMBULATORY_CARE_PROVIDER_SITE_OTHER): Payer: Medicare Other | Admitting: Pharmacist

## 2013-04-04 DIAGNOSIS — I4892 Unspecified atrial flutter: Secondary | ICD-10-CM | POA: Diagnosis not present

## 2013-04-04 DIAGNOSIS — I4891 Unspecified atrial fibrillation: Secondary | ICD-10-CM | POA: Diagnosis not present

## 2013-04-04 DIAGNOSIS — Z7901 Long term (current) use of anticoagulants: Secondary | ICD-10-CM | POA: Diagnosis not present

## 2013-04-04 LAB — POCT INR: INR: 2.4

## 2013-04-07 ENCOUNTER — Ambulatory Visit (INDEPENDENT_AMBULATORY_CARE_PROVIDER_SITE_OTHER): Payer: Medicare Other | Admitting: *Deleted

## 2013-04-07 DIAGNOSIS — I495 Sick sinus syndrome: Secondary | ICD-10-CM

## 2013-04-09 ENCOUNTER — Encounter: Payer: Self-pay | Admitting: Internal Medicine

## 2013-04-09 LAB — MDC_IDC_ENUM_SESS_TYPE_REMOTE
Battery Voltage: 2.89 V
Brady Statistic AS VP Percent: 1.58 %
Brady Statistic AS VS Percent: 47.81 %
Lead Channel Impedance Value: 376 Ohm
Lead Channel Impedance Value: 624 Ohm
Lead Channel Sensing Intrinsic Amplitude: 3.0184
Lead Channel Setting Pacing Amplitude: 2 V
Lead Channel Setting Pacing Amplitude: 3 V
Lead Channel Setting Pacing Pulse Width: 0.4 ms
Lead Channel Setting Sensing Sensitivity: 0.9 mV
MDC IDC MSMT LEADCHNL RV SENSING INTR AMPL: 5.4886
MDC IDC SESS DTM: 20150107133009
MDC IDC SET ZONE DETECTION INTERVAL: 350 ms
MDC IDC SET ZONE DETECTION INTERVAL: 400 ms
MDC IDC STAT BRADY AP VP PERCENT: 0.01 %
MDC IDC STAT BRADY AP VS PERCENT: 50.6 %
MDC IDC STAT BRADY RA PERCENT PACED: 50.62 %
MDC IDC STAT BRADY RV PERCENT PACED: 1.59 %

## 2013-04-16 ENCOUNTER — Encounter: Payer: Self-pay | Admitting: *Deleted

## 2013-04-18 ENCOUNTER — Other Ambulatory Visit: Payer: Self-pay | Admitting: Internal Medicine

## 2013-04-18 NOTE — Telephone Encounter (Signed)
Needs to be refilled by Clive who prescribed this originally and monitors this

## 2013-04-25 ENCOUNTER — Ambulatory Visit (INDEPENDENT_AMBULATORY_CARE_PROVIDER_SITE_OTHER): Payer: Medicare Other | Admitting: Pharmacist

## 2013-04-25 DIAGNOSIS — I4891 Unspecified atrial fibrillation: Secondary | ICD-10-CM

## 2013-04-25 DIAGNOSIS — Z7901 Long term (current) use of anticoagulants: Secondary | ICD-10-CM

## 2013-04-25 DIAGNOSIS — I4892 Unspecified atrial flutter: Secondary | ICD-10-CM

## 2013-04-25 LAB — POCT INR: INR: 1.5

## 2013-05-05 NOTE — Addendum Note (Signed)
Addended by: Sharlot Gowda on: 05/05/2013 10:36 AM   Modules accepted: Level of Service

## 2013-05-13 ENCOUNTER — Ambulatory Visit (INDEPENDENT_AMBULATORY_CARE_PROVIDER_SITE_OTHER): Payer: Medicare Other | Admitting: Internal Medicine

## 2013-05-13 ENCOUNTER — Encounter: Payer: Self-pay | Admitting: Internal Medicine

## 2013-05-13 ENCOUNTER — Ambulatory Visit (INDEPENDENT_AMBULATORY_CARE_PROVIDER_SITE_OTHER): Payer: Medicare Other | Admitting: *Deleted

## 2013-05-13 VITALS — BP 132/84 | HR 80 | Ht 65.0 in | Wt 140.5 lb

## 2013-05-13 DIAGNOSIS — I4892 Unspecified atrial flutter: Secondary | ICD-10-CM | POA: Diagnosis not present

## 2013-05-13 DIAGNOSIS — Z950 Presence of cardiac pacemaker: Secondary | ICD-10-CM

## 2013-05-13 DIAGNOSIS — Z5181 Encounter for therapeutic drug level monitoring: Secondary | ICD-10-CM

## 2013-05-13 DIAGNOSIS — I4891 Unspecified atrial fibrillation: Secondary | ICD-10-CM

## 2013-05-13 DIAGNOSIS — R002 Palpitations: Secondary | ICD-10-CM

## 2013-05-13 DIAGNOSIS — I10 Essential (primary) hypertension: Secondary | ICD-10-CM | POA: Diagnosis not present

## 2013-05-13 DIAGNOSIS — Z7901 Long term (current) use of anticoagulants: Secondary | ICD-10-CM | POA: Diagnosis not present

## 2013-05-13 LAB — POCT INR: INR: 1.8

## 2013-05-13 MED ORDER — METOPROLOL TARTRATE 50 MG PO TABS: 50.0000 mg | ORAL_TABLET | Freq: Two times a day (BID) | ORAL | Status: DC

## 2013-05-13 NOTE — Assessment & Plan Note (Signed)
Her blood pressure is fairly well controlled. Hopefully she will tolerate her BID dosing of beta blocker.

## 2013-05-13 NOTE — Assessment & Plan Note (Signed)
Her symptoms are present in the morning. This is a new problem and I think due to the beta blocker wearing off as she gets better after she has taken her morning meds.

## 2013-05-13 NOTE — Assessment & Plan Note (Signed)
Her atrial fib has increased with stopping of the amiodarone. I have recommended she uptitrate her dose of metoprolol to 50 mg twice daily.

## 2013-05-13 NOTE — Progress Notes (Signed)
HPI [name] returns today for followup. She is a very pleasant 80 year old woman with a history of symptomatic bradycardia, paroxysmal atrial fibrillation, status post permanent pacemaker insertion. In the interim, she has done well. She does have palpitations in the morning. She stopped her amiodarone since I saw her last and her atrial fib has increased. She denies syncope. She is only taking one dose of short acting beta blocker a day.  No Known Allergies   Current Outpatient Prescriptions  Medication Sig Dispense Refill  . ALPRAZolam (XANAX) 0.5 MG tablet TAKE 1 TABLET BY MOUTH 3 TIMES A DAY AS NEEDED  90 tablet  5  . Cholecalciferol (VITAMIN D3) 1000 UNITS CAPS Take 1 capsule by mouth daily.        . cycloSPORINE (RESTASIS) 0.05 % ophthalmic emulsion 1 drop 2 (two) times daily.        Marland Kitchen esomeprazole (NEXIUM) 40 MG capsule Take 1 capsule (40 mg total) by mouth daily before breakfast.  90 capsule  3  . ferrous sulfate 325 (65 FE) MG tablet Take 325 mg by mouth daily with breakfast.        . Fluticasone-Salmeterol (ADVAIR DISKUS) 250-50 MCG/DOSE AEPB Inhale 1 puff into the lungs every 12 (twelve) hours.  60 each  5  . levothyroxine (SYNTHROID, LEVOTHROID) 88 MCG tablet TAKE 1 TABLET EVERY DAY  90 tablet  1  . metoprolol (LOPRESSOR) 50 MG tablet Take 1 tablet (50 mg total) by mouth 2 (two) times daily.  180 tablet  3  . metroNIDAZOLE (METROGEL) 1 % gel Apply 1 application topically daily.  45 g  6  . Multiple Vitamins-Minerals (ICAPS) CAPS Take 2 capsules by mouth daily.       . psyllium (METAMUCIL) 58.6 % powder Take 1 packet by mouth daily.        Marland Kitchen triamterene-hydrochlorothiazide (MAXZIDE-25) 37.5-25 MG per tablet TAKE 1 TABLET EVERY DAY  90 tablet  3  . warfarin (COUMADIN) 4 MG tablet Take as directed by coumadin clinic       Current Facility-Administered Medications  Medication Dose Route Frequency Provider Last Rate Last Dose  . TDaP (BOOSTRIX) injection 0.5 mL  0.5 mL Intramuscular  Once Elby Showers, MD         Past Medical History  Diagnosis Date  . Symptomatic sinus bradycardia     s/p pacemaker  . Atrial flutter     s/p RFCA  . Atrial fibrillation   . Hypothyroidism   . GERD (gastroesophageal reflux disease)   . Esophageal stricture   . Anemia   . HTN (hypertension)     a.  echo 3/06: EF 55-65%, mild LVH, mild RVH  . Asthma   . Tremor, essential   . Diverticulosis   . Vertigo   . Urinary, incontinence, stress female   . DJD (degenerative joint disease)     knees  . Renal insufficiency   . Hearing loss   . Macular degeneration     ROS:   All systems reviewed and negative except as noted in the HPI.   Past Surgical History  Procedure Laterality Date  . Pacemaker insertion    . Radiofrequency catheter ablation       Family History  Problem Relation Age of Onset  . Cancer Mother     breast  . Cancer Father     leukemia  . Cirrhosis Brother     deceased     History   Social History  . Marital Status:  Widowed    Spouse Name: N/A    Number of Children: 2  . Years of Education: N/A   Occupational History  . 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 Smoking status: Former Smoker    Types: Cigarettes    Quit date: 02/04/1962  . Smokeless tobacco: Never Used  . Alcohol Use: Yes     Comment: rarely  . Drug Use: No  . Sexual Activity: Not on file   Other Topics Concern  . Not on file   Social History Narrative  . No narrative on file     BP 132/84  Pulse 80  Ht 5\' 5"  (1.651 m)  Wt 140 lb 8 oz (63.73 kg)  BMI 23.38 kg/m2  Physical Exam:  Well appearing 80 year old woman, NAD HEENT: Unremarkable Neck:  No JVD, no thyromegally Lungs:  Clear with no wheezes, rales, or rhonchi. HEART:  IRegular rate rhythm, no murmurs, no rubs, no clicks Abd:  soft, positive bowel sounds, no organomegally, no rebound, no guarding Ext:  2 plus pulses, no edema, no cyanosis, no clubbing Skin:  No rashes no nodules Neuro:   CN II through XII intact, motor grossly intact  DEVICE  Normal device function.  See PaceArt for details.   Assess/Plan:

## 2013-05-13 NOTE — Patient Instructions (Addendum)
Your physician wants you to follow-up in: 12 months with Dr Knox Saliva will receive a reminder letter in the mail two months in advance. If you don't receive a letter, please call our office to schedule the follow-up appointment.   Remote monitoring is used to monitor your Pacemaker or ICD from home. This monitoring reduces the number of office visits required to check your device to one time per year. It allows [place] to keep an eye on the functioning of your device to ensure it is working properly. You are scheduled for a device check from home on 08/14/13. You Wedeking send your transmission at any time that day. If you have a wireless device, the transmission will be sent automatically. After your physician reviews your transmission, you will receive a postcard with your next transmission date.   Your physician has recommended you make the following change in your medication: 1) Increase Metoprolol to 50mg  in am and 25mg  in the pm for 2 weeks then increase to 50mg  twice daily

## 2013-05-14 LAB — MDC_IDC_ENUM_SESS_TYPE_INCLINIC
Brady Statistic AP VP Percent: 0.02 %
Brady Statistic AP VS Percent: 58.85 %
Brady Statistic AS VP Percent: 2.35 %
Brady Statistic AS VS Percent: 38.78 %
Brady Statistic RA Percent Paced: 58.87 %
Brady Statistic RV Percent Paced: 2.37 %
Lead Channel Impedance Value: 392 Ohm
Lead Channel Impedance Value: 560 Ohm
Lead Channel Pacing Threshold Pulse Width: 0.4 ms
Lead Channel Sensing Intrinsic Amplitude: 4.1 mV
Lead Channel Setting Pacing Amplitude: 2 V
Lead Channel Setting Pacing Amplitude: 3 V
Lead Channel Setting Pacing Pulse Width: 0.4 ms
MDC IDC MSMT BATTERY VOLTAGE: 2.88 V
MDC IDC MSMT LEADCHNL RV PACING THRESHOLD AMPLITUDE: 1.5 V
MDC IDC MSMT LEADCHNL RV SENSING INTR AMPL: 10.6 mV
MDC IDC SESS DTM: 20150210173918
MDC IDC SET LEADCHNL RV SENSING SENSITIVITY: 0.9 mV
MDC IDC SET ZONE DETECTION INTERVAL: 350 ms
MDC IDC SET ZONE DETECTION INTERVAL: 400 ms

## 2013-06-03 ENCOUNTER — Ambulatory Visit (INDEPENDENT_AMBULATORY_CARE_PROVIDER_SITE_OTHER): Payer: Medicare Other | Admitting: Pharmacist

## 2013-06-03 DIAGNOSIS — I4891 Unspecified atrial fibrillation: Secondary | ICD-10-CM | POA: Diagnosis not present

## 2013-06-03 DIAGNOSIS — I4892 Unspecified atrial flutter: Secondary | ICD-10-CM | POA: Diagnosis not present

## 2013-06-03 DIAGNOSIS — Z5181 Encounter for therapeutic drug level monitoring: Secondary | ICD-10-CM | POA: Diagnosis not present

## 2013-06-03 DIAGNOSIS — Z7901 Long term (current) use of anticoagulants: Secondary | ICD-10-CM

## 2013-06-03 LAB — POCT INR: INR: 1.7

## 2013-06-09 ENCOUNTER — Inpatient Hospital Stay (HOSPITAL_COMMUNITY)
Admission: EM | Admit: 2013-06-09 | Discharge: 2013-06-16 | DRG: 291 | Disposition: A | Discharge status: Home Health | Payer: Medicare Other | Attending: Internal Medicine | Admitting: Internal Medicine

## 2013-06-09 ENCOUNTER — Ambulatory Visit
Admission: RE | Admit: 2013-06-09 | Discharge: 2013-06-09 | Disposition: A | Discharge status: Home / Self Care | Payer: Medicare Other | Source: Ambulatory Visit | Attending: Internal Medicine | Admitting: Internal Medicine

## 2013-06-09 ENCOUNTER — Encounter (HOSPITAL_COMMUNITY): Payer: Self-pay | Admitting: Emergency Medicine

## 2013-06-09 ENCOUNTER — Other Ambulatory Visit: Payer: Self-pay | Admitting: Internal Medicine

## 2013-06-09 ENCOUNTER — Ambulatory Visit (INDEPENDENT_AMBULATORY_CARE_PROVIDER_SITE_OTHER): Payer: Medicare Other | Admitting: Internal Medicine

## 2013-06-09 ENCOUNTER — Encounter: Payer: Self-pay | Admitting: Internal Medicine

## 2013-06-09 VITALS — BP 134/78 | HR 118 | Temp 99.2°F | Wt 148.5 lb

## 2013-06-09 DIAGNOSIS — R0600 Dyspnea, unspecified: Secondary | ICD-10-CM | POA: Diagnosis present

## 2013-06-09 DIAGNOSIS — I130 Hypertensive heart and chronic kidney disease with heart failure and stage 1 through stage 4 chronic kidney disease, or unspecified chronic kidney disease: Secondary | ICD-10-CM | POA: Diagnosis present

## 2013-06-09 DIAGNOSIS — E039 Hypothyroidism, unspecified: Secondary | ICD-10-CM | POA: Diagnosis present

## 2013-06-09 DIAGNOSIS — D539 Nutritional anemia, unspecified: Secondary | ICD-10-CM | POA: Diagnosis not present

## 2013-06-09 DIAGNOSIS — I482 Chronic atrial fibrillation, unspecified: Secondary | ICD-10-CM | POA: Diagnosis present

## 2013-06-09 DIAGNOSIS — I059 Rheumatic mitral valve disease, unspecified: Secondary | ICD-10-CM | POA: Diagnosis not present

## 2013-06-09 DIAGNOSIS — Z950 Presence of cardiac pacemaker: Secondary | ICD-10-CM | POA: Diagnosis not present

## 2013-06-09 DIAGNOSIS — I5031 Acute diastolic (congestive) heart failure: Secondary | ICD-10-CM | POA: Diagnosis not present

## 2013-06-09 DIAGNOSIS — J45909 Unspecified asthma, uncomplicated: Secondary | ICD-10-CM | POA: Diagnosis present

## 2013-06-09 DIAGNOSIS — D649 Anemia, unspecified: Secondary | ICD-10-CM | POA: Diagnosis not present

## 2013-06-09 DIAGNOSIS — M1710 Unilateral primary osteoarthritis, unspecified knee: Secondary | ICD-10-CM | POA: Diagnosis present

## 2013-06-09 DIAGNOSIS — H3530 Unspecified macular degeneration: Secondary | ICD-10-CM | POA: Insufficient documentation

## 2013-06-09 DIAGNOSIS — R0609 Other forms of dyspnea: Secondary | ICD-10-CM | POA: Diagnosis present

## 2013-06-09 DIAGNOSIS — J449 Chronic obstructive pulmonary disease, unspecified: Secondary | ICD-10-CM | POA: Diagnosis not present

## 2013-06-09 DIAGNOSIS — K219 Gastro-esophageal reflux disease without esophagitis: Secondary | ICD-10-CM | POA: Diagnosis present

## 2013-06-09 DIAGNOSIS — N184 Chronic kidney disease, stage 4 (severe): Secondary | ICD-10-CM | POA: Diagnosis present

## 2013-06-09 DIAGNOSIS — R0989 Other specified symptoms and signs involving the circulatory and respiratory systems: Secondary | ICD-10-CM | POA: Diagnosis not present

## 2013-06-09 DIAGNOSIS — I10 Essential (primary) hypertension: Secondary | ICD-10-CM | POA: Diagnosis not present

## 2013-06-09 DIAGNOSIS — N183 Chronic kidney disease, stage 3 unspecified: Secondary | ICD-10-CM | POA: Diagnosis present

## 2013-06-09 DIAGNOSIS — I4892 Unspecified atrial flutter: Secondary | ICD-10-CM | POA: Diagnosis not present

## 2013-06-09 DIAGNOSIS — I509 Heart failure, unspecified: Secondary | ICD-10-CM | POA: Diagnosis present

## 2013-06-09 DIAGNOSIS — J189 Pneumonia, unspecified organism: Secondary | ICD-10-CM | POA: Diagnosis present

## 2013-06-09 DIAGNOSIS — E869 Volume depletion, unspecified: Secondary | ICD-10-CM | POA: Diagnosis present

## 2013-06-09 DIAGNOSIS — Z7901 Long term (current) use of anticoagulants: Secondary | ICD-10-CM

## 2013-06-09 DIAGNOSIS — I4891 Unspecified atrial fibrillation: Secondary | ICD-10-CM | POA: Diagnosis not present

## 2013-06-09 DIAGNOSIS — R0602 Shortness of breath: Secondary | ICD-10-CM | POA: Diagnosis not present

## 2013-06-09 DIAGNOSIS — I119 Hypertensive heart disease without heart failure: Secondary | ICD-10-CM | POA: Diagnosis present

## 2013-06-09 DIAGNOSIS — N189 Chronic kidney disease, unspecified: Secondary | ICD-10-CM

## 2013-06-09 DIAGNOSIS — Z87891 Personal history of nicotine dependence: Secondary | ICD-10-CM

## 2013-06-09 LAB — CBC WITH DIFFERENTIAL/PLATELET
Basophils Absolute: 0 10*3/uL (ref 0.0–0.1)
EOS ABS: 0 10*3/uL (ref 0.0–0.7)
HCT: 32.4 % — ABNORMAL LOW (ref 36.0–46.0)
Hemoglobin: 10.8 g/dL — ABNORMAL LOW (ref 12.0–15.0)
LYMPHS ABS: 1.2 10*3/uL (ref 0.7–4.0)
LYMPHS PCT: 14 % (ref 12–46)
MCH: 32.7 pg (ref 26.0–34.0)
MCHC: 33.4 g/dL (ref 30.0–36.0)
MCV: 98.1 fL (ref 78.0–100.0)
Monocytes Absolute: 0.6 10*3/uL (ref 0.1–1.0)
Monocytes Relative: 7 % (ref 3–12)
NEUTROS ABS: 7 10*3/uL (ref 1.7–7.7)
Neutrophils Relative %: 79 % — ABNORMAL HIGH (ref 43–77)
Platelets: 204 10*3/uL (ref 150–400)
RBC: 3.3 MIL/uL — AB (ref 3.87–5.11)
RDW: 12.7 % (ref 11.5–15.5)
WBC: 8.8 10*3/uL (ref 4.0–10.5)

## 2013-06-09 LAB — COMPREHENSIVE METABOLIC PANEL
ALBUMIN: 3.4 g/dL — AB (ref 3.5–5.2)
ALT: 28 U/L (ref 0–35)
AST: 25 U/L (ref 0–37)
Alkaline Phosphatase: 59 U/L (ref 39–117)
BUN: 22 mg/dL (ref 6–23)
CO2: 24 meq/L (ref 19–32)
Calcium: 8.8 mg/dL (ref 8.4–10.5)
Chloride: 104 mEq/L (ref 96–112)
Creat: 1.5 mg/dL — ABNORMAL HIGH (ref 0.50–1.10)
GLUCOSE: 114 mg/dL — AB (ref 70–99)
Potassium: 4.5 mEq/L (ref 3.5–5.3)
SODIUM: 138 meq/L (ref 135–145)
TOTAL PROTEIN: 5.9 g/dL — AB (ref 6.0–8.3)
Total Bilirubin: 0.8 mg/dL (ref 0.2–1.2)

## 2013-06-09 LAB — PROTIME-INR
INR: 2.4 — ABNORMAL HIGH (ref 0.00–1.49)
PROTHROMBIN TIME: 25.4 s — AB (ref 11.6–15.2)

## 2013-06-09 LAB — BRAIN NATRIURETIC PEPTIDE: BRAIN NATRIURETIC PEPTIDE: 624.4 pg/mL — AB (ref 0.0–100.0)

## 2013-06-09 LAB — PRO B NATRIURETIC PEPTIDE: Pro B Natriuretic peptide (BNP): 8505 pg/mL — ABNORMAL HIGH (ref 0–450)

## 2013-06-09 LAB — RETICULOCYTES
ABS Retic: 43.3 10*3/uL (ref 19.0–186.0)
RBC.: 3.61 MIL/uL — ABNORMAL LOW (ref 3.87–5.11)
RETIC CT PCT: 1.2 % (ref 0.4–2.3)

## 2013-06-09 LAB — TROPONIN I: Troponin I: <0.3 ng/mL (ref <0.30)

## 2013-06-09 MED ORDER — ONDANSETRON HCL 4 MG/2ML IJ SOLN: 4.0000 mg | Freq: Four times a day (QID) | INTRAMUSCULAR | Status: DC | PRN

## 2013-06-09 MED ORDER — WARFARIN SODIUM 4 MG PO TABS
4.0000 mg | ORAL_TABLET | ORAL | Status: DC
  Administered 2013-06-11: 4 mg via ORAL
  Filled 2013-06-09 (×2): qty 1

## 2013-06-09 MED ORDER — BUDESONIDE-FORMOTEROL FUMARATE 80-4.5 MCG/ACT IN AERO
2.0000 | INHALATION_SPRAY | Freq: Two times a day (BID) | RESPIRATORY_TRACT | Status: DC
  Administered 2013-06-09 – 2013-06-16 (×14): 2 via RESPIRATORY_TRACT
  Filled 2013-06-09: qty 6.9

## 2013-06-09 MED ORDER — METOPROLOL TARTRATE 50 MG PO TABS
50.0000 mg | ORAL_TABLET | Freq: Two times a day (BID) | ORAL | Status: DC
  Administered 2013-06-09 – 2013-06-11 (×5): 50 mg via ORAL
  Filled 2013-06-09 (×7): qty 1

## 2013-06-09 MED ORDER — WARFARIN SODIUM 6 MG PO TABS
6.0000 mg | ORAL_TABLET | ORAL | Status: DC
  Administered 2013-06-10: 6 mg via ORAL
  Filled 2013-06-09: qty 1

## 2013-06-09 MED ORDER — WARFARIN SODIUM 4 MG PO TABS: 4.0000 mg | ORAL_TABLET | Freq: Every day | ORAL | Status: DC

## 2013-06-09 MED ORDER — FUROSEMIDE 10 MG/ML IJ SOLN
40.0000 mg | Freq: Once | INTRAMUSCULAR | Status: AC
  Administered 2013-06-09: 40 mg via INTRAVENOUS
  Filled 2013-06-09: qty 4

## 2013-06-09 MED ORDER — MOMETASONE FURO-FORMOTEROL FUM 100-5 MCG/ACT IN AERO: 2.0000 | INHALATION_SPRAY | Freq: Two times a day (BID) | RESPIRATORY_TRACT | Status: DC

## 2013-06-09 MED ORDER — ACETAMINOPHEN 325 MG PO TABS: 650.0000 mg | ORAL_TABLET | ORAL | Status: DC | PRN

## 2013-06-09 MED ORDER — WARFARIN - PHARMACIST DOSING INPATIENT
Freq: Every day | Status: DC
  Administered 2013-06-11: 18:00:00

## 2013-06-09 MED ORDER — ALPRAZOLAM 0.25 MG PO TABS
0.2500 mg | ORAL_TABLET | Freq: Three times a day (TID) | ORAL | Status: DC | PRN
  Administered 2013-06-10 – 2013-06-16 (×5): 0.25 mg via ORAL
  Filled 2013-06-09 (×5): qty 1

## 2013-06-09 MED ORDER — CYCLOSPORINE 0.05 % OP EMUL
1.0000 [drp] | Freq: Two times a day (BID) | OPHTHALMIC | Status: DC
  Administered 2013-06-09 – 2013-06-14 (×10): 1 [drp] via OPHTHALMIC
  Administered 2013-06-14: 10:00:00 via OPHTHALMIC
  Administered 2013-06-15 – 2013-06-16 (×3): 1 [drp] via OPHTHALMIC
  Filled 2013-06-09 (×15): qty 1

## 2013-06-09 MED ORDER — WARFARIN SODIUM 4 MG PO TABS
4.0000 mg | ORAL_TABLET | Freq: Once | ORAL | Status: AC
  Administered 2013-06-09: 4 mg via ORAL
  Filled 2013-06-09: qty 1

## 2013-06-09 MED ORDER — DEXTROSE 5 % IV SOLN
1.0000 g | INTRAVENOUS | Status: AC
  Administered 2013-06-09 – 2013-06-11 (×3): 1 g via INTRAVENOUS
  Filled 2013-06-09 (×3): qty 10

## 2013-06-09 MED ORDER — PANTOPRAZOLE SODIUM 40 MG PO TBEC
80.0000 mg | DELAYED_RELEASE_TABLET | Freq: Every day | ORAL | Status: DC
  Administered 2013-06-10 – 2013-06-16 (×7): 80 mg via ORAL
  Filled 2013-06-09 (×9): qty 2

## 2013-06-09 MED ORDER — PSYLLIUM 95 % PO PACK
1.0000 | PACK | Freq: Every day | ORAL | Status: DC
  Administered 2013-06-10 – 2013-06-15 (×6): 1 via ORAL
  Filled 2013-06-09 (×7): qty 1

## 2013-06-09 MED ORDER — LEVOTHYROXINE SODIUM 88 MCG PO TABS
88.0000 ug | ORAL_TABLET | Freq: Every evening | ORAL | Status: DC
  Administered 2013-06-09 – 2013-06-15 (×7): 88 ug via ORAL
  Filled 2013-06-09 (×8): qty 1

## 2013-06-09 MED ORDER — FERROUS SULFATE 325 (65 FE) MG PO TABS
325.0000 mg | ORAL_TABLET | Freq: Every day | ORAL | Status: DC
  Administered 2013-06-10 – 2013-06-16 (×7): 325 mg via ORAL
  Filled 2013-06-09 (×8): qty 1

## 2013-06-09 NOTE — Progress Notes (Signed)
ANTICOAGULATION CONSULT NOTE - Initial Consult  Pharmacy Consult for coumadin Indication: atrial fibrillation  No Known Allergies     Vital Signs: Temp: 98.1 F (36.7 C) (03/09 1549) Temp src: Oral (03/09 1549) BP: 130/87 mmHg (03/09 1731) Pulse Rate: 107 (03/09 1549)  Labs:  Recent Labs  06/09/13 1225 06/09/13 1551  HGB 10.8*  --   HCT 32.4*  --   PLT 204  --   LABPROT  --  25.4*  INR  --  2.40*  CREATININE 1.50*  --   TROPONINI  --  <0.30    The CrCl is unknown because both a height and weight (above a minimum accepted value) are required for this calculation.   Medical History: Past Medical History  Diagnosis Date  . Symptomatic sinus bradycardia     s/p pacemaker  . Atrial flutter     s/p RFCA  . Atrial fibrillation   . Hypothyroidism   . GERD (gastroesophageal reflux disease)   . Esophageal stricture   . Anemia   . HTN (hypertension)     a.  echo 3/06: EF 55-65%, mild LVH, mild RVH  . Asthma   . Tremor, essential   . Diverticulosis   . Vertigo   . Urinary, incontinence, stress female   . DJD (degenerative joint disease)     knees  . Renal insufficiency   . Hearing loss   . Macular degeneration     Medications:  See med rec  Assessment: Patient is an 80 y.o F on coumadin PTA for Afib.  Per patient, home regimen is 4mg  daily except 6mg  onTuesdays with last dose taken on 3/8.  INR is therapeutic today at 2.40.  Goal of Therapy:  INR 2-3     Plan:  1) resume home regimen of 4mg  daily except 6mg  on Tuesdays 2) daily INR  [name] P 06/09/2013,5:57 PM

## 2013-06-09 NOTE — Patient Instructions (Signed)
Proceed to hospital for evaluation of congestive heart failure

## 2013-06-09 NOTE — Progress Notes (Signed)
   Subjective:    Patient ID: [name], female    DOB: 1922-01-16, 80 y.o.   MRN: [id number]  HPI Complaint of one-week history of shortness of breath. Denies any recent acute URI symptoms. Has been using Advair inhaler but doesn't think it's working. Says Dr. Lovena Le increased her metoprolol and she felt much better over the last several weeks until this past week. Short of breath going to bathroom. No cough or congestion. No fever or shaking chills. She is on chronic Coumadin therapy. Looks to be short of breath with walking short distance from waiting room to exam room.    Review of Systems     Objective:   Physical Exam pulse oximetry is 96% on room air. TMs and pharynx are clear. Neck is supple without thyromegaly Chest clear. No thyromegaly. She is tachycardic and in atrial fibrillation. Chest x-ray shows bilateral pleural effusions.        Assessment & Plan:  Atrial fibrillation with rapid ventricular response  Congestive heart failure  Dyspnea  Plan: Continue Advair inhaler. Chest x-ray to be obtained, BNP, C. met and CBC drawn. TSH was checked in December.  Addendum: Chest x-ray shows bilateral pleural effusions. Suspect arrhythmia with rapid ventricular response is causing congestive heart failure. She is also mildly anemic. Hemoglobin is 10.8 g. Will order anemia studies. Called patient. Advise that she proceed to hospital for further evaluation.

## 2013-06-09 NOTE — ED Notes (Signed)
Pt here from PCP for eval of increased SOB and irregular HR; pt sts told has "fluid in lungs"; pt denies pain

## 2013-06-09 NOTE — ED Notes (Signed)
Dr Pickering at bedside 

## 2013-06-09 NOTE — H&P (Signed)
Patient ID: [name] MRN: [id number], DOB/AGE: [birth year]   Admit date: 06/09/2013   Primary Physician: Elby Showers, MD Primary Cardiologist: Dr Percival Spanish Dr Lovena Le  HPI: Pleasant 80 y/o female with a history of atrial fibrillation. She had been in NSR on low dose Amiodarone in the past but apparently there was a question of this causing worsening of her macular degeneration and the pt stopped it. She has been in AF with CVR on past few office visits (she saw Dr Lovena Le 05/13/13). She had a MDT pacemaker implanted in Oct 2005 and Dr Lovena Le is following.          She presented to Dr Verlene Mayer office this am with complaints of dyspnea for one week. She denies cough, fever, or orthopnea. Dr Renold Genta felt her HR was fast and was concerned she [place] have CHF. She is in the ER now for further evaluation. The pt's HR is 100-110, temp 99, pro BNP 8505. CXR suggest CHF vs pneumonia.    Problem List: Past Medical History  Diagnosis Date  . Symptomatic sinus bradycardia     s/p pacemaker  . Atrial flutter     s/p RFCA  . Atrial fibrillation   . Hypothyroidism   . GERD (gastroesophageal reflux disease)   . Esophageal stricture   . Anemia   . HTN (hypertension)     a.  echo 3/06: EF 55-65%, mild LVH, mild RVH  . Asthma   . Tremor, essential   . Diverticulosis   . Vertigo   . Urinary, incontinence, stress female   . DJD (degenerative joint disease)     knees  . Renal insufficiency   . Hearing loss   . Macular degeneration     Past Surgical History  Procedure Laterality Date  . Pacemaker insertion    . Radiofrequency catheter ablation       Allergies: No Known Allergies   Home Medications Current Facility-Administered Medications  Medication Dose Route Frequency Provider Last Rate Last Dose  . TDaP (BOOSTRIX) injection 0.5 mL  0.5 mL Intramuscular Once Elby Showers, MD       Current Outpatient Prescriptions  Medication Sig Dispense Refill  . ALPRAZolam (XANAX) 0.5 MG  tablet TAKE 1 TABLET BY MOUTH 3 TIMES A DAY AS NEEDED  90 tablet  5  . Cholecalciferol (VITAMIN D3) 1000 UNITS CAPS Take 1 capsule by mouth daily.        . cycloSPORINE (RESTASIS) 0.05 % ophthalmic emulsion 1 drop 2 (two) times daily.        Marland Kitchen esomeprazole (NEXIUM) 40 MG capsule Take 1 capsule (40 mg total) by mouth daily before breakfast.  90 capsule  3  . ferrous sulfate 325 (65 FE) MG tablet Take 325 mg by mouth daily with breakfast.        . Fluticasone-Salmeterol (ADVAIR DISKUS) 250-50 MCG/DOSE AEPB Inhale 1 puff into the lungs every 12 (twelve) hours.  60 each  5  . levothyroxine (SYNTHROID, LEVOTHROID) 88 MCG tablet TAKE 1 TABLET EVERY DAY  90 tablet  1  . metoprolol (LOPRESSOR) 50 MG tablet Take 1 tablet (50 mg total) by mouth 2 (two) times daily.  180 tablet  3  . metroNIDAZOLE (METROGEL) 1 % gel Apply 1 application topically daily.  45 g  6  . Multiple Vitamins-Minerals (ICAPS) CAPS Take 2 capsules by mouth daily.       . psyllium (METAMUCIL) 58.6 % powder Take 1 packet by mouth daily.        Marland Kitchen  triamterene-hydrochlorothiazide (MAXZIDE-25) 37.5-25 MG per tablet TAKE 1 TABLET EVERY DAY  90 tablet  3  . warfarin (COUMADIN) 4 MG tablet Take as directed by coumadin clinic         Family History  Problem Relation Age of Onset  . Cancer Mother     breast  . Cancer Father     leukemia  . Cirrhosis Brother     deceased     History   Social History  . Marital Status: Widowed    Spouse Name: N/A    Number of Children: 2  . Years of Education: N/A   Occupational History  . 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 Smoking status: Former Smoker    Types: Cigarettes    Quit date: 02/04/1962  . Smokeless tobacco: Never Used  . Alcohol Use: Yes     Comment: rarely  . Drug Use: No  . Sexual Activity: Not on file   Other Topics Concern  . Not on file   Social History Narrative  . No narrative on file     Review of Systems: General: negative for chills, fever,  night sweats or weight changes.  Cardiovascular: negative for chest pain, dyspnea on exertion, edema, orthopnea, palpitations, paroxysmal nocturnal dyspnea or shortness of breath Dermatological: negative for rash Respiratory: negative for cough or wheezing Urologic: negative for hematuria Abdominal: negative for nausea, vomiting, diarrhea, bright red blood per rectum, melena, or hematemesis Neurologic: negative for visual changes, syncope, or dizziness All other systems reviewed and are otherwise negative except as noted above.  Physical Exam: Blood pressure 113/59, pulse 107, temperature 98.1 F (36.7 C), temperature source Oral, resp. rate 20, SpO2 92.00%.  General appearance: alert, cooperative and no distress Neck: no carotid bruit and no JVD Lungs: clear to auscultation bilaterally Heart: irregularly irregular rhythm Abdomen: soft, non-tender; bowel sounds normal; no masses,  no organomegaly Extremities: extremities normal, atraumatic, no cyanosis or edema Pulses: 2+ and symmetric Skin: Skin color, texture, turgor normal. No rashes or lesions Neurologic: Grossly normal    Labs:   Results for orders placed during the hospital encounter of 06/09/13 (from the past 24 hour(s))  PRO B NATRIURETIC PEPTIDE     Status: Abnormal   Collection Time    06/09/13  3:51 PM      Result Value Ref Range   Pro B Natriuretic peptide (BNP) 8505.0 (*) 0 - 450 pg/mL  PROTIME-INR     Status: Abnormal   Collection Time    06/09/13  3:51 PM      Result Value Ref Range   Prothrombin Time 25.4 (*) 11.6 - 15.2 seconds   INR 2.40 (*) 0.00 - 1.49     Radiology/Studies: Dg Chest 2 View  06/09/2013   CLINICAL DATA:  Two week history of dyspnea and bronchitis symptoms  EXAM: CHEST  2 VIEW  COMPARISON:  DG CHEST 2 VIEW dated 11/12/2012  FINDINGS: Since the previous study dating patient has developed bilateral pleural effusions and increased density in both lung bases. The cardiac silhouette is not enlarged.  The pulmonary vascularity exhibits no cephalization but the perihilar interstitial markings are increased suggesting mild central pulmonary vascular congestion. The permanent pacemaker is unchanged in appearance. Mild degenerative disc change at multiple thoracic levels is present.  IMPRESSION: The findings are consistent with bibasilar atelectasis or pneumonia with small bilateral pleural effusions. Low-grade CHF Aul be present as well. There is underlying COPD.   Electronically Signed  By: David  Martinique   On: 06/09/2013 13:34    EKG: AF with VR 110, poor anterior RW and septal Qs  ASSESSMENT AND PLAN:  Principal Problem:   Dyspnea on exertion Active Problems:   Acute diastolic congestive heart failure   Chronic atrial fibrillation   CARDIAC PACEMAKER IN SITU- MDT 2005   Chronic renal insufficiency, stage III (moderate)   Hypertensive cardiovascular disease   HYPERTENSION   GERD   Long term current use of anticoagulant   Hypothyroidism   Macular degeneration   PLAN: Admit, one dose of IV Lasix 40 mg IV. Rocephin for possible CAP.    Henri Medal, PA-C 06/09/2013, 5:16 PM

## 2013-06-09 NOTE — ED Notes (Signed)
Cardiology at bedside.

## 2013-06-09 NOTE — H&P (Signed)
Pt. Seen and examined. Agree with the NP/PA-C note as written.  Pleasant 80 yo female with history of permanent a-fib and pacemaker, who presents with progressive dyspnea over the past week that has continued to worsen. She was seen in her PCP's office today and noted to be in RVR. CXR showed bilateral small pleural effusions. BNP was >600.  She was sent to the ER for CHF. ProBNP her is 8505. On exam, she has dullness at the bases, but no rales. I did not appreciate markedly elevated JVP. There is trace ankle edema. Rate is now around 100. Low grade temp of 4F. No chest pain.  Impression: 1.  Acute congestive heart failure, unknown if systolic or diastolic (EF last assessed in 2006) 2.  Possible pneumonia - no productive cough or increase in mucous, stable WBC's 3.  Permanent a-fib - rate controlled 4.  Pacemaker - not dependent, adequate battery life  Recommend: 1.  Admit for CHF exacerbation - recommend lasix 40 IV BID - will need to watch creatinine closely 2.  Empirically treat for possible CAP - Rochephin 1G q24 hours. 3.  Re-check 2D echocardiogram  Pixie Casino, MD, [facility] Attending Cardiologist [place]

## 2013-06-09 NOTE — ED Notes (Signed)
Contacted lab to add-on Troponin I

## 2013-06-09 NOTE — ED Provider Notes (Signed)
CSN: [id number]     Arrival date & time 06/09/13  1537 History   First MD Initiated Contact with Patient 06/09/13 1600     Chief Complaint  Patient presents with  . Shortness of Breath     (Consider location/radiation/quality/duration/timing/severity/associated sxs/prior Treatment) Patient is a 80 y.o. female presenting with shortness of breath. The history is provided by the patient.  Shortness of Breath Severity:  Moderate Associated symptoms: no abdominal pain, no chest pain, no headaches, no rash and no vomiting    patient's had shortness of breath over the last week. She gets seen in her primary care Dr. today and was told she had congestive heart failure from atrial for ablation with RVR. She's had no chest pain. He she has a pacemaker. She states she had seen Dr. Lovena Le around a month ago and was started on a larger dose of metoprolol. She been doing well, in fact improved up until a week ago. No chest pain. No cough. She denies swelling or legs or weight increase. Past Medical History  Diagnosis Date  . Symptomatic sinus bradycardia     s/p pacemaker  . Atrial flutter     s/p RFCA  . Atrial fibrillation   . Hypothyroidism   . GERD (gastroesophageal reflux disease)   . Esophageal stricture   . Anemia   . HTN (hypertension)     a.  echo 3/06: EF 55-65%, mild LVH, mild RVH  . Asthma   . Tremor, essential   . Diverticulosis   . Vertigo   . Urinary, incontinence, stress female   . DJD (degenerative joint disease)     knees  . Renal insufficiency   . Hearing loss   . Macular degeneration    Past Surgical History  Procedure Laterality Date  . Pacemaker insertion    . Radiofrequency catheter ablation     Family History  Problem Relation Age of Onset  . Cancer Mother     breast  . Cancer Father     leukemia  . Cirrhosis Brother     deceased   History  Substance Use Topics  . Smoking status: Former Smoker    Types: Cigarettes    Quit date: 02/04/1962  .  Smokeless tobacco: Never Used  . Alcohol Use: Yes     Comment: rarely   OB History   Grav Para Term Preterm Abortions TAB SAB Ect Mult Living                 Review of Systems  Constitutional: Negative for activity change and appetite change.  Eyes: Negative for pain.  Respiratory: Positive for shortness of breath. Negative for chest tightness.   Cardiovascular: Negative for chest pain and leg swelling.  Gastrointestinal: Negative for nausea, vomiting, abdominal pain and diarrhea.  Genitourinary: Negative for flank pain.  Musculoskeletal: Negative for back pain and neck stiffness.  Skin: Negative for rash.  Neurological: Negative for weakness, numbness and headaches.  Psychiatric/Behavioral: Negative for behavioral problems.      Allergies  Review of patient's allergies indicates no known allergies.  Home Medications   No current outpatient prescriptions on file. BP 147/79  Pulse 71  Temp(Src) 97.9 F (36.6 C) (Oral)  Resp 20  Ht 5\' 5"  (1.651 m)  Wt 142 lb 13.7 oz (64.8 kg)  BMI 23.77 kg/m2  SpO2 96% Physical Exam  Nursing note and vitals reviewed. Constitutional: She is oriented to person, place, and time. She appears well-developed and well-nourished.  HENT:  Head:  Normocephalic and atraumatic.  Eyes: EOM are normal. Pupils are equal, round, and reactive to light.  Neck: Normal range of motion. Neck supple.  Cardiovascular: Normal heart sounds.   No murmur heard. Irregular rhythm with mild tachycardia  Pulmonary/Chest: She has no rales.  Few scattered rales at bases.  Abdominal: Soft. Bowel sounds are normal. She exhibits no distension. There is no tenderness. There is no rebound and no guarding.  Musculoskeletal: Normal range of motion. She exhibits edema.  Mild bilateral lower extremity pitting edema.  Neurological: She is alert and oriented to person, place, and time. No cranial nerve deficit.  Skin: Skin is warm and dry.  Psychiatric: She has a normal  mood and affect. Her speech is normal.    ED Course  Procedures (including critical care time) Labs Review Labs Reviewed  PRO B NATRIURETIC PEPTIDE - Abnormal; Notable for the following:    Pro B Natriuretic peptide (BNP) 8505.0 (*)    All other components within normal limits  PROTIME-INR - Abnormal; Notable for the following:    Prothrombin Time 25.4 (*)    INR 2.40 (*)    All other components within normal limits  TROPONIN I  BASIC METABOLIC PANEL  CBC  TSH  PROTIME-INR   Imaging Review Dg Chest 2 View  06/09/2013   CLINICAL DATA:  Two week history of dyspnea and bronchitis symptoms  EXAM: CHEST  2 VIEW  COMPARISON:  DG CHEST 2 VIEW dated 11/12/2012  FINDINGS: Since the previous study dating patient has developed bilateral pleural effusions and increased density in both lung bases. The cardiac silhouette is not enlarged. The pulmonary vascularity exhibits no cephalization but the perihilar interstitial markings are increased suggesting mild central pulmonary vascular congestion. The permanent pacemaker is unchanged in appearance. Mild degenerative disc change at multiple thoracic levels is present.  IMPRESSION: The findings are consistent with bibasilar atelectasis or pneumonia with small bilateral pleural effusions. Low-grade CHF Hoen be present as well. There is underlying COPD.   Electronically Signed   By: David  Martinique   On: 06/09/2013 13:34     EKG Interpretation   Date/Time:  Monday June 09 2013 15:44:04 EDT Ventricular Rate:  102 PR Interval:    QRS Duration: 96 QT Interval:  336 QTC Calculation: 437 R Axis:   26 Text Interpretation:  Atrial flutter with variable A-V block Septal  infarct , age undetermined Abnormal ECG Confirmed by Shakila Mak  MD, Ovid Curd  [phone]) on 06/09/2013 4:08:37 PM      MDM   Final diagnoses:  Acute diastolic congestive heart failure  Atrial flutter    Patient with worsening shortness of breath. Sent in by her PCP with possible CHF. BNP is  elevated. She is in atrial flutter, with a mildly increased ventricular rate. X-ray shows possible pneumonia, however patient does not have an elevated white count fevers or cough. Will admit to cardiology.    Jasper Riling. Alvino Chapel, MD 06/09/13 [phone]

## 2013-06-10 DIAGNOSIS — I5031 Acute diastolic (congestive) heart failure: Principal | ICD-10-CM

## 2013-06-10 DIAGNOSIS — I509 Heart failure, unspecified: Secondary | ICD-10-CM | POA: Diagnosis not present

## 2013-06-10 DIAGNOSIS — I059 Rheumatic mitral valve disease, unspecified: Secondary | ICD-10-CM

## 2013-06-10 DIAGNOSIS — Z950 Presence of cardiac pacemaker: Secondary | ICD-10-CM | POA: Diagnosis not present

## 2013-06-10 HISTORY — PX: TRANSTHORACIC ECHOCARDIOGRAM: SHX275

## 2013-06-10 LAB — CBC
HCT: 28.7 % — ABNORMAL LOW (ref 36.0–46.0)
Hemoglobin: 10 g/dL — ABNORMAL LOW (ref 12.0–15.0)
MCH: 32.7 pg (ref 26.0–34.0)
MCHC: 34.8 g/dL (ref 30.0–36.0)
MCV: 93.8 fL (ref 78.0–100.0)
Platelets: 197 10*3/uL (ref 150–400)
RBC: 3.06 MIL/uL — ABNORMAL LOW (ref 3.87–5.11)
RDW: 13.1 % (ref 11.5–15.5)
WBC: 7.6 10*3/uL (ref 4.0–10.5)

## 2013-06-10 LAB — BASIC METABOLIC PANEL
BUN: 22 mg/dL (ref 6–23)
CO2: 23 mEq/L (ref 19–32)
Calcium: 8.7 mg/dL (ref 8.4–10.5)
Chloride: 100 mEq/L (ref 96–112)
Creatinine, Ser: 1.55 mg/dL — ABNORMAL HIGH (ref 0.50–1.10)
GFR calc Af Amer: 33 mL/min — ABNORMAL LOW (ref >90)
GFR calc non Af Amer: 29 mL/min — ABNORMAL LOW (ref >90)
Glucose, Bld: 104 mg/dL — ABNORMAL HIGH (ref 70–99)
Potassium: 4 mEq/L (ref 3.7–5.3)
Sodium: 136 mEq/L — ABNORMAL LOW (ref 137–147)

## 2013-06-10 LAB — PROTIME-INR
INR: 2.63 — ABNORMAL HIGH (ref 0.00–1.49)
Prothrombin Time: 27.2 seconds — ABNORMAL HIGH (ref 11.6–15.2)

## 2013-06-10 LAB — TSH: TSH: 0.542 u[IU]/mL (ref 0.350–4.500)

## 2013-06-10 MED ORDER — FUROSEMIDE 80 MG PO TABS
80.0000 mg | ORAL_TABLET | Freq: Every day | ORAL | Status: DC
  Administered 2013-06-11 – 2013-06-12 (×2): 80 mg via ORAL
  Filled 2013-06-10 (×3): qty 1

## 2013-06-10 MED ORDER — FUROSEMIDE 10 MG/ML IJ SOLN
40.0000 mg | Freq: Once | INTRAMUSCULAR | Status: AC
  Administered 2013-06-10: 40 mg via INTRAVENOUS
  Filled 2013-06-10: qty 4

## 2013-06-10 MED ORDER — AZITHROMYCIN 500 MG PO TABS
500.0000 mg | ORAL_TABLET | Freq: Every day | ORAL | Status: AC
  Administered 2013-06-10 – 2013-06-16 (×7): 500 mg via ORAL
  Filled 2013-06-10 (×7): qty 1

## 2013-06-10 NOTE — Progress Notes (Signed)
ANTICOAGULATION CONSULT NOTE - Follow Up Consult  Pharmacy Consult:  Coumadin Indication: atrial fibrillation  No Known Allergies  Patient Measurements: Height: 5\' 5"  (165.1 cm) Weight: 139 lb 1.6 oz (63.095 kg) (scale B) IBW/kg (Calculated) : 57  Vital Signs: Temp: 98.2 F (36.8 C) (03/10 0629) Temp src: Oral (03/10 0629) BP: 120/56 mmHg (03/10 0629) Pulse Rate: 78 (03/10 0629)  Labs:  Recent Labs  06/09/13 1225 06/09/13 1551 06/10/13 0404  HGB 10.8*  --  10.0*  HCT 32.4*  --  28.7*  PLT 204  --  197  LABPROT  --  25.4* 27.2*  INR  --  2.40* 2.63*  CREATININE 1.50*  --  1.55*  TROPONINI  --  <0.30  --     Estimated Creatinine Clearance: 22.1 ml/min (by C-G formula based on Cr of 1.55).     Assessment: 89 YOF on Coumadin from PTA for Afib.  INR therapeutic on home regimen.  No bleeding reported.   Goal of Therapy:  INR 2-3 Monitor platelets by anticoagulation protocol: Yes    Plan:  - Continue Coumadin 4mg  PO daily except 6mg  on Tue - Daily PT / INR for now, decrease frequency if INR remains stable    Latrina Guttman D. Mina Marble, PharmD, BCPS Pager:  [phone] 06/10/2013, 8:39 AM

## 2013-06-10 NOTE — Discharge Instructions (Addendum)
Lasix (fluid pill) dosing Take 20 mg of Lasix daily. You will need to adjust the dose, depending on your weight each morning. Follow the instructions below for help with dosing.  Sliding scale Lasix: Weigh yourself when you get home, then Daily in the Morning.  **Your dry weight will be what your scale says on the day you return home.(here is 135 lbs.).  If you gain more than 3 pounds from dry weight: Increase the Lasix dosing to 40 mg in the morning until weight returns to baseline dry weight.  If weight gain is greater than 5 pounds in 2 days: Increased to Lasix 40 mg in AM & 20 mg in PM and contact the office for further assistance if weight does not go down the next day. If the weight goes down more than 3 pounds from dry weight: Hold Lasix until it returns to baseline dry weight     Information on my medicine - Coumadin   (Warfarin)  This medication education was reviewed with me or my healthcare representative as part of my discharge preparation.  The pharmacist that spoke with me during my hospital stay was:  Saundra Shelling, [facility]  Why was Coumadin prescribed for you? Coumadin was prescribed for you because you have a blood clot or a medical condition that can cause an increased risk of forming blood clots. Blood clots can cause serious health problems by blocking the flow of blood to the heart, lung, or brain. Coumadin can prevent harmful blood clots from forming. As a reminder your indication for Coumadin is:   Stroke Prevention Because Of Atrial Fibrillation  What test will check on my response to Coumadin? While on Coumadin (warfarin) you will need to have an INR test regularly to ensure that your dose is keeping you in the desired range. The INR (international normalized ratio) number is calculated from the result of the laboratory test called prothrombin time (PT).  If an INR APPOINTMENT HAS NOT ALREADY BEEN MADE FOR YOU please schedule an appointment to have this lab work done  by your health care provider within 7 days. Your INR goal is usually a number between:  2 to 3 or your provider Bright give you a more narrow range like 2-2.5.  Ask your health care provider during an office visit what your goal INR is.  What  do you need to  know  About  COUMADIN? Take Coumadin (warfarin) exactly as prescribed by your healthcare provider about the same time each day.  DO NOT stop taking without talking to the doctor who prescribed the medication.  Stopping without other blood clot prevention medication to take the place of Coumadin [place] increase your risk of developing a new clot or stroke.  Get refills before you run out.  What do you do if you miss a dose? If you miss a dose, take it as soon as you remember on the same day then continue your regularly scheduled regimen the next day.  Do not take two doses of Coumadin at the same time.  Important Safety Information A possible side effect of Coumadin (Warfarin) is an increased risk of bleeding. You should call your healthcare provider right away if you experience any of the following:   Bleeding from an injury or your nose that does not stop.   Unusual colored urine (red or dark brown) or unusual colored stools (red or black).   Unusual bruising for unknown reasons.   A serious fall or if you hit  your head (even if there is no bleeding).  Some foods or medicines interact with Coumadin (warfarin) and might alter your response to warfarin. To help avoid this:   Eat a balanced diet, maintaining a consistent amount of Vitamin K.   Notify your provider about major diet changes you plan to make.   Avoid alcohol or limit your intake to 1 drink for women and 2 drinks for men per day. (1 drink is 5 oz. wine, 12 oz. beer, or 1.5 oz. liquor.)  Make sure that ANY health care provider who prescribes medication for you knows that you are taking Coumadin (warfarin).  Also make sure the healthcare provider who is monitoring your Coumadin knows  when you have started a new medication including herbals and non-prescription products.  Coumadin (Warfarin)  Major Drug Interactions  Increased Warfarin Effect Decreased Warfarin Effect  Alcohol (large quantities) Antibiotics (esp. Septra/Bactrim, Flagyl, Cipro) Amiodarone (Cordarone) Aspirin (ASA) Cimetidine (Tagamet) Megestrol (Megace) NSAIDs (ibuprofen, naproxen, etc.) Piroxicam (Feldene) Propafenone (Rythmol SR) Propranolol (Inderal) Isoniazid (INH) Posaconazole (Noxafil) Barbiturates (Phenobarbital) Carbamazepine (Tegretol) Chlordiazepoxide (Librium) Cholestyramine (Questran) Griseofulvin Oral Contraceptives Rifampin Sucralfate (Carafate) Vitamin K   Coumadin (Warfarin) Major Herbal Interactions  Increased Warfarin Effect Decreased Warfarin Effect  Garlic Ginseng Ginkgo biloba Coenzyme Q10 Green tea St. Johns wort    Coumadin (Warfarin) FOOD Interactions  Eat a consistent number of servings per week of foods HIGH in Vitamin K (1 serving =  cup)  Collards (cooked, or boiled & drained) Kale (cooked, or boiled & drained) Mustard greens (cooked, or boiled & drained) Parsley *serving size only =  cup Spinach (cooked, or boiled & drained) Swiss chard (cooked, or boiled & drained) Turnip greens (cooked, or boiled & drained)  Eat a consistent number of servings per week of foods MEDIUM-HIGH in Vitamin K (1 serving = 1 cup)  Asparagus (cooked, or boiled & drained) Broccoli (cooked, boiled & drained, or raw & chopped) Brussel sprouts (cooked, or boiled & drained) *serving size only =  cup Lettuce, raw (green leaf, endive, romaine) Spinach, raw Turnip greens, raw & chopped   These websites have more information on Coumadin (warfarin):  FailFactory.se; VeganReport.com.au;

## 2013-06-10 NOTE — Progress Notes (Signed)
Echocardiogram 2D Echocardiogram has been performed.  [name] 06/10/2013, 11:49 AM

## 2013-06-10 NOTE — Care Management Note (Addendum)
    Page 1 of 2   06/16/2013     11:51:19 AM   CARE MANAGEMENT NOTE 06/16/2013  Patient:  [name], [name]   Account Number:  [id number]  Date Initiated:  06/10/2013  Documentation initiated by:  [facility]  Subjective/Objective Assessment:   80 yo female PMHx of permanent a-fib and pacemaker, who presents with progressive dyspnea over the past week that has continued to worsen. She was seen in her PCP's office today and noted to be in RVR//Home alone     Action/Plan:   1.  Admit for CHF exacerbation - recommend lasix 40 IV BID - will need to watch creatinine closely  2.  Empirically treat for possible CAP - Rochephin 1G q24 hours.  3.  Re-check 2D echocardiogram//Access for [facility] services   Anticipated DC Date:  06/14/2013   Anticipated DC Plan:  [place]  CM consult      [facility] Choice  HOME HEALTH   Choice offered to / List presented to:  C-1 Patient        [place] arranged  HH-1 RN  [place] PT      [place].   Status of service:  In process, will continue to follow Medicare Important Message given?   (If response is "NO", the following Medicare IM given date fields will be blank) Date Medicare IM given:   Date Additional Medicare IM given:    Discharge Disposition:    Per UR Regulation:  Reviewed for med. necessity/level of care/duration of stay  If discussed at [place] of Stay Meetings, dates discussed:   06/17/2013    Comments:  06/16/13 [place], RN, BSN, NCM [phone] HHPT added to [facility] services.  Lelan Pons of [facility] notified.  06/11/13 [place], RN, BSN, [employer] [phone] Spoke with pt at bedside regarding discharge planning for [facility]. Offered pt list of home health agencies to choose from.  Pt chose [facility] to render services. Janae Sauce, RN of [facility] notified.  No DME needs identified at this time

## 2013-06-10 NOTE — Evaluation (Signed)
Physical Therapy Evaluation Patient Details Name: [name] MRN: [id number] DOB: June 26, 1921 Today's Date: 06/10/2013 Time: 1200-1223 PT Time Calculation (min): 23 min  PT Assessment / Plan / Recommendation History of Present Illness  Pleasant 80 y/o female with a history of atrial fibrillation. She had been in NSR on low dose Amiodarone in the past but apparently there was a question of this causing worsening of her macular degeneration and the pt stopped it. She has been in AF with CVR on past few office visits (she saw Dr Lovena Le 05/13/13). She had a MDT pacemaker implanted in Oct 2005 and Dr Lovena Le is following. CHF  Clinical Impression  Pt mobilizing well with sats 88-94% on RA with all activity with increased saturation with standing rest with pursed lip breathing. Slow steady gait and benefitted from use of RW today. Pt with decreased activity tolerance and below deficits who will benefit from acute therapy to maximize mobility, function and independence for safe return home. Recommend daily mobility with nursing. Incentive spirometer Knecht also be highly beneficial.    PT Assessment  Patient needs continued PT services    Follow Up Recommendations  No PT follow up    Does the patient have the potential to tolerate intense rehabilitation      Barriers to Discharge Decreased caregiver support      Equipment Recommendations  None recommended by PT    Recommendations for Other Services     Frequency Min 3X/week    Precautions / Restrictions Precautions Precautions: Fall Precaution Comments: watch sats   Pertinent Vitals/Pain No pain HR 89-111      Mobility  Bed Mobility Overal bed mobility: Modified Independent Transfers Overall transfer level: Modified independent Ambulation/Gait Ambulation/Gait assistance: Supervision Ambulation Distance (Feet): 500 Feet Assistive device: Rolling walker (2 wheeled) Gait Pattern/deviations: Decreased stride length Gait velocity  interpretation: <1.8 ft/sec, indicative of risk for recurrent falls General Gait Details: cues for breathing technique, position in RW and posture as well as need for standing rest grossly every 25' to bring O2 sat back into the 90's pt drops to 88% with ambulation but quickly recovers with standing    Exercises     PT Diagnosis: Difficulty walking  PT Problem List: Decreased activity tolerance;Cardiopulmonary status limiting activity;Decreased knowledge of use of DME PT Treatment Interventions: Gait training;Stair training;Functional mobility training;Therapeutic activities;Therapeutic exercise;Patient/family education;DME instruction     PT Goals(Current goals can be found in the care plan section) Acute Rehab PT Goals Patient Stated Goal: return home PT Goal Formulation: With patient/family Time For Goal Achievement: 06/17/13 Potential to Achieve Goals: Good  Visit Information  Last PT Received On: 06/10/13 Assistance Needed: +1 History of Present Illness: Pleasant 80 y/o female with a history of atrial fibrillation. She had been in NSR on low dose Amiodarone in the past but apparently there was a question of this causing worsening of her macular degeneration and the pt stopped it. She has been in AF with CVR on past few office visits (she saw Dr Lovena Le 05/13/13). She had a MDT pacemaker implanted in Oct 2005 and Dr Lovena Le is following. CHF       Prior Functioning  Home Living Family/patient expects to be discharged to:: Private residence Living Arrangements: Alone Available Help at Discharge: Family;Available PRN/intermittently Type of Home: House Home Access: Stairs to enter [employer] of Steps: 5 Entrance Stairs-Rails: Right Home Layout: One level Home Equipment: Cane - single point;Walker - 2 wheels;Shower seat - built in Prior Function Level of Independence:  Independent with assistive device(s) Communication Communication: No difficulties    Cognition   Cognition Arousal/Alertness: Awake/alert Behavior During Therapy: WFL for tasks assessed/performed Overall Cognitive Status: Within Functional Limits for tasks assessed    Extremity/Trunk Assessment Upper Extremity Assessment Upper Extremity Assessment: Generalized weakness Lower Extremity Assessment Lower Extremity Assessment: Generalized weakness Cervical / Trunk Assessment Cervical / Trunk Assessment: Normal   Balance    End of Session PT - End of Session Activity Tolerance: Patient tolerated treatment well Patient left: in chair;with call bell/phone within reach;with family/visitor present Nurse Communication: Mobility status  GP     Melford Aase 06/10/2013, 12:34 PM Elwyn Reach, [place]

## 2013-06-10 NOTE — Progress Notes (Signed)
Subjective:  Pleasant 80 y/o female with a history of atrial fibrillation. She had been in NSR on low dose Amiodarone in the past but apparently there was a question of this causing worsening of her macular degeneration and the pt stopped it. She has been in AF with CVR on past few office visits (she saw Dr Lovena Le 05/13/13). She had a MDT pacemaker implanted in Oct 2005 and Dr Lovena Le is following.  She presented to Dr Verlene Mayer office this am with complaints of dyspnea for one week. She denies cough, fever, or orthopnea. Dr Renold Genta felt her HR was fast and was concerned she [place] have CHF. She is in the ER now for further evaluation. The pt's HR is 100-110, temp 99, pro BNP 8505. CXR suggest CHF vs pneumonia.   She states her breathing is improved, but she continues on 2L [place]. She has not been out of bed to ambulate yet. Denies CP, palpitations, dizziness.   Echo has been ordered but results are not back yet I/O is -2.5 liters yesterday   Objective: Vital signs in last 24 hours: Temp:  [97.9 F (36.6 C)-99.2 F (37.3 C)] 98.2 F (36.8 C) (03/10 0629) Pulse Rate:  [71-118] 78 (03/10 0629) Resp:  [16-26] 18 (03/10 0629) BP: (92-157)/(55-95) 120/56 mmHg (03/10 0629) SpO2:  [92 %-100 %] 98 % (03/10 0932) Weight:  [139 lb 1.6 oz (63.095 kg)-148 lb 8 oz (67.359 kg)] 139 lb 1.6 oz (63.095 kg) (03/10 0629) Last BM Date: 06/09/13  Intake/Output from previous day: 03/09 0701 - 03/10 0700 In: 300 [P.O.:300] Out: 2850 [Urine:2850] Intake/Output this shift: Total I/O In: 240 [P.O.:240] Out: 125 [Urine:125]  Medications Current Facility-Administered Medications  Medication Dose Route Frequency Provider Last Rate Last Dose  . acetaminophen (TYLENOL) tablet 650 mg  650 mg Oral Q4H PRN Erlene Quan, PA-C      . ALPRAZolam Duanne Moron) tablet 0.25 mg  0.25 mg Oral TID PRN Erlene Quan, PA-C   0.25 mg at 06/10/13 0014  . budesonide-formoterol (SYMBICORT) 80-4.5 MCG/ACT inhaler 2 puff  2 puff  Inhalation BID Pixie Casino, MD   2 puff at 06/10/13 [phone]  . cefTRIAXone (ROCEPHIN) 1 g in dextrose 5 % 50 mL IVPB  1 g Intravenous Q24H Erlene Quan, PA-C   1 g at 06/09/13 1741  . cycloSPORINE (RESTASIS) 0.05 % ophthalmic emulsion 1 drop  1 drop Both Eyes BID Erlene Quan, PA-C   1 drop at 06/10/13 0630  . ferrous sulfate tablet 325 mg  325 mg Oral Q breakfast Erlene Quan, PA-C   325 mg at 06/10/13 0741  . levothyroxine (SYNTHROID, LEVOTHROID) tablet 88 mcg  88 mcg Oral QPM Erlene Quan, PA-C   88 mcg at 06/09/13 2355  . metoprolol tartrate (LOPRESSOR) tablet 50 mg  50 mg Oral BID Erlene Quan, PA-C   50 mg at 06/10/13 1601  . ondansetron (ZOFRAN) injection 4 mg  4 mg Intravenous Q6H PRN Doreene Burke Kilroy, PA-C      . pantoprazole (PROTONIX) EC tablet 80 mg  80 mg Oral Q1200 Luke K Kilroy, PA-C      . psyllium (HYDROCIL/METAMUCIL) packet 1 packet  1 packet Oral Daily Erlene Quan, [place]   1 packet at 06/10/13 [phone]  . [START ON 06/11/2013] warfarin (COUMADIN) tablet 4 mg  4 mg Oral Once per day on Sun Mon Wed Thu Fri Sat Anh P Pham, RPH      . warfarin (COUMADIN)  tablet 6 mg  6 mg Oral Q Tue-1800 Anh P Pham, RPH      . Warfarin - Pharmacist Dosing Inpatient   Does not apply q1800 Anh Carlynn Purl, RPH        PE: General appearance: alert, cooperative and no distress Lungs: clear to auscultation bilaterally Heart: irregularly irregular rhythm Extremities: no LEE Pulses: 2+ and symmetric Skin: warm and dry Neurologic: Grossly normal  Lab Results:   Recent Labs  06/09/13 1225 06/10/13 0404  WBC 8.8 7.6  HGB 10.8* 10.0*  HCT 32.4* 28.7*  PLT 204 197   BMET  Recent Labs  06/09/13 1225 06/10/13 0404  NA 138 136*  K 4.5 4.0  CL 104 100  CO2 24 23  GLUCOSE 114* 104*  BUN 22 22  CREATININE 1.50* 1.55*  CALCIUM 8.8 8.7   PT/INR  Recent Labs  06/09/13 1551 06/10/13 0404  LABPROT 25.4* 27.2*  INR 2.40* 2.63*   Cardiac Panel (last 3 results)  Recent Labs   06/09/13 1551  TROPONINI <0.30    Studies/Results:  2D echo 06/09/13 - result pending.    Filed Weights   06/09/13 1853 06/10/13 0629  Weight: 142 lb 13.7 oz (64.8 kg) 139 lb 1.6 oz (63.095 kg)   BNP (last 3 results)  Recent Labs  06/09/13 1551  PROBNP 8505.0*   Assessment/Plan  Principal Problem:   Dyspnea on exertion Active Problems:   HYPERTENSION   Chronic atrial fibrillation   GERD   CARDIAC PACEMAKER IN SITU- MDT 2005   Long term current use of anticoagulant   Hypothyroidism   Chronic renal insufficiency, stage III (moderate)   Acute diastolic congestive heart failure   Hypertensive cardiovascular disease   Macular degeneration  Plan: Feeling slightly better, but continues to use 2L [place]. She is unsure of continued DOE because she has not been out of bed yet. Recommend having patient ambulate with RN this afternoon while checking O2 sats to see if there is any improvement. She has had good diuresis, - 2.5 L in first 24 hrs. Weight is down 3lbs since yesterday. Continue IV Lasix for now. BP is stable. Scr slightly increased from 1.50 to 1.55. K+ is WNL. 2D echo results pending. Also continue daily weights and strict I/Os. BNP was elevated on admit at [place]. Will recheck in the am. Chronic A fib is decently controlled w/ rates in the 80s-90s. Continue on 50 mg Lopressor BID.  INR is therapeutic at 2.63. Continue dosing per pharmacy. MD to follow.    LOS: 1 day   Brittainy M. Ladoris Gene 06/10/2013 11:09 AM  Attending Note:   The patient was seen and examined.  Agree with assessment and plan as noted above.  Changes made to the above note as needed.  1. Congestive heart failure:   She has never had congestive heart failure in the past. She admits to eating lots of salt and her daughter who was with her today agreed with that assessment. The echocardiogram is pending. We will continue with the current dose of Lasix.  2. Atrial fibrillation:-Status post pacemaker  implantation  3. Hypertension:  Continue with medical therapy.  4. Dyspnea: She was started on antibiotics for possible pneumonia.   Thayer Headings, Brooke Bonito., MD, [facility] 06/10/2013, 2:40 PM

## 2013-06-10 NOTE — Progress Notes (Signed)
Utilization Review Completed Kenna Seward J. Jamelia Varano, RN, BSN, NCM [phone]  

## 2013-06-11 DIAGNOSIS — I509 Heart failure, unspecified: Secondary | ICD-10-CM | POA: Diagnosis not present

## 2013-06-11 DIAGNOSIS — I5031 Acute diastolic (congestive) heart failure: Secondary | ICD-10-CM | POA: Diagnosis not present

## 2013-06-11 DIAGNOSIS — I4891 Unspecified atrial fibrillation: Secondary | ICD-10-CM | POA: Diagnosis not present

## 2013-06-11 LAB — BASIC METABOLIC PANEL
BUN: 26 mg/dL — ABNORMAL HIGH (ref 6–23)
CHLORIDE: 98 meq/L (ref 96–112)
CO2: 24 meq/L (ref 19–32)
CREATININE: 1.66 mg/dL — AB (ref 0.50–1.10)
Calcium: 9 mg/dL (ref 8.4–10.5)
GFR calc Af Amer: 30 mL/min — ABNORMAL LOW (ref >90)
GFR calc non Af Amer: 26 mL/min — ABNORMAL LOW (ref >90)
Glucose, Bld: 98 mg/dL (ref 70–99)
Potassium: 4 mEq/L (ref 3.7–5.3)
Sodium: 137 mEq/L (ref 137–147)

## 2013-06-11 LAB — PROTIME-INR
INR: 2.37 — AB (ref 0.00–1.49)
Prothrombin Time: 25.1 seconds — ABNORMAL HIGH (ref 11.6–15.2)

## 2013-06-11 NOTE — Progress Notes (Signed)
Subjective:  Pleasant 80 y/o female with a history of atrial fibrillation. She had been in NSR on low dose Amiodarone in the past but apparently there was a question of this causing worsening of her macular degeneration and the pt stopped it. She has been in AF with CVR on past few office visits (she saw Dr Lovena Le 05/13/13). She had a MDT pacemaker implanted in Oct 2005 and Dr Lovena Le is following.  She presented to Dr Verlene Mayer office this am with complaints of dyspnea for one week. She denies cough, fever, or orthopnea. Dr Renold Genta felt her HR was fast and was concerned she [place] have CHF. She is in the ER now for further evaluation. The pt's HR is 100-110, temp 99, pro BNP 8505. CXR suggest CHF vs pneumonia.   She states her breathing is improved, but she continues on 2L [place]. She has not been out of bed to ambulate yet. Denies CP, palpitations, dizziness.   Echo shows normal left ventricular systolic function. The ejection fraction is 50-50%. There was mild mitral regurgitation.  I/O is -2.5 liters yesterday   Objective: Vital signs in last 24 hours: Temp:  [97.4 F (36.3 C)-98.1 F (36.7 C)] 97.9 F (36.6 C) (03/11 0622) Pulse Rate:  [66-128] 66 (03/11 0622) Resp:  [18] 18 (03/11 0622) BP: (96-125)/(56-79) 124/65 mmHg (03/11 0622) SpO2:  [92 %-96 %] 93 % (03/11 0931) Weight:  [135 lb 12.8 oz (61.598 kg)] 135 lb 12.8 oz (61.598 kg) (03/11 0622) Last BM Date: 06/10/13  Intake/Output from previous day: 03/10 0701 - 03/11 0700 In: 660 [P.O.:660] Out: 1325 [Urine:1325] Intake/Output this shift: Total I/O In: 240 [P.O.:240] Out: -   Medications Current Facility-Administered Medications  Medication Dose Route Frequency Provider Last Rate Last Dose  . acetaminophen (TYLENOL) tablet 650 mg  650 mg Oral Q4H PRN Erlene Quan, PA-C      . ALPRAZolam Duanne Moron) tablet 0.25 mg  0.25 mg Oral TID PRN Erlene Quan, PA-C   0.25 mg at 06/10/13 2148  . azithromycin (ZITHROMAX) tablet 500 mg  500  mg Oral Daily Brittainy Simmons, PA-C   500 mg at 06/11/13 2440  . budesonide-formoterol (SYMBICORT) 80-4.5 MCG/ACT inhaler 2 puff  2 puff Inhalation BID Pixie Casino, MD   2 puff at 06/11/13 0931  . cefTRIAXone (ROCEPHIN) 1 g in dextrose 5 % 50 mL IVPB  1 g Intravenous Q24H Erlene Quan, PA-C 100 mL/hr at 06/10/13 1708 1 g at 06/10/13 1708  . cycloSPORINE (RESTASIS) 0.05 % ophthalmic emulsion 1 drop  1 drop Both Eyes BID Erlene Quan, PA-C   1 drop at 06/11/13 1027  . ferrous sulfate tablet 325 mg  325 mg Oral Q breakfast Erlene Quan, PA-C   325 mg at 06/11/13 0813  . furosemide (LASIX) tablet 80 mg  80 mg Oral Daily Thayer Headings, MD   80 mg at 06/11/13 2536  . levothyroxine (SYNTHROID, LEVOTHROID) tablet 88 mcg  88 mcg Oral QPM Doreene Burke Kilroy, PA-C   88 mcg at 06/10/13 1708  . metoprolol tartrate (LOPRESSOR) tablet 50 mg  50 mg Oral BID Erlene Quan, PA-C   50 mg at 06/11/13 6440  . ondansetron (ZOFRAN) injection 4 mg  4 mg Intravenous Q6H PRN Doreene Burke Kilroy, PA-C      . pantoprazole (PROTONIX) EC tablet 80 mg  80 mg Oral Q1200 Doreene Burke Kilroy, PA-C   80 mg at 06/10/13 1136  . psyllium (HYDROCIL/METAMUCIL) packet  1 packet  1 packet Oral Daily Erlene Quan, [place]   1 packet at 06/11/13 [phone]  . warfarin (COUMADIN) tablet 4 mg  4 mg Oral Once per day on Sun Mon Wed Thu Fri Sat Anh P Pham, RPH      . warfarin (COUMADIN) tablet 6 mg  6 mg Oral Q Tue-1800 Anh P Pham, RPH   6 mg at 06/10/13 1708  . Warfarin - Pharmacist Dosing Inpatient   Does not apply q1800 Anh P Pham, RPH        PE: General appearance: alert, cooperative and no distress Lungs: clear to auscultation bilaterally Heart: irregularly irregular rhythm Extremities: no LEE Pulses: 2+ and symmetric Skin: warm and dry Neurologic: Grossly normal  Lab Results:   Recent Labs  06/09/13 1225 06/10/13 0404  WBC 8.8 7.6  HGB 10.8* 10.0*  HCT 32.4* 28.7*  PLT 204 197   BMET  Recent Labs  06/09/13 1225 06/10/13 0404  06/11/13 0449  NA 138 136* 137  K 4.5 4.0 4.0  CL 104 100 98  CO2 24 23 24   GLUCOSE 114* 104* 98  BUN 22 22 26*  CREATININE 1.50* 1.55* 1.66*  CALCIUM 8.8 8.7 9.0   PT/INR  Recent Labs  06/09/13 1551 06/10/13 0404 06/11/13 0449  LABPROT 25.4* 27.2* 25.1*  INR 2.40* 2.63* 2.37*   Cardiac Panel (last 3 results)  Recent Labs  06/09/13 1551  TROPONINI <0.30    Studies/Results:  2D echo 06/09/13 - result pending.    Filed Weights   06/09/13 1853 06/10/13 0629 06/11/13 0622  Weight: 142 lb 13.7 oz (64.8 kg) 139 lb 1.6 oz (63.095 kg) 135 lb 12.8 oz (61.598 kg)   BNP (last 3 results)  Recent Labs  06/09/13 1551  PROBNP 8505.0*   Assessment/Plan  Principal Problem:   Dyspnea on exertion Active Problems:   HYPERTENSION   Chronic atrial fibrillation   GERD   CARDIAC PACEMAKER IN SITU- MDT 2005   Long term current use of anticoagulant   Hypothyroidism   Chronic renal insufficiency, stage III (moderate)   Acute diastolic congestive heart failure   Hypertensive cardiovascular disease   Macular degeneration  Plan: Feeling slightly better, but continues to use 2L [place]. She is unsure of continued DOE because she has not been out of bed yet. Recommend having patient ambulate with RN this afternoon while checking O2 sats to see if there is any improvement. She has had good diuresis, - 2.5 L in first 24 hrs. Weight is down 3lbs since yesterday. Continue IV Lasix for now. BP is stable. Scr slightly increased from 1.50 to 1.55. K+ is WNL. 2D echo results pending. Also continue daily weights and strict I/Os. BNP was elevated on admit at [place]. Will recheck in the am. Chronic A fib is decently controlled w/ rates in the 80s-90s. Continue on 50 mg Lopressor BID.  INR is therapeutic at 2.63. Continue dosing per pharmacy. MD to follow.    LOS: 2 days    1. Congestive heart failure:   She has never had congestive heart failure in the past. She admits to eating lots of salt and her  daughter who was with her today agreed with that assessment. The echocardiogram is pending. We will continue with the current dose of Lasix. She needs to ambulate.  Anticipate DC tomorrow.  2. Atrial fibrillation:-Status post pacemaker implantation  3. Hypertension:  Continue with medical therapy.  4. Dyspnea: She was started on antibiotics for possible pneumonia.  Will DC tomorrow.  No fever.  wBC is normal.    Ramond Dial., MD, [facility] 06/11/2013, 11:33 AM

## 2013-06-11 NOTE — Progress Notes (Signed)
Patient evaluated for community based chronic disease management services with [facility] as a benefit of patient's [employer]. Spoke with patient at bedside to explain [facility] services.  Services have been accepted and written consents obtained.  Patient lives alone and has a new diagnosis of questionable CHF.  Her daughter  Doran Clay is her HCPOA and emergency contact.  Her number is [phone].  Patient will receive a post discharge transition of care call and will be evaluated for monthly home visits for assessments and disease process education.  Left contact information and [facility] literature at bedside. Made Inpatient Case Manager aware that [facility] following. Of note, [facility] Care Management services does not replace or interfere with any services that are arranged by inpatient case management or social work.  For additional questions or referrals please contact Corliss Blacker BSN RN [facility] Liaison at [phone].

## 2013-06-11 NOTE — Progress Notes (Signed)
ANTICOAGULATION CONSULT - COUMADIN  Pharmacy Consult for : Coumadin  HPI/Indication : 80 y.o.female admitted Atrial flutter [427.32] COPD (chronic obstructive pulmonary disease) [496] Acute congestive heart failure [428.0] Dyspnea on exertion [094.70] Acute diastolic congestive heart failure [428.31, 428.0] who is on warfarin for atrial fibrillation   Allergies: No Known Allergies  Dosing weight : 61.6 kg  Problems: Principal Problem:   Dyspnea on exertion Active Problems:   HYPERTENSION   Chronic atrial fibrillation   GERD   CARDIAC PACEMAKER IN SITU- MDT 2005   Long term current use of anticoagulant   Hypothyroidism   Chronic renal insufficiency, stage III (moderate)   Acute diastolic congestive heart failure   Hypertensive cardiovascular disease   Macular degeneration   Medical / Surgical History: Diagnosis  . Symptomatic sinus bradycardia     . Atrial flutter     . Atrial fibrillation  . Hypothyroidism  . GERD (gastroesophageal reflux disease)  . Esophageal stricture  . Anemia  . HTN (hypertension)     . Asthma  . Tremor, essential  . Diverticulosis  . Vertigo  . Urinary, incontinence, stress female  . DJD (degenerative joint disease)     . Renal insufficiency  . Hearing loss  . Macular degeneration   Procedure  . Pacemaker insertion  . Radiofrequency catheter ablation    Labs:  Recent Labs  06/09/13 1225 06/09/13 1551 06/10/13 0404 06/11/13 0449  HGB 10.8*  --  10.0*  --   HCT 32.4*  --  28.7*  --   PLT 204  --  197  --   LABPROT  --  25.4* 27.2* 25.1*  INR  --  2.40* 2.63* 2.37*  CREATININE 1.50*  --  1.55* 1.66*   Estimated Creatinine Clearance: 20.7 ml/min (by C-G formula based on Cr of 1.66).  Current Medication[s] Include: Scheduled:  Scheduled:  . azithromycin  500 mg Oral Daily  . budesonide-formoterol  2 puff Inhalation BID  . cefTRIAXone (ROCEPHIN)  IV  1 g Intravenous Q24H  . cycloSPORINE  1 drop Both Eyes BID  .  ferrous sulfate  325 mg Oral Q breakfast  . furosemide  80 mg Oral Daily  . levothyroxine  88 mcg Oral QPM  . metoprolol  50 mg Oral BID  . pantoprazole  80 mg Oral Q1200  . psyllium  1 packet Oral Daily  . warfarin  4 mg Oral Once per day on Sun Mon Wed Thu Fri Sat  . warfarin  6 mg Oral Q Tue-1800  . Warfarin - Pharmacist Dosing Inpatient   Does not apply q1800   Antibiotic[s]: Anti-infectives   Start     Dose/Rate Route Frequency Ordered Stop   06/10/13 1600  azithromycin (ZITHROMAX) tablet 500 mg     500 mg Oral Daily 06/10/13 1449     06/09/13 1730  cefTRIAXone (ROCEPHIN) 1 g in dextrose 5 % 50 mL IVPB    Comments:  Then re evaluate   1 g 100 mL/hr over 30 Minutes Intravenous Every 24 hours 06/09/13 1728 06/12/13 1729      Assessment:  Patient remains therapeutic on her home dose of Coumadin.  INR  2.37.  No bleeding complications noted    Goals:  Target INR of 2-3.  Plan: 1. Will continue home Coumadin schedule as ordered.   2. Daily INR's, CBC.  Monitor for bleeding complications   Estelle June, Pharm. D. 06/11/2013, 2:04 PM

## 2013-06-12 ENCOUNTER — Encounter (HOSPITAL_COMMUNITY): Payer: Self-pay | Admitting: *Deleted

## 2013-06-12 DIAGNOSIS — I4891 Unspecified atrial fibrillation: Secondary | ICD-10-CM | POA: Diagnosis not present

## 2013-06-12 DIAGNOSIS — N184 Chronic kidney disease, stage 4 (severe): Secondary | ICD-10-CM | POA: Diagnosis not present

## 2013-06-12 DIAGNOSIS — I5031 Acute diastolic (congestive) heart failure: Secondary | ICD-10-CM | POA: Diagnosis not present

## 2013-06-12 DIAGNOSIS — Z950 Presence of cardiac pacemaker: Secondary | ICD-10-CM | POA: Diagnosis not present

## 2013-06-12 LAB — BASIC METABOLIC PANEL
BUN: 31 mg/dL — AB (ref 6–23)
CHLORIDE: 94 meq/L — AB (ref 96–112)
CO2: 26 mEq/L (ref 19–32)
Calcium: 8.8 mg/dL (ref 8.4–10.5)
Creatinine, Ser: 1.72 mg/dL — ABNORMAL HIGH (ref 0.50–1.10)
GFR calc non Af Amer: 25 mL/min — ABNORMAL LOW (ref >90)
GFR, EST AFRICAN AMERICAN: 29 mL/min — AB (ref >90)
Glucose, Bld: 98 mg/dL (ref 70–99)
Potassium: 3.4 mEq/L — ABNORMAL LOW (ref 3.7–5.3)
Sodium: 136 mEq/L — ABNORMAL LOW (ref 137–147)

## 2013-06-12 LAB — PROTIME-INR
INR: 1.84 — AB (ref 0.00–1.49)
PROTHROMBIN TIME: 20.7 s — AB (ref 11.6–15.2)

## 2013-06-12 MED ORDER — WARFARIN SODIUM 4 MG PO TABS
8.0000 mg | ORAL_TABLET | Freq: Once | ORAL | Status: AC
  Administered 2013-06-12: 8 mg via ORAL
  Filled 2013-06-12: qty 2

## 2013-06-12 MED ORDER — METOPROLOL TARTRATE 100 MG PO TABS
100.0000 mg | ORAL_TABLET | Freq: Two times a day (BID) | ORAL | Status: DC
  Administered 2013-06-12 – 2013-06-16 (×9): 100 mg via ORAL
  Filled 2013-06-12 (×10): qty 1

## 2013-06-12 NOTE — Progress Notes (Addendum)
Subjective:  Pleasant 80 y/o female with a history of atrial fibrillation. She had been in NSR on low dose Amiodarone in the past but apparently there was a question of this causing worsening of her macular degeneration and the pt stopped it. She has been in AF with CVR on past few office visits (she saw Dr Lovena Le 05/13/13). She had a MDT pacemaker implanted in Oct 2005 and Dr Lovena Le is following.  She presented to Dr Verlene Mayer office this am with complaints of dyspnea for one week. She denies cough, fever, or orthopnea. Dr Renold Genta felt her HR was fast and was concerned she [place] have CHF. She is in the ER now for further evaluation. The pt's HR is 100-110, temp 99, pro BNP 8505. CXR suggest CHF vs pneumonia.   She states her breathing is improved, but she continues on 2L [place]. She has not been out of bed to ambulate yet. Denies CP, palpitations, dizziness.   Echo shows normal left ventricular systolic function. The ejection fraction is 50-50%. There was mild mitral regurgitation.  I/O is -1.5  liters yesterday   Objective: Vital signs in last 24 hours: Temp:  [97.4 F (36.3 C)-97.7 F (36.5 C)] 97.7 F (36.5 C) (03/12 0500) Pulse Rate:  [61-142] 92 (03/12 0500) Resp:  [18-20] 18 (03/12 0500) BP: (111-120)/(48-95) 111/48 mmHg (03/12 0500) SpO2:  [93 %-100 %] 98 % (03/12 0500) Weight:  [134 lb 11.2 oz (61.1 kg)] 134 lb 11.2 oz (61.1 kg) (03/12 0500) Last BM Date: 06/10/13  Intake/Output from previous day: 03/11 0701 - 03/12 0700 In: 700 [P.O.:700] Out: 2176 [Urine:2175; Stool:1] Intake/Output this shift:    Medications Current Facility-Administered Medications  Medication Dose Route Frequency Provider Last Rate Last Dose  . acetaminophen (TYLENOL) tablet 650 mg  650 mg Oral Q4H PRN Erlene Quan, PA-C      . ALPRAZolam Duanne Moron) tablet 0.25 mg  0.25 mg Oral TID PRN Erlene Quan, PA-C   0.25 mg at 06/11/13 2344  . azithromycin (ZITHROMAX) tablet 500 mg  500 mg Oral Daily Brittainy  Simmons, PA-C   500 mg at 06/11/13 8502  . budesonide-formoterol (SYMBICORT) 80-4.5 MCG/ACT inhaler 2 puff  2 puff Inhalation BID Pixie Casino, MD   2 puff at 06/12/13 0803  . cycloSPORINE (RESTASIS) 0.05 % ophthalmic emulsion 1 drop  1 drop Both Eyes BID Erlene Quan, PA-C   1 drop at 06/11/13 2322  . ferrous sulfate tablet 325 mg  325 mg Oral Q breakfast Erlene Quan, PA-C   325 mg at 06/11/13 0813  . furosemide (LASIX) tablet 80 mg  80 mg Oral Daily Thayer Headings, MD   80 mg at 06/11/13 7741  . levothyroxine (SYNTHROID, LEVOTHROID) tablet 88 mcg  88 mcg Oral QPM Doreene Burke Kilroy, PA-C   88 mcg at 06/11/13 1730  . metoprolol tartrate (LOPRESSOR) tablet 50 mg  50 mg Oral BID Erlene Quan, PA-C   50 mg at 06/11/13 2046  . ondansetron (ZOFRAN) injection 4 mg  4 mg Intravenous Q6H PRN Doreene Burke Kilroy, PA-C      . pantoprazole (PROTONIX) EC tablet 80 mg  80 mg Oral Q1200 Doreene Burke Kilroy, PA-C   80 mg at 06/11/13 1149  . psyllium (HYDROCIL/METAMUCIL) packet 1 packet  1 packet Oral Daily Erlene Quan, [place]   1 packet at 06/11/13 [phone]  . warfarin (COUMADIN) tablet 4 mg  4 mg Oral Once per day on Sun Mon Wed  Thu Fri Sat Anh P Pham, RPH   4 mg at 06/11/13 1730  . warfarin (COUMADIN) tablet 6 mg  6 mg Oral Q Tue-1800 Anh P Pham, RPH   6 mg at 06/10/13 1708  . Warfarin - Pharmacist Dosing Inpatient   Does not apply q1800 Anh P Pham, RPH        PE: General appearance: alert, cooperative and no distress Lungs: clear to auscultation bilaterally Heart: irregularly irregular rhythm, HR 120-140 Extremities: no LEE Pulses: 2+ and symmetric Skin: warm and dry Neurologic: Grossly normal  Lab Results:   Recent Labs  06/09/13 1225 06/10/13 0404  WBC 8.8 7.6  HGB 10.8* 10.0*  HCT 32.4* 28.7*  PLT 204 197   BMET  Recent Labs  06/10/13 0404 06/11/13 0449 06/12/13 0553  NA 136* 137 136*  K 4.0 4.0 3.4*  CL 100 98 94*  CO2 23 24 26   GLUCOSE 104* 98 98  BUN 22 26* 31*  CREATININE 1.55* 1.66*  1.72*  CALCIUM 8.7 9.0 8.8   PT/INR  Recent Labs  06/10/13 0404 06/11/13 0449 06/12/13 0553  LABPROT 27.2* 25.1* 20.7*  INR 2.63* 2.37* 1.84*   Cardiac Panel (last 3 results)  Recent Labs  06/09/13 1551  TROPONINI <0.30    Studies/Results:  2D echo 06/09/13 - result pending.    Filed Weights   06/10/13 0629 06/11/13 0622 06/12/13 0500  Weight: 139 lb 1.6 oz (63.095 kg) 135 lb 12.8 oz (61.598 kg) 134 lb 11.2 oz (61.1 kg)   BNP (last 3 results)  Recent Labs  06/09/13 1551  PROBNP 8505.0*   Assessment/Plan  Principal Problem:   Dyspnea on exertion Active Problems:   HYPERTENSION   Chronic atrial fibrillation   GERD   CARDIAC PACEMAKER IN SITU- MDT 2005   Long term current use of anticoagulant   Hypothyroidism   Chronic renal insufficiency, stage III (moderate)   Acute diastolic congestive heart failure   Hypertensive cardiovascular disease   Macular degeneration    LOS: 3 days    1. Congestive heart failure:   She has never had congestive heart failure in the past. She admits to eating lots of salt and her daughter who was with her today agreed with that assessment.  She likely has some degree of diastolic dysfunction.  Her systolic function is normal.  We have diuresed her to the point that her creatinine is increasing and yet she still has marked DOE and even shortness of breath washing her face at the sink.  Some of this [place] actually be due to her atrial fibrillation with a rapid rate. We'll increase her rate controlling medications.  2. Atrial fibrillation:-Status post pacemaker implantation,  Her HR is rapid today.   She's been tried on amiodarone but there was a question of whether that caused some problems with her eyes. She has chronic kidney disease-stage IV so digoxin is not a good option. Will increase her metoprolol.  Was could consider adding dilt of her HR remains elevated.  We will need to adequately control her HR before she feels good  enough to go home. Possible DC tomorrow or over the weekend.  3. Hypertension:  Continue with medical therapy.  4. Dyspnea: She was started on antibiotics for possible pneumonia.  Will DC tomorrow.  No fever.  wBC is normal.  5.  Chronic Kidney diseasse - stage IV - per medical doctor   Ramond Dial., MD, [facility] 06/12/2013, 8:18 AM

## 2013-06-12 NOTE — Progress Notes (Signed)
Physical Therapy Treatment Patient Details Name: [name] MRN: [id number] DOB: 1921-06-28 Today's Date: 06/12/2013 Time: 1022-1039 PT Time Calculation (min): 17 min  PT Assessment / Plan / Recommendation  History of Present Illness Pleasant 80 y/o female with a history of atrial fibrillation. She had been in NSR on low dose Amiodarone in the past but apparently there was a question of this causing worsening of her macular degeneration and the pt stopped it. She has been in AF with CVR on past few office visits (she saw Dr Lovena Le 05/13/13). She had a MDT pacemaker implanted in Oct 2005 and Dr Lovena Le is following. CHF   PT Comments   Pt eager to ambulate but upon standing from chair pt with SOB and fatigue and needed to sit. O2 sat 95% on RA. On second trial same response with pt in Afib HR 108-154 non sustained at any rate. Pt with continued varied rate and fatigue and transferred back to bed with HEP and gait deferred at this time. Will continue to follow. RN aware of above.   Follow Up Recommendations  Home health PT     Does the patient have the potential to tolerate intense rehabilitation     Barriers to Discharge        Equipment Recommendations       Recommendations for Other Services    Frequency     Progress towards PT Goals Progress towards PT goals: Not progressing toward goals - comment (limited by AFib and fatigue)  Plan Discharge plan needs to be updated    Precautions / Restrictions Precautions Precautions: Fall Precaution Comments: watch sats   Pertinent Vitals/Pain See above No pain    Mobility  Bed Mobility Overal bed mobility: Needs Assistance General bed mobility comments: supine to sit mod I with min assist to scoot up in bed Transfers Overall transfer level: Needs assistance Transfers: Stand Pivot Transfers;Sit to/from Stand Sit to Stand: Modified independent (Device/Increase time) Stand pivot transfers: Min assist General transfer comment: pt  performed sit to stand x 3 from chair and limited activity due to fatigue and Afib. hand held assist to pivot chair to bed    Exercises     PT Diagnosis:    PT Problem List:   PT Treatment Interventions:     PT Goals (current goals can now be found in the care plan section)    Visit Information  Last PT Received On: 06/12/13 Assistance Needed: +1 History of Present Illness: Pleasant 80 y/o female with a history of atrial fibrillation. She had been in NSR on low dose Amiodarone in the past but apparently there was a question of this causing worsening of her macular degeneration and the pt stopped it. She has been in AF with CVR on past few office visits (she saw Dr Lovena Le 05/13/13). She had a MDT pacemaker implanted in Oct 2005 and Dr Lovena Le is following. CHF    Subjective Data      Cognition  Cognition Arousal/Alertness: Awake/alert Behavior During Therapy: WFL for tasks assessed/performed Overall Cognitive Status: Within Functional Limits for tasks assessed    Balance     End of Session PT - End of Session Activity Tolerance: Patient limited by fatigue;Treatment limited secondary to medical complications (Comment) Patient left: in bed;with call bell/phone within Silva;with family/visitor present Nurse Communication: Mobility status   GP     [name] 06/12/2013, 11:17 AM [name], [place]

## 2013-06-12 NOTE — Progress Notes (Signed)
Pharmacy Note-Anticoagulation  Pharmacy Consult :  80 y.o. female is currently on Coumadin for atrial fibrillation .   Latest Labs : Hematology :  Recent Labs  06/09/13 1551 06/10/13 0404 06/11/13 0449 06/12/13 0553  HGB  --  10.0*  --   --   HCT  --  28.7*  --   --   PLT  --  197  --   --   LABPROT 25.4* 27.2* 25.1* 20.7*  INR 2.40* 2.63* 2.37* 1.84*  CREATININE  --  1.55* 1.66* 1.72*    Lab Results  Component Value Date   INR 1.84* 06/12/2013   INR 2.37* 06/11/2013   INR 2.63* 06/10/2013        HGB 10.0* 06/10/2013   HGB 10.8* 06/09/2013    Current Medication[s] Include: Medication PTA: Facility-administered medications prior to admission  Medication Dose Route Frequency Provider Last Rate Last Dose  . TDaP (BOOSTRIX) injection 0.5 mL  0.5 mL Intramuscular Once Elby Showers, MD       Prescriptions prior to admission  Medication Sig Dispense Refill  . ALPRAZolam (XANAX) 0.5 MG tablet Take 0.5 mg by mouth at bedtime.      . Cholecalciferol (VITAMIN D3) 1000 UNITS CAPS Take 1 capsule by mouth daily.       . cycloSPORINE (RESTASIS) 0.05 % ophthalmic emulsion Place 1 drop into both eyes 2 (two) times daily.       Marland Kitchen esomeprazole (NEXIUM) 40 MG capsule Take 1 capsule (40 mg total) by mouth daily before breakfast.  90 capsule  3  . ferrous sulfate 325 (65 FE) MG tablet Take 325 mg by mouth daily with breakfast.        . Fluticasone-Salmeterol (ADVAIR DISKUS) 250-50 MCG/DOSE AEPB Inhale 1 puff into the lungs every 12 (twelve) hours.  60 each  5  . levothyroxine (SYNTHROID, LEVOTHROID) 88 MCG tablet Take 88 mcg by mouth every evening.      . metoprolol (LOPRESSOR) 50 MG tablet Take 1 tablet (50 mg total) by mouth 2 (two) times daily.  180 tablet  3  . Multiple Vitamins-Minerals (ICAPS) CAPS Take 1 capsule by mouth 2 (two) times daily.       . psyllium (METAMUCIL) 58.6 % powder Take 1 packet by mouth every evening.       . warfarin (COUMADIN) 4 MG tablet Take 4-6 mg by mouth daily.  Take 4mg  daily except 6mg  on Tuesdays       Scheduled:  Scheduled:  . azithromycin  500 mg Oral Daily  . budesonide-formoterol  2 puff Inhalation BID  . cycloSPORINE  1 drop Both Eyes BID  . ferrous sulfate  325 mg Oral Q breakfast  . furosemide  80 mg Oral Daily  . levothyroxine  88 mcg Oral QPM  . metoprolol  100 mg Oral BID  . pantoprazole  80 mg Oral Q1200  . psyllium  1 packet Oral Daily  . warfarin  4 mg Oral Once per day on Sun Mon Wed Thu Fri Sat  . warfarin  6 mg Oral Q Tue-1800  . Warfarin - Pharmacist Dosing Inpatient   Does not apply q1800   Antibiotic[s]: Anti-infectives   Start     Dose/Rate Route Frequency Ordered Stop   06/10/13 1600  azithromycin (ZITHROMAX) tablet 500 mg     500 mg Oral Daily 06/10/13 1449     06/09/13 1730  cefTRIAXone (ROCEPHIN) 1 g in dextrose 5 % 50 mL IVPB    Comments:  Then re evaluate   1 g 100 mL/hr over 30 Minutes Intravenous Every 24 hours 06/09/13 1728 06/11/13 1801      Assessment :  Today's INR has dropped below the therapeutic range despite receiving Coumadin doses as ordered..   INR is 1.84..    No bleeding complications observed.  Goal :  INR goal is 2-3    Plan : 1. Will give Coumadin 8 mg po x 1 today. 2. Daily INR's, CBC. Monitor for bleeding complications  Estelle June, Pharm.D. 06/12/2013  12:43 PM

## 2013-06-12 NOTE — Progress Notes (Signed)
Patient alert and oriented x4 throughout shift.  Family at bedside this afternoon.  Vital signs stable.  Encouraged patient to watch A-fib and/or heart failure videos, but patient refused, stating "it's not going to make any difference, I'm still going to eat what I want".  Patient and family deny any questions or concerns at this time.  Will continue to monitor.

## 2013-06-13 DIAGNOSIS — I4891 Unspecified atrial fibrillation: Secondary | ICD-10-CM | POA: Diagnosis not present

## 2013-06-13 DIAGNOSIS — N183 Chronic kidney disease, stage 3 unspecified: Secondary | ICD-10-CM

## 2013-06-13 DIAGNOSIS — I509 Heart failure, unspecified: Secondary | ICD-10-CM | POA: Diagnosis not present

## 2013-06-13 DIAGNOSIS — I5031 Acute diastolic (congestive) heart failure: Secondary | ICD-10-CM | POA: Diagnosis not present

## 2013-06-13 LAB — BASIC METABOLIC PANEL
BUN: 32 mg/dL — ABNORMAL HIGH (ref 6–23)
CO2: 27 mEq/L (ref 19–32)
Calcium: 8.9 mg/dL (ref 8.4–10.5)
Chloride: 96 mEq/L (ref 96–112)
Creatinine, Ser: 1.78 mg/dL — ABNORMAL HIGH (ref 0.50–1.10)
GFR, EST AFRICAN AMERICAN: 28 mL/min — AB (ref >90)
GFR, EST NON AFRICAN AMERICAN: 24 mL/min — AB (ref >90)
Glucose, Bld: 104 mg/dL — ABNORMAL HIGH (ref 70–99)
POTASSIUM: 3.5 meq/L — AB (ref 3.7–5.3)
Sodium: 139 mEq/L (ref 137–147)

## 2013-06-13 LAB — PROTIME-INR
INR: 1.79 — ABNORMAL HIGH (ref 0.00–1.49)
PROTHROMBIN TIME: 20.3 s — AB (ref 11.6–15.2)

## 2013-06-13 MED ORDER — SODIUM CHLORIDE 0.9 % IV SOLN
INTRAVENOUS | Status: AC
  Administered 2013-06-13: 10:00:00 via INTRAVENOUS

## 2013-06-13 MED ORDER — DILTIAZEM HCL 30 MG PO TABS
30.0000 mg | ORAL_TABLET | Freq: Three times a day (TID) | ORAL | Status: DC
  Administered 2013-06-13 (×3): 30 mg via ORAL
  Filled 2013-06-13 (×7): qty 1

## 2013-06-13 MED ORDER — WARFARIN SODIUM 10 MG PO TABS
10.0000 mg | ORAL_TABLET | Freq: Once | ORAL | Status: AC
  Administered 2013-06-13: 10 mg via ORAL
  Filled 2013-06-13: qty 1

## 2013-06-13 NOTE — Progress Notes (Signed)
Patient alert and oriented x4 throughout shift.  Son and daughter at bedside this afternoon.  Vital signs stable.  Patient states she has no questions or concerns at this time.  Walked in hallway with one assist, ambulated 22ft.  Heart rate remained below 140 with ambulation.  MD aware.  Will continue to monitor.

## 2013-06-13 NOTE — Progress Notes (Signed)
SATURATION QUALIFICATIONS: (This note is used to comply with regulatory documentation for home oxygen)  Patient Saturations on Room Air at Rest = 92%  Patient Saturations on Room Air while Ambulating = 86%  Patient Saturations on 2 Liters of oxygen while Ambulating = 94%  Please briefly explain why patient needs home oxygen: Patient's oxygen saturation fell 86% on room air while ambulating.  When 2L via nasal cannula applied, patient's oxygen saturation rose to 94% while ambulating in hallway.

## 2013-06-13 NOTE — Progress Notes (Signed)
Pharmacy Note-Anticoagulation  Pharmacy Consult :  80 y.o. female is currently on Coumadin for atrial fibrillation    Latest Labs : Hematology :  Recent Labs  06/11/13 0449 06/12/13 0553 06/13/13 0545  LABPROT 25.1* 20.7* 20.3*  INR 2.37* 1.84* 1.79*  CREATININE 1.66* 1.72* 1.78*    Lab Results  Component Value Date   INR 1.79* 06/13/2013   INR 1.84* 06/12/2013   INR 2.37* 06/11/2013        HGB 10.0* 06/10/2013   HGB 10.8* 06/09/2013   HGB 12.0 11/12/2012    Current Medication[s] Include: Medication PTA: Facility-administered medications prior to admission  Medication Dose Route Frequency Provider Last Rate Last Dose  . TDaP (BOOSTRIX) injection 0.5 mL  0.5 mL Intramuscular Once Elby Showers, MD       Prescriptions prior to admission  Medication Sig Dispense Refill  . ALPRAZolam (XANAX) 0.5 MG tablet Take 0.5 mg by mouth at bedtime.      . Cholecalciferol (VITAMIN D3) 1000 UNITS CAPS Take 1 capsule by mouth daily.       . cycloSPORINE (RESTASIS) 0.05 % ophthalmic emulsion Place 1 drop into both eyes 2 (two) times daily.       Marland Kitchen esomeprazole (NEXIUM) 40 MG capsule Take 1 capsule (40 mg total) by mouth daily before breakfast.  90 capsule  3  . ferrous sulfate 325 (65 FE) MG tablet Take 325 mg by mouth daily with breakfast.        . Fluticasone-Salmeterol (ADVAIR DISKUS) 250-50 MCG/DOSE AEPB Inhale 1 puff into the lungs every 12 (twelve) hours.  60 each  5  . levothyroxine (SYNTHROID, LEVOTHROID) 88 MCG tablet Take 88 mcg by mouth every evening.      . metoprolol (LOPRESSOR) 50 MG tablet Take 1 tablet (50 mg total) by mouth 2 (two) times daily.  180 tablet  3  . Multiple Vitamins-Minerals (ICAPS) CAPS Take 1 capsule by mouth 2 (two) times daily.       . psyllium (METAMUCIL) 58.6 % powder Take 1 packet by mouth every evening.       . warfarin (COUMADIN) 4 MG tablet Take 4-6 mg by mouth daily. Take 4mg  daily except 6mg  on Tuesdays       Scheduled:  Scheduled:  . azithromycin   500 mg Oral Daily  . budesonide-formoterol  2 puff Inhalation BID  . cycloSPORINE  1 drop Both Eyes BID  . diltiazem  30 mg Oral 3 times per day  . ferrous sulfate  325 mg Oral Q breakfast  . levothyroxine  88 mcg Oral QPM  . metoprolol  100 mg Oral BID  . pantoprazole  80 mg Oral Q1200  . psyllium  1 packet Oral Daily  . Warfarin - Pharmacist Dosing Inpatient   Does not apply q1800   Infusion[s]: Infusions:  . sodium chloride     Antibiotic[s]: Anti-infectives   Start     Dose/Rate Route Frequency Ordered Stop   06/10/13 1600  azithromycin (ZITHROMAX) tablet 500 mg     500 mg Oral Daily 06/10/13 1449     06/09/13 1730  cefTRIAXone (ROCEPHIN) 1 g in dextrose 5 % 50 mL IVPB    Comments:  Then re evaluate   1 g 100 mL/hr over 30 Minutes Intravenous Every 24 hours 06/09/13 1728 06/11/13 1801      Assessment :  Today's INR is still slightly below target goal.   INR is 1.79.    No bleeding complications observed.  Goal :  INR goal is 2-3    Plan : 1. Coumadin 10 mg po now. 2. Contunue Daily INR's, CBC. Monitor for bleeding complications  Estelle June, Pharm.D. 06/13/2013  9:09 AM

## 2013-06-13 NOTE — Progress Notes (Signed)
Subjective:  Pleasant 80 y/o female with a history of atrial fibrillation. She had been in NSR on low dose Amiodarone in the past but apparently there was a question of this causing worsening of her macular degeneration and the pt stopped it. She has been in AF with CVR on past few office visits (she saw Dr Lovena Le 05/13/13). She had a MDT pacemaker implanted in Oct 2005 and Dr Lovena Le is following.  She presented to Dr Verlene Mayer office this am with complaints of dyspnea for one week. She denies cough, fever, or orthopnea. Dr Renold Genta felt her HR was fast and was concerned she Jupin have CHF. She is in the ER now for further evaluation. The pt's HR is 100-110, temp 99, pro BNP 8505. CXR suggest CHF vs pneumonia.   She states her breathing is improved, but she continues on 2L [place]. She has not been out of bed to ambulate yet. Denies CP, palpitations, dizziness.   Echo shows normal left ventricular systolic function. The ejection fraction is 50-50%. There was mild mitral regurgitation.  I/O is -1.5  liters yesterday   Objective: Vital signs in last 24 hours: Temp:  [97.7 F (36.5 C)-97.9 F (36.6 C)] 97.7 F (36.5 C) (03/13 0416) Pulse Rate:  [78-154] 100 (03/13 0416) Resp:  [18-20] 20 (03/13 0416) BP: (106-128)/(50-60) 106/58 mmHg (03/13 0416) SpO2:  [94 %-100 %] 100 % (03/13 0416) Weight:  [133 lb 13.1 oz (60.7 kg)] 133 lb 13.1 oz (60.7 kg) (03/13 0416) Last BM Date: 06/12/13  Intake/Output from previous day: 03/12 0701 - 03/13 0700 In: 91 [P.O.:820] Out: 1650 [Urine:1650] Intake/Output this shift:    Medications Current Facility-Administered Medications  Medication Dose Route Frequency Provider Last Rate Last Dose  . acetaminophen (TYLENOL) tablet 650 mg  650 mg Oral Q4H PRN Erlene Quan, PA-C      . ALPRAZolam Duanne Moron) tablet 0.25 mg  0.25 mg Oral TID PRN Erlene Quan, PA-C   0.25 mg at 06/11/13 2344  . azithromycin (ZITHROMAX) tablet 500 mg  500 mg Oral Daily Brittainy Simmons,  PA-C   500 mg at 06/12/13 1048  . budesonide-formoterol (SYMBICORT) 80-4.5 MCG/ACT inhaler 2 puff  2 puff Inhalation BID Pixie Casino, MD   2 puff at 06/12/13 2037  . cycloSPORINE (RESTASIS) 0.05 % ophthalmic emulsion 1 drop  1 drop Both Eyes BID Erlene Quan, PA-C   1 drop at 06/12/13 2108  . ferrous sulfate tablet 325 mg  325 mg Oral Q breakfast Erlene Quan, PA-C   325 mg at 06/12/13 1048  . furosemide (LASIX) tablet 80 mg  80 mg Oral Daily Thayer Headings, MD   80 mg at 06/12/13 1048  . levothyroxine (SYNTHROID, LEVOTHROID) tablet 88 mcg  88 mcg Oral QPM Doreene Burke Kilroy, PA-C   88 mcg at 06/12/13 1731  . metoprolol (LOPRESSOR) tablet 100 mg  100 mg Oral BID Thayer Headings, MD   100 mg at 06/12/13 2106  . ondansetron (ZOFRAN) injection 4 mg  4 mg Intravenous Q6H PRN Erlene Quan, PA-C      . pantoprazole (PROTONIX) EC tablet 80 mg  80 mg Oral [address] PA-C   80 mg at 06/12/13 1218  . psyllium (HYDROCIL/METAMUCIL) packet 1 packet  1 packet Oral Daily Erlene Quan, [place]   1 packet at 06/12/13 2107  . Warfarin - Pharmacist Dosing Inpatient   Does not apply [place], [facility]  PE: General appearance: alert, cooperative and no distress Lungs: clear to auscultation bilaterally Heart: irregularly irregular rhythm, HR 120-140 Extremities: no LEE Pulses: 2+ and symmetric Skin: warm and dry Neurologic: Grossly normal  Lab Results:  No results found for this basename: WBC, HGB, HCT, PLT,  in the last 72 hours BMET  Recent Labs  06/11/13 0449 06/12/13 0553 06/13/13 0545  NA 137 136* 139  K 4.0 3.4* 3.5*  CL 98 94* 96  CO2 24 26 27   GLUCOSE 98 98 104*  BUN 26* 31* 32*  CREATININE 1.66* 1.72* 1.78*  CALCIUM 9.0 8.8 8.9   PT/INR  Recent Labs  06/11/13 0449 06/12/13 0553 06/13/13 0545  LABPROT 25.1* 20.7* 20.3*  INR 2.37* 1.84* 1.79*   Cardiac Panel (last 3 results) No results found for this basename: CKTOTAL, CKMB, TROPONINI, RELINDX,  in the last 72  hours  Studies/Results:  2D echo 06/09/13 - result pending.    Filed Weights   06/11/13 0622 06/12/13 0500 06/13/13 0416  Weight: 135 lb 12.8 oz (61.598 kg) 134 lb 11.2 oz (61.1 kg) 133 lb 13.1 oz (60.7 kg)   BNP (last 3 results)  Recent Labs  06/09/13 1551  PROBNP 8505.0*   Assessment/Plan  Principal Problem:   Dyspnea on exertion Active Problems:   HYPERTENSION   Chronic atrial fibrillation   GERD   CARDIAC PACEMAKER IN SITU- MDT 2005   Long term current use of anticoagulant   Hypothyroidism   Chronic renal insufficiency, stage III (moderate)   Acute diastolic congestive heart failure   Hypertensive cardiovascular disease   Macular degeneration    LOS: 4 days    1. Congestive heart failure:   She has never had congestive heart failure in the past. She admits to eating lots of salt and her daughter who was with her today agreed with that assessment.  She likely has some degree of diastolic dysfunction.  Her systolic function is normal.  We have diuresed her to the point that her creatinine is increasing and yet she still has marked DOE and even shortness of breath washing her face at the sink  Some of this [place] actually be due to her atrial fibrillation with a rapid rate. We'll increase her rate controlling medications.  We will hold her lasix today due to her increasing creatinine  2. Atrial fibrillation:-Status post pacemaker implantation,  Her HR is rapid today.   She's been tried on amiodarone but there was a question of whether that caused some problems with her eyes. She has chronic kidney disease-stage IV so digoxin is not a good option. Will increase her metoprolol.    Will add low dose diltiazem.  Her BP is marginal ( I think she is volume depleted) so I will give her 500 cc NS over 4 hours.    We will need to adequately control her HR before she feels good enough to go home. Possible DC tomorrow or over the weekend.  3. Hypertension:  Continue with medical  therapy.  Her BP is on the low side this am.  I suspect she is volume depleted.  4. Dyspnea: She was started on antibiotics for possible pneumonia.     5.  Chronic Kidney diseasse - stage IV - per medical doctor   Ramond Dial., MD, [facility] 06/13/2013, 8:19 AM

## 2013-06-14 DIAGNOSIS — I4892 Unspecified atrial flutter: Secondary | ICD-10-CM

## 2013-06-14 DIAGNOSIS — I5031 Acute diastolic (congestive) heart failure: Secondary | ICD-10-CM | POA: Diagnosis not present

## 2013-06-14 DIAGNOSIS — Z950 Presence of cardiac pacemaker: Secondary | ICD-10-CM | POA: Diagnosis not present

## 2013-06-14 LAB — BASIC METABOLIC PANEL
BUN: 30 mg/dL — ABNORMAL HIGH (ref 6–23)
CALCIUM: 8.8 mg/dL (ref 8.4–10.5)
CO2: 28 mEq/L (ref 19–32)
Chloride: 96 mEq/L (ref 96–112)
Creatinine, Ser: 1.73 mg/dL — ABNORMAL HIGH (ref 0.50–1.10)
GFR calc Af Amer: 29 mL/min — ABNORMAL LOW (ref >90)
GFR, EST NON AFRICAN AMERICAN: 25 mL/min — AB (ref >90)
Glucose, Bld: 97 mg/dL (ref 70–99)
Potassium: 3.7 mEq/L (ref 3.7–5.3)
Sodium: 137 mEq/L (ref 137–147)

## 2013-06-14 LAB — PROTIME-INR
INR: 2.72 — AB (ref 0.00–1.49)
Prothrombin Time: 27.9 seconds — ABNORMAL HIGH (ref 11.6–15.2)

## 2013-06-14 MED ORDER — FUROSEMIDE 10 MG/ML IJ SOLN
40.0000 mg | INTRAMUSCULAR | Status: AC
  Administered 2013-06-14: 40 mg via INTRAVENOUS
  Filled 2013-06-14: qty 4

## 2013-06-14 MED ORDER — DILTIAZEM HCL ER COATED BEADS 120 MG PO CP24
120.0000 mg | ORAL_CAPSULE | Freq: Every day | ORAL | Status: DC
  Administered 2013-06-15 – 2013-06-16 (×2): 120 mg via ORAL
  Filled 2013-06-14 (×2): qty 1

## 2013-06-14 MED ORDER — WARFARIN SODIUM 2 MG PO TABS
2.0000 mg | ORAL_TABLET | Freq: Once | ORAL | Status: AC
  Administered 2013-06-14: 2 mg via ORAL
  Filled 2013-06-14: qty 1

## 2013-06-14 NOTE — Progress Notes (Signed)
ANTICOAGULATION CONSULT NOTE - Follow Up Consult  Pharmacy Consult for Warfarin Indication: atrial fibrillation  No Known Allergies  Patient Measurements: Height: 5\' 5"  (165.1 cm) Weight: 135 lb 3.2 oz (61.326 kg) (scale b) IBW/kg (Calculated) : 57  Vital Signs: Temp: 98 F (36.7 C) (03/14 0447) Temp src: Oral (03/14 0447) BP: 96/41 mmHg (03/14 0447) Pulse Rate: 81 (03/14 0447)  Labs:  Recent Labs  06/12/13 0553 06/13/13 0545 06/14/13 0522  LABPROT 20.7* 20.3* 27.9*  INR 1.84* 1.79* 2.72*  CREATININE 1.72* 1.78* 1.73*    Estimated Creatinine Clearance: 19.8 ml/min (by C-G formula based on Cr of 1.73).   Medications:  Scheduled:  . azithromycin  500 mg Oral Daily  . budesonide-formoterol  2 puff Inhalation BID  . cycloSPORINE  1 drop Both Eyes BID  . [START ON 06/15/2013] diltiazem  120 mg Oral Daily  . ferrous sulfate  325 mg Oral Q breakfast  . furosemide  40 mg Intravenous NOW  . levothyroxine  88 mcg Oral QPM  . metoprolol  100 mg Oral BID  . pantoprazole  80 mg Oral Q1200  . psyllium  1 packet Oral Daily  . Warfarin - Pharmacist Dosing Inpatient   Does not apply q1800    Assessment: 80 y.o F presented with SOB for one week 3/9 on Coumadin from PTA for Afib. Coumadin con't from PTA for Afib, INR 2.4 on admit (4mg  daily except 6mg  on Tues PTA) INR now 2.72. Patient also on Azithromycin.   Hgb 10.0, HCT 28.7, Plts 197, no bleeding noted.  Goal of Therapy:  INR 2-3 Monitor platelets by anticoagulation protocol: Yes   Plan:  - Coumadin 2mg  x 1 tonight (likely are not seeing full effects of last two doses: 8mg  and 10mg ) - Daily INR - Monitor s/s bleeding  Ollen Gross B. Leitha Schuller, PharmD Clinical Pharmacist - Resident Phone: [phone] Pager: [phone] 06/14/2013 9:41 AM

## 2013-06-14 NOTE — Progress Notes (Signed)
DAILY PROGRESS NOTE  Subjective:  No events overnight. Rate control for a-fib is controlled. Creatinine has stabilized at 1.7. Still has O2 requirement with ambulation.  Objective:  Temp:  [98 F (36.7 C)-98.4 F (36.9 C)] 98 F (36.7 C) (03/14 0447) Pulse Rate:  [76-103] 81 (03/14 0447) Resp:  [18-20] 18 (03/14 0447) BP: (91-121)/(40-62) 96/41 mmHg (03/14 0447) SpO2:  [97 %-100 %] 99 % (03/14 0447) Weight:  [135 lb 3.2 oz (61.326 kg)] 135 lb 3.2 oz (61.326 kg) (03/14 0447) Weight change: 1 lb 6.1 oz (0.626 kg)  Intake/Output from previous day: 03/13 0701 - 03/14 0700 In: 840 [P.O.:840] Out: -   Intake/Output from this shift:    Medications: Current Facility-Administered Medications  Medication Dose Route Frequency Provider Last Rate Last Dose  . acetaminophen (TYLENOL) tablet 650 mg  650 mg Oral Q4H PRN Erlene Quan, PA-C      . ALPRAZolam Duanne Moron) tablet 0.25 mg  0.25 mg Oral TID PRN Erlene Quan, PA-C   0.25 mg at 06/13/13 2125  . azithromycin (ZITHROMAX) tablet 500 mg  500 mg Oral Daily Brittainy Simmons, PA-C   500 mg at 06/13/13 0953  . budesonide-formoterol (SYMBICORT) 80-4.5 MCG/ACT inhaler 2 puff  2 puff Inhalation BID Pixie Casino, MD   2 puff at 06/13/13 2054  . cycloSPORINE (RESTASIS) 0.05 % ophthalmic emulsion 1 drop  1 drop Both Eyes BID Erlene Quan, PA-C   1 drop at 06/13/13 2103  . diltiazem (CARDIZEM) tablet 30 mg  30 mg Oral 3 times per day Thayer Headings, MD   30 mg at 06/13/13 2103  . ferrous sulfate tablet 325 mg  325 mg Oral Q breakfast Erlene Quan, PA-C   325 mg at 06/13/13 6073  . levothyroxine (SYNTHROID, LEVOTHROID) tablet 88 mcg  88 mcg Oral QPM Doreene Burke Kilroy, PA-C   88 mcg at 06/13/13 1736  . metoprolol (LOPRESSOR) tablet 100 mg  100 mg Oral BID Thayer Headings, MD   100 mg at 06/13/13 2103  . ondansetron (ZOFRAN) injection 4 mg  4 mg Intravenous Q6H PRN Doreene Burke Kilroy, PA-C      . pantoprazole (PROTONIX) EC tablet 80 mg  80 mg Oral  Q1200 Doreene Burke Kilroy, PA-C   80 mg at 06/13/13 1235  . psyllium (HYDROCIL/METAMUCIL) packet 1 packet  1 packet Oral Daily Erlene Quan, [place]   1 packet at 06/13/13 2059  . Warfarin - Pharmacist Dosing Inpatient   Does not apply q1800 Lynelle Doctor, [facility]        Physical Exam: General appearance: cooperative and no distress Lungs: clear to auscultation bilaterally Heart: regular rate and rhythm, S1, S2 normal, no murmur, click, rub or gallop Extremities: edema trace  Lab Results: Results for orders placed during the hospital encounter of 06/09/13 (from the past 48 hour(s))  PROTIME-INR     Status: Abnormal   Collection Time    06/13/13  5:45 AM      Result Value Ref [place]   Prothrombin Time 20.3 (*) 11.6 - 15.2 seconds   INR 1.79 (*) 0.00 - 7.10  BASIC METABOLIC PANEL     Status: Abnormal   Collection Time    06/13/13  5:45 AM      Result Value Ref [place]   Sodium 139  137 - 147 mEq/L   Potassium 3.5 (*) 3.7 - 5.3 mEq/L   Chloride 96  96 - 112 mEq/L   CO2 27  19 - 32 mEq/L   Glucose, Bld 104 (*) 70 - 99 mg/dL   BUN 32 (*) 6 - 23 mg/dL   Creatinine, Ser 1.78 (*) 0.50 - 1.10 mg/dL   Calcium 8.9  8.4 - 10.5 mg/dL   GFR calc non Af Amer 24 (*) >90 mL/min   GFR calc Af Amer 28 (*) >90 mL/min   Comment: (NOTE)     The eGFR has been calculated using the CKD EPI equation.     This calculation has not been validated in all clinical situations.     eGFR's persistently <90 mL/min signify possible Chronic Kidney     Disease.  PROTIME-INR     Status: Abnormal   Collection Time    06/14/13  5:22 AM      Result Value Ref [place]   Prothrombin Time 27.9 (*) 11.6 - 15.2 seconds   INR 2.72 (*) 0.00 - 8.93  BASIC METABOLIC PANEL     Status: Abnormal   Collection Time    06/14/13  5:22 AM      Result Value Ref [place]   Sodium 137  137 - 147 mEq/L   Potassium 3.7  3.7 - 5.3 mEq/L   Chloride 96  96 - 112 mEq/L   CO2 28  19 - 32 mEq/L   Glucose, Bld 97  70 - 99 mg/dL   BUN 30 (*) 6 - 23 mg/dL    Creatinine, Ser 1.73 (*) 0.50 - 1.10 mg/dL   Calcium 8.8  8.4 - 10.5 mg/dL   GFR calc non Af Amer 25 (*) >90 mL/min   GFR calc Af Amer 29 (*) >90 mL/min   Comment: (NOTE)     The eGFR has been calculated using the CKD EPI equation.     This calculation has not been validated in all clinical situations.     eGFR's persistently <90 mL/min signify possible Chronic Kidney     Disease.    Imaging: No results found.  Assessment:  1. Principal Problem: 2.   Dyspnea on exertion 3. Active Problems: 4.   HYPERTENSION 5.   Chronic atrial fibrillation 6.   GERD 7.   CARDIAC PACEMAKER IN SITU- MDT 2005 8.   Long term current use of anticoagulant 9.   Hypothyroidism 10.   Chronic renal insufficiency, stage III (moderate) 11.   Acute diastolic congestive heart failure 12.   Hypertensive cardiovascular disease 13.   Macular degeneration 14.   Plan:  1. Baseline creatinine is about 1.7-1.8.  Would give additional diuresis today and ambulate later to see if she is still requiring oxygen (this was not a home requirement). Not ready for d/c today, possibly tomorrow. Check BNP in the am.  Time Spent Directly with Patient:  15 minutes  Length of Stay:  LOS: 5 days   Pixie Casino, MD, [facility] Attending Cardiologist [facility]  Juniper Snyders C 06/14/2013, 7:36 AM

## 2013-06-15 DIAGNOSIS — N184 Chronic kidney disease, stage 4 (severe): Secondary | ICD-10-CM | POA: Diagnosis not present

## 2013-06-15 DIAGNOSIS — J449 Chronic obstructive pulmonary disease, unspecified: Secondary | ICD-10-CM | POA: Diagnosis not present

## 2013-06-15 DIAGNOSIS — I5031 Acute diastolic (congestive) heart failure: Secondary | ICD-10-CM | POA: Diagnosis not present

## 2013-06-15 DIAGNOSIS — I4891 Unspecified atrial fibrillation: Secondary | ICD-10-CM | POA: Diagnosis not present

## 2013-06-15 LAB — PRO B NATRIURETIC PEPTIDE: PRO B NATRI PEPTIDE: 4062 pg/mL — AB (ref 0–450)

## 2013-06-15 LAB — PROTIME-INR
INR: 3.11 — AB (ref 0.00–1.49)
PROTHROMBIN TIME: 30.9 s — AB (ref 11.6–15.2)

## 2013-06-15 LAB — BASIC METABOLIC PANEL
BUN: 34 mg/dL — ABNORMAL HIGH (ref 6–23)
CO2: 25 mEq/L (ref 19–32)
CREATININE: 1.78 mg/dL — AB (ref 0.50–1.10)
Calcium: 9 mg/dL (ref 8.4–10.5)
Chloride: 94 mEq/L — ABNORMAL LOW (ref 96–112)
GFR calc non Af Amer: 24 mL/min — ABNORMAL LOW (ref >90)
GFR, EST AFRICAN AMERICAN: 28 mL/min — AB (ref >90)
Glucose, Bld: 102 mg/dL — ABNORMAL HIGH (ref 70–99)
Potassium: 3.3 mEq/L — ABNORMAL LOW (ref 3.7–5.3)
Sodium: 136 mEq/L — ABNORMAL LOW (ref 137–147)

## 2013-06-15 NOTE — Progress Notes (Signed)
DAILY PROGRESS NOTE  Subjective:  No events overnight. A-fib is slightly faster today. Creatinine has stabilized at 1.7. BNP is improved at 4062, from [place].  Objective:  Temp:  [97.7 F (36.5 C)-98.4 F (36.9 C)] 98.4 F (36.9 C) (03/15 0507) Pulse Rate:  [76-101] 101 (03/15 0507) Resp:  [16-18] 18 (03/15 0507) BP: (99-110)/(48-67) 99/48 mmHg (03/15 0507) SpO2:  [99 %-100 %] 99 % (03/15 0507) Weight:  [134 lb 4.8 oz (60.918 kg)] 134 lb 4.8 oz (60.918 kg) (03/15 0507) Weight change: -14.4 oz (-0.408 kg)  Intake/Output from previous day: 03/14 0701 - 03/15 0700 In: 1020 [P.O.:1020] Out: -   Intake/Output from this shift:    Medications: Current Facility-Administered Medications  Medication Dose Route Frequency Provider Last Rate Last Dose  . acetaminophen (TYLENOL) tablet 650 mg  650 mg Oral Q4H PRN Erlene Quan, PA-C      . ALPRAZolam Duanne Moron) tablet 0.25 mg  0.25 mg Oral TID PRN Erlene Quan, PA-C   0.25 mg at 06/13/13 2125  . azithromycin (ZITHROMAX) tablet 500 mg  500 mg Oral Daily Brittainy Simmons, PA-C   500 mg at 06/14/13 0949  . budesonide-formoterol (SYMBICORT) 80-4.5 MCG/ACT inhaler 2 puff  2 puff Inhalation BID Pixie Casino, MD   2 puff at 06/14/13 2137  . cycloSPORINE (RESTASIS) 0.05 % ophthalmic emulsion 1 drop  1 drop Both Eyes BID Erlene Quan, PA-C   1 drop at 06/14/13 2200  . diltiazem (CARDIZEM CD) 24 hr capsule 120 mg  120 mg Oral Daily Pixie Casino, MD      . ferrous sulfate tablet 325 mg  325 mg Oral Q breakfast Erlene Quan, PA-C   325 mg at 06/14/13 0949  . levothyroxine (SYNTHROID, LEVOTHROID) tablet 88 mcg  88 mcg Oral QPM Doreene Burke Kilroy, PA-C   88 mcg at 06/14/13 1731  . metoprolol (LOPRESSOR) tablet 100 mg  100 mg Oral BID Thayer Headings, MD   100 mg at 06/14/13 2200  . ondansetron (ZOFRAN) injection 4 mg  4 mg Intravenous Q6H PRN Doreene Burke Kilroy, PA-C      . pantoprazole (PROTONIX) EC tablet 80 mg  80 mg Oral [address] PA-C    80 mg at 06/14/13 0949  . psyllium (HYDROCIL/METAMUCIL) packet 1 packet  1 packet Oral Daily Erlene Quan, PA-C   1 packet at 06/14/13 2005  . Warfarin - Pharmacist Dosing Inpatient   Does not apply q1800 Lynelle Doctor, [facility]        Physical Exam: General appearance: cooperative and no distress Lungs: clear to auscultation bilaterally Heart: regular rate and rhythm, S1, S2 normal, no murmur, click, rub or gallop Extremities: edema trace  Lab Results: Results for orders placed during the hospital encounter of 06/09/13 (from the past 48 hour(s))  PROTIME-INR     Status: Abnormal   Collection Time    06/14/13  5:22 AM      Result Value Ref [place]   Prothrombin Time 27.9 (*) 11.6 - 15.2 seconds   INR 2.72 (*) 0.00 - 9.23  BASIC METABOLIC PANEL     Status: Abnormal   Collection Time    06/14/13  5:22 AM      Result Value Ref [place]   Sodium 137  137 - 147 mEq/L   Potassium 3.7  3.7 - 5.3 mEq/L   Chloride 96  96 - 112 mEq/L   CO2 28  19 - 32 mEq/L  Glucose, Bld 97  70 - 99 mg/dL   BUN 30 (*) 6 - 23 mg/dL   Creatinine, Ser 1.73 (*) 0.50 - 1.10 mg/dL   Calcium 8.8  8.4 - 10.5 mg/dL   GFR calc non Af Amer 25 (*) >90 mL/min   GFR calc Af Amer 29 (*) >90 mL/min   Comment: (NOTE)     The eGFR has been calculated using the CKD EPI equation.     This calculation has not been validated in all clinical situations.     eGFR's persistently <90 mL/min signify possible Chronic Kidney     Disease.  PROTIME-INR     Status: Abnormal   Collection Time    06/15/13  4:04 AM      Result Value Ref [place]   Prothrombin Time 30.9 (*) 11.6 - 15.2 seconds   INR 3.11 (*) 0.00 - 8.28  BASIC METABOLIC PANEL     Status: Abnormal   Collection Time    06/15/13  4:04 AM      Result Value Ref [place]   Sodium 136 (*) 137 - 147 mEq/L   Potassium 3.3 (*) 3.7 - 5.3 mEq/L   Chloride 94 (*) 96 - 112 mEq/L   CO2 25  19 - 32 mEq/L   Glucose, Bld 102 (*) 70 - 99 mg/dL   BUN 34 (*) 6 - 23 mg/dL   Creatinine, Ser 1.78  (*) 0.50 - 1.10 mg/dL   Calcium 9.0  8.4 - 10.5 mg/dL   GFR calc non Af Amer 24 (*) >90 mL/min   GFR calc Af Amer 28 (*) >90 mL/min   Comment: (NOTE)     The eGFR has been calculated using the CKD EPI equation.     This calculation has not been validated in all clinical situations.     eGFR's persistently <90 mL/min signify possible Chronic Kidney     Disease.  PRO B NATRIURETIC PEPTIDE     Status: Abnormal   Collection Time    06/15/13  4:04 AM      Result Value Ref Range   Pro B Natriuretic peptide (BNP) 4062.0 (*) 0 - 450 pg/mL    Imaging: No results found.  Assessment:  Principal Problem:   Dyspnea on exertion Active Problems:   HYPERTENSION   Chronic atrial fibrillation   GERD   CARDIAC PACEMAKER IN SITU- MDT 2005   Long term current use of anticoagulant   Hypothyroidism   Chronic renal insufficiency, stage III (moderate)   Acute diastolic congestive heart failure   Hypertensive cardiovascular disease   Macular degeneration   Plan:  1. Diuresed yesterday, but not recorded. HR is now increased today with a-fib and RVR. Will give cardizem LA early today. BP is borderline low, so we need to watch closely. She is due for metoprolol at 10. Not ready for discharge until we can achieve better rate control.   Time Spent Directly with Patient:  15 minutes  Length of Stay:  LOS: 6 days   Pixie Casino, MD, [facility] Attending Cardiologist [facility]  Aundrea Horace C 06/15/2013, 8:43 AM

## 2013-06-15 NOTE — Progress Notes (Signed)
ANTICOAGULATION CONSULT NOTE - Follow Up Consult  Pharmacy Consult for Warfarin Indication: atrial fibrillation  No Known Allergies  Patient Measurements: Height: 5\' 5"  (165.1 cm) Weight: 134 lb 4.8 oz (60.918 kg) (scale b) IBW/kg (Calculated) : 57  Vital Signs: Temp: 97 F (36.1 C) (03/15 0900) Temp src: Oral (03/15 0900) BP: 112/52 mmHg (03/15 0900) Pulse Rate: 112 (03/15 0900)  Labs:  Recent Labs  06/13/13 0545 06/14/13 0522 06/15/13 0404  LABPROT 20.3* 27.9* 30.9*  INR 1.79* 2.72* 3.11*  CREATININE 1.78* 1.73* 1.78*    Estimated Creatinine Clearance: 19.3 ml/min (by C-G formula based on Cr of 1.78).   Medications:  Scheduled:  . azithromycin  500 mg Oral Daily  . budesonide-formoterol  2 puff Inhalation BID  . cycloSPORINE  1 drop Both Eyes BID  . diltiazem  120 mg Oral Daily  . ferrous sulfate  325 mg Oral Q breakfast  . levothyroxine  88 mcg Oral QPM  . metoprolol  100 mg Oral BID  . pantoprazole  80 mg Oral Q1200  . psyllium  1 packet Oral Daily  . Warfarin - Pharmacist Dosing Inpatient   Does not apply q1800    Assessment: 80 y.o F presented with SOB for one week 3/9 on Coumadin from PTA for Afib. Coumadin con't from PTA for Afib, INR 2.4 on admit (4mg  daily except 6mg  on Tues PTA) INR now 3.11 Patient also on Azithromycin (Day#7).   Hgb 10.0, HCT 28.7, Plts 197, no bleeding noted.  Goal of Therapy:  INR 2-3 Monitor platelets by anticoagulation protocol: Yes   Plan:  - Residual effects of 8mg  and 10mg  Arrants still be present. Will hold Coumadin x 1 tonight and resume tomorrow.  - Daily INR - Monitor s/s bleeding - Azithromycin Day #7, completed course?  Oval Linsey. Leitha Schuller, PharmD Clinical Pharmacist - Resident Phone: [phone] Pager: [phone] 06/15/2013 9:27 AM

## 2013-06-16 ENCOUNTER — Telehealth: Payer: Self-pay | Admitting: Cardiology

## 2013-06-16 ENCOUNTER — Encounter (HOSPITAL_COMMUNITY): Payer: Self-pay | Admitting: Cardiology

## 2013-06-16 DIAGNOSIS — J189 Pneumonia, unspecified organism: Secondary | ICD-10-CM | POA: Diagnosis present

## 2013-06-16 DIAGNOSIS — I5031 Acute diastolic (congestive) heart failure: Secondary | ICD-10-CM | POA: Diagnosis not present

## 2013-06-16 DIAGNOSIS — Z950 Presence of cardiac pacemaker: Secondary | ICD-10-CM | POA: Diagnosis not present

## 2013-06-16 DIAGNOSIS — I4891 Unspecified atrial fibrillation: Secondary | ICD-10-CM | POA: Diagnosis not present

## 2013-06-16 DIAGNOSIS — Z7901 Long term (current) use of anticoagulants: Secondary | ICD-10-CM

## 2013-06-16 DIAGNOSIS — I4892 Unspecified atrial flutter: Secondary | ICD-10-CM | POA: Diagnosis not present

## 2013-06-16 DIAGNOSIS — I119 Hypertensive heart disease without heart failure: Secondary | ICD-10-CM

## 2013-06-16 DIAGNOSIS — I10 Essential (primary) hypertension: Secondary | ICD-10-CM

## 2013-06-16 LAB — BASIC METABOLIC PANEL
BUN: 33 mg/dL — AB (ref 6–23)
CALCIUM: 8.7 mg/dL (ref 8.4–10.5)
CO2: 28 meq/L (ref 19–32)
Chloride: 95 mEq/L — ABNORMAL LOW (ref 96–112)
Creatinine, Ser: 1.69 mg/dL — ABNORMAL HIGH (ref 0.50–1.10)
GFR calc Af Amer: 30 mL/min — ABNORMAL LOW (ref >90)
GFR, EST NON AFRICAN AMERICAN: 26 mL/min — AB (ref >90)
Glucose, Bld: 100 mg/dL — ABNORMAL HIGH (ref 70–99)
Potassium: 3.7 mEq/L (ref 3.7–5.3)
Sodium: 134 mEq/L — ABNORMAL LOW (ref 137–147)

## 2013-06-16 LAB — PROTIME-INR
INR: 2.43 — ABNORMAL HIGH (ref 0.00–1.49)
Prothrombin Time: 25.6 seconds — ABNORMAL HIGH (ref 11.6–15.2)

## 2013-06-16 MED ORDER — WARFARIN SODIUM 4 MG PO TABS
4.0000 mg | ORAL_TABLET | Freq: Once | ORAL | Status: DC
  Filled 2013-06-16: qty 1

## 2013-06-16 MED ORDER — DILTIAZEM HCL ER COATED BEADS 120 MG PO CP24: 120.0000 mg | ORAL_CAPSULE | Freq: Every day | ORAL | Status: DC

## 2013-06-16 MED ORDER — FUROSEMIDE 20 MG PO TABS: 20.0000 mg | ORAL_TABLET | Freq: Every day | ORAL | Status: DC

## 2013-06-16 MED ORDER — METOPROLOL TARTRATE 100 MG PO TABS: 100.0000 mg | ORAL_TABLET | Freq: Two times a day (BID) | ORAL | Status: DC

## 2013-06-16 MED ORDER — FUROSEMIDE 20 MG PO TABS
20.0000 mg | ORAL_TABLET | Freq: Every day | ORAL | Status: DC
  Administered 2013-06-16: 20 mg via ORAL
  Filled 2013-06-16: qty 1

## 2013-06-16 NOTE — Progress Notes (Signed)
Physical Therapy Treatment Patient Details Name: Kaysia Willard Kincy MRN: [id number] DOB: 12/19/1921 Today's Date: 06/16/2013 Time: 3762-8315 PT Time Calculation (min): 24 min  PT Assessment / Plan / Recommendation  History of Present Illness Pleasant 80 y/o female with a history of atrial fibrillation. She had been in NSR on low dose Amiodarone in the past but apparently there was a question of this causing worsening of her macular degeneration and the pt stopped it. She has been in AF with CVR on past few office visits (she saw Dr Lovena Le 05/13/13). She had a MDT pacemaker implanted in Oct 2005 and Dr Lovena Le is following. CHF   PT Comments   Pt with improved mobility today able to tolerate gait with HR 92-103 and sats 89-97% on RA throughout with cues for energy conservation and pursed lip breathing. Pt encouraged to continue walking daily and performing HEP. Will follow.   Follow Up Recommendations  Home health PT     Does the patient have the potential to tolerate intense rehabilitation     Barriers to Discharge        Equipment Recommendations       Recommendations for Other Services    Frequency     Progress towards PT Goals Progress towards PT goals: Progressing toward goals  Plan Current plan remains appropriate    Precautions / Restrictions Precautions Precautions: Fall   Pertinent Vitals/Pain No pain   Mobility  Transfers Overall transfer level: Modified independent Ambulation/Gait Ambulation/Gait assistance: Supervision Ambulation Distance (Feet): 500 Feet Assistive device: Rolling walker (2 wheeled) Gait Pattern/deviations: Step-through pattern;Decreased stride length Gait velocity interpretation: Below normal speed for age/gender General Gait Details: cues for breathing technique, position in RW and posture as well as need for standing rest grossly every 25' to bring O2 sat back into the 90's pt drops to 89% with ambulation but quickly recovers with standing Stairs:  Yes Stairs assistance: Modified independent (Device/Increase time) Stair Management: One rail Right;Step to pattern;Sideways Number of Stairs: 3    Exercises General Exercises - Lower Extremity Long Arc Quad: AROM;Seated;Both;20 reps Hip Flexion/Marching: AROM;Seated;Both;20 reps Toe Raises: AROM;Seated;Both;20 reps Heel Raises: AROM;Seated;Both;20 reps   PT Diagnosis:    PT Problem List:   PT Treatment Interventions:     PT Goals (current goals can now be found in the care plan section)    Visit Information  Last PT Received On: 06/16/13 Assistance Needed: +1 History of Present Illness: Pleasant 80 y/o female with a history of atrial fibrillation. She had been in NSR on low dose Amiodarone in the past but apparently there was a question of this causing worsening of her macular degeneration and the pt stopped it. She has been in AF with CVR on past few office visits (she saw Dr Lovena Le 05/13/13). She had a MDT pacemaker implanted in Oct 2005 and Dr Lovena Le is following. CHF    Subjective Data      Cognition  Cognition Arousal/Alertness: Awake/alert Behavior During Therapy: WFL for tasks assessed/performed Overall Cognitive Status: Within Functional Limits for tasks assessed    Balance     End of Session PT - End of Session Activity Tolerance: Patient tolerated treatment well Patient left: in chair;with call bell/phone within reach Nurse Communication: Mobility status   GP     Lanetta Inch [facility] 06/16/2013, 11:13 AM Elwyn Reach, [place]

## 2013-06-16 NOTE — Progress Notes (Signed)
ANTICOAGULATION CONSULT NOTE - Follow Up Consult  Pharmacy Consult for Warfarin Indication: atrial fibrillation  No Known Allergies  Patient Measurements: Height: 5\' 5"  (165.1 cm) Weight: 135 lb 11.2 oz (61.553 kg) (b scale) IBW/kg (Calculated) : 57  Vital Signs: Temp: 97.5 F (36.4 C) (03/16 0636) Temp src: Oral (03/16 0636) BP: 110/51 mmHg (03/16 0636) Pulse Rate: 68 (03/16 0636)  Labs:  Recent Labs  06/14/13 0522 06/15/13 0404 06/16/13 0340  LABPROT 27.9* 30.9* 25.6*  INR 2.72* 3.11* 2.43*  CREATININE 1.73* 1.78* 1.69*    Estimated Creatinine Clearance: 20.3 ml/min (by C-G formula based on Cr of 1.69).   Medications:  Scheduled:  . azithromycin  500 mg Oral Daily  . budesonide-formoterol  2 puff Inhalation BID  . cycloSPORINE  1 drop Both Eyes BID  . diltiazem  120 mg Oral Daily  . ferrous sulfate  325 mg Oral Q breakfast  . furosemide  20 mg Oral Daily  . levothyroxine  88 mcg Oral QPM  . metoprolol  100 mg Oral BID  . pantoprazole  80 mg Oral Q1200  . psyllium  1 packet Oral Daily  . Warfarin - Pharmacist Dosing Inpatient   Does not apply q1800    Assessment: 80 y.o F presented with SOB for one week 3/9 on Coumadin from PTA for Afib. Coumadin con't from PTA for Afib, INR 2.4 on admit (4mg  daily except 6mg  on Tues PTA)   INR now therapeutic  Goal of Therapy:  INR 2-3 Monitor platelets by anticoagulation protocol: Yes   Plan:  - Coumadin 4 mg po x 1 today  - Daily INR - Monitor s/s bleeding  Thank you. Anette Guarneri, PharmD [phone]  06/16/2013 10:01 AM

## 2013-06-16 NOTE — Progress Notes (Signed)
06/16/13 -Patient 99% on 1 liter of oxygen while sitting.  97% on room air while sitting. 93% on room air on exertion while ambulating in the hallway. Patient ambulated the length of hallway back to room with physical therapy and oxygen saturation remained between 89-97%.  Catha Gosselin RN

## 2013-06-16 NOTE — Progress Notes (Signed)
[name]  MRN: [id number] DOB: 1921/11/11 Admit Date: 06/09/2013  80 y/o female with a history of atrial fibrillation. She had been in NSR on low dose Amiodarone in the past but apparently there was a question of this causing worsening of her macular degeneration and the pt stopped it. She has been in AF with CVR on past few office visits (she saw Dr Lovena Le 05/13/13). She had a MDT pacemaker implanted in Oct 2005 and Dr Lovena Le is following.  She presented to Dr Verlene Mayer office this 06/09/08 with complaints of dyspnea for one week.  Noted to be Afib with mild RVR & with likely Acute Diastolic HF.   Subjective:  Feels well this AM - just has not done much yet.  Only OOB to bathroom.  Ready for her bath, but wants to go home.  Objective:  Vital Signs in the last 24 hours: Temp:  [97 F (36.1 C)-98.1 F (36.7 C)] 97.5 F (36.4 C) (03/16 0636) Pulse Rate:  [68-98] 68 (03/16 0636) Resp:  [18-20] 18 (03/16 0636) BP: (96-136)/(46-70) 110/51 mmHg (03/16 0636) SpO2:  [98 %-100 %] 100 % (03/16 0636) Weight:  [135 lb 11.2 oz (61.553 kg)] 135 lb 11.2 oz (61.553 kg) (03/16 0636)  Intake/Output from previous day: 03/15 0701 - 03/16 0700 In: 33 [P.O.:990] Out: -  Intake/Output from this shift:    Physical Exam: General appearance: alert, cooperative, appears stated age and no distress Neck: no adenopathy, no carotid bruit, no JVD and supple, symmetrical, trachea midline Lungs: clear to auscultation bilaterally, normal percussion bilaterally and non-labored, mild basal crackles that clear with cough. Heart: irregularly irregular rhythm and Normal S1 &2, no M/R/G Abdomen: soft, non-tender; bowel sounds normal; no masses,  no organomegaly Extremities: extremities normal, atraumatic, no cyanosis or edema Pulses: 2+ and symmetric Neurologic: Grossly normal  Lab Results: No results found for this basename: WBC, HGB, PLT,  in the last 72 hours  Recent Labs  06/15/13 0404 06/16/13 0340  NA  136* 134*  K 3.3* 3.7  CL 94* 95*  CO2 25 28  GLUCOSE 102* 100*  BUN 34* 33*  CREATININE 1.78* 1.69*   Imaging: No new findings.  Cardiac Studies: Echo reviewed - recorded in [place]. Normal EF.  Tele - Afib with CVR.  Assessment/Plan:  Principal Problem:   Acute diastolic heart failure Active Problems:   HYPERTENSION   Chronic atrial fibrillation   Long term current use of anticoagulant   Chronic renal insufficiency, stage III (moderate)   Dyspnea on exertion   Hypertensive cardiovascular disease   Pneumonia, organism unspecified; Question of   CARDIAC PACEMAKER IN SITU- MDT 2005   GERD   Hypothyroidism   Acute diastolic congestive heart failure  Obvious diuresis,but not accurately recorded.  Renal Fxn stable. -- not currently on Lasix, would consider basal dose of ~20 mg PO for d/c with Sliding Scale Lasix instructions:  Sliding scale Lasix: Weigh yourself when you get home, then Daily in the Morning. **Your dry weight will be what your scale says on the day you return home.(here is 135 lbs.).   If you gain more than 3 pounds from dry weight: Increase the Lasix dosing to 40 mg in the morning until weight returns to baseline dry weight.  If weight gain is greater than 5 pounds in 2 days: Increased to Lasix 40 mg in AM & 20 mg in PM and contact the office for further assistance if weight does not go down the next day.  If the weight goes down more than 3 pounds from dry weight: Hold Lasix until it returns to baseline dry weight  HR much improved - but has yet to ambulate. On BB & CCB; resting rate in 80s-90s ? PNA - complete 7 days total Rx of Azithromycin today. (initialy Rx with Rocephin). INR stable.  Needs to ambulate today to evaluate SaO2 & HR response.  Anticipate that she could d/c later today vs. Tomorrow AM. Will need CMP check in ~2-3 days with ProBNP.  TCM 10 f/u with PA/NP or Primary Cardiologist (Dr. Martinique).   LOS: 7 days    HARDING,DAVID W 06/16/2013, 9:22  AM

## 2013-06-16 NOTE — Telephone Encounter (Signed)
TCM     TCM appointment scheduled for 3/25 with Richardson Dopp PA-C

## 2013-06-16 NOTE — Discharge Summary (Signed)
Physician Discharge Summary  Patient ID: [name] MRN: [id number] DOB/AGE: February 29, 1922 80 y.o.  Primary Cardiologist: Dr. Percival Spanish Dr. Lovena Le Primary Physician: Dr. Tedra Senegal   Admit date: 06/09/2013 Discharge date: 06/16/2013  Admission Diagnoses: Acute on chronic diastolic heart failure  Discharge Diagnoses:  Principal Problem:   Acute diastolic heart failure Active Problems:   HYPERTENSION   Chronic atrial fibrillation   GERD   CARDIAC PACEMAKER IN SITU- MDT 2005   Long term current use of anticoagulant   Hypothyroidism   Chronic renal insufficiency, stage III (moderate)   Dyspnea on exertion   Acute diastolic congestive heart failure   Hypertensive cardiovascular disease   Pneumonia, organism unspecified; Question of   Discharged Condition: stable  Hospital Course: The patient is a  80 y/o female with a history of atrial fibrillation, on chronic warfarin for anticoagulation. She had been in NSR on low dose Amiodarone in the past but apparently there was a question of this causing worsening of her macular degeneration and the pt stopped it. She has been in AF with CVR on past few office visits (she saw Dr Lovena Le 05/13/13). She had a MDT pacemaker implanted in Oct 2005 and Dr Lovena Le is following. Dr. Percival Spanish serves as her primary cardiologist.   She presented to her PCP, Dr Verlene Mayer office, the morning of 06/09/13 with complaints of dyspnea for one week. She denied chest pain, cough, fever, and orthopnea. An office EKG demonstrated atrial fibrillation with a rapid ventricular response. Dr Renold Genta was also concerned that she Litzinger have CHF. She was sent to the [facility] ER for further evaluation. On arrival, the pt's HR was in 100-110 range. TSH was WNL. She also had a low grade temp of 99 F, pro BNP was elevated at 8505. Her CXR suggested CHF vs pneumonia.  A 2D echo was obtained which demonstrated normal systolic function with an EF of 50-55%. Wall motion was normal; there were no  regional wall motion abnormalities. The study was not technically sufficient to allow evaluation of LV diastolic dysfunction due to atrial fibrillation.  She was admitted by Dr. Debara Pickett, primarily for CHF and management of atrial fibrillation however she was also treated empirically for possible CAP. She was started on Rochepin initially, then was transitioned to Azithromycin. She completed a 7 day course of antibiotics. IV Lasix was given for diuresis.  Her Afib was managed medically with rate control therapy. Her Metoprolol was increased from 50 mg to 100 mg BID. PO Cardizem, 120 mg daily, was also added. She was continued on Warfarin for anticoagulation. Her INR was monitored carefully, in the setting of concomitant antibiotic therapy. INR remained within therapeutic range. With the above measures, she had improvement in her symptoms. Rate control was achieved with a resting rate in the 80-90s. She diuresed down to a dry weight of 135 lbs. She was evaluated by Physical Therapy. She did well with ambulation, w/o SOB and w/o becoming hypoxic. However, home PT was recommended to help with strength and conditioning. On hospital day 7, she was seen and examined by Dr. Ellyn Hack, who determined she was stable for discharge home. An order for Home Health PT was placed. She was discharged home on 20 mg of daily Lasix. She was provided detailed instructions on how to adjust lasix dose based on daily weight. TCM f/u has been scheduled. She will receive a telephone call in 2-3 days to assess her transition back to home. She is scheduled to follow up in clinic with  Richardson Dopp, PA-C, on 06/25/13. She is also scheduled for an INR check on 3/18. INR day of discharge was therapeutic at 2.43.   Consults: None  Significant Diagnostic Studies:   2D echocardiogram 06/10/13 Study Conclusions  - Left ventricle: The cavity size was normal. Wall thickness was normal. Systolic function was normal. The estimated ejection fraction  was in the range of 50% to 55%. Wall motion was normal; there were no regional wall motion abnormalities. - Mitral valve: Calcified annulus. Mild regurgitation. - Right ventricle: The cavity size was mildly dilated. - Pulmonary arteries: PA peak pressure: 94mm Hg (S).   Treatments: See Hospital   Discharge Exam: Blood pressure 110/70, pulse 98, temperature 97.5 F (36.4 C), temperature source Oral, resp. rate 20, height 5\' 5"  (1.651 m), weight 135 lb 11.2 oz (61.553 kg), SpO2 95.00%.   Disposition: 01-Home or Self Care      Discharge Orders   Future Appointments Provider Department Dept Phone   06/18/2013 12:15 PM [facility] [place] Office [phone]   06/19/2013 12:10 PM [facility] [employer] Office [phone]   06/25/2013 8:50 AM [facility] [place] Office [phone]   08/14/2013 9:35 AM [facility] [phone]   Future Orders Complete By Expires   Diet - low sodium heart healthy  As directed    Face-to-face encounter (required for Medicare/Medicaid patients)  As directed    Comments:     I Aceton Kinnear certify that this patient is under my care and that I, or a nurse practitioner or physician's assistant working with me, had a face-to-face encounter that meets the physician face-to-face encounter requirements with this patient on 06/16/2013. The encounter with the patient was in whole, or in part for the following medical condition(s) which is the primary reason for home health care (List medical condition): heart failure + deconditioned.   Questions:     The encounter with the patient was in whole, or in part, for the following medical condition, which is the primary reason for home health care:  deconditioned   I certify that, based on my findings, the following services are medically necessary home health services:  Physical therapy   My  clinical findings support the need for the above services:  Unsafe ambulation due to balance issues   Further, I certify that my clinical findings support that this patient is homebound due to:  Unable to leave home safely without assistance   Reason for Medically Necessary Home Health Services:  Therapy- Home [facility] to [place]  As directed    Questions:     To provide the following care/treatments:  PT   Increase activity slowly  As directed        Medication List         ALPRAZolam 0.5 MG tablet  Commonly known as:  XANAX  Take 0.5 mg by mouth at bedtime.     diltiazem 120 MG 24 hr capsule  Commonly known as:  CARDIZEM CD  Take 1 capsule (120 mg total) by mouth daily.     esomeprazole 40 MG capsule  Commonly known as:  NEXIUM  Take 1 capsule (40 mg total) by mouth daily before breakfast.     ferrous sulfate 325 (65 FE) MG tablet  Take 325 mg by mouth daily with breakfast.     Fluticasone-Salmeterol 250-50 MCG/DOSE Aepb  Commonly known as:  ADVAIR DISKUS  Inhale 1 puff into the lungs every 12 (twelve) hours.     furosemide 20 MG tablet  Commonly known as:  LASIX  Take 1 tablet (20 mg total) by mouth daily.     ICAPS Caps  Take 1 capsule by mouth 2 (two) times daily.     levothyroxine 88 MCG tablet  Commonly known as:  SYNTHROID, LEVOTHROID  Take 88 mcg by mouth every evening.     metoprolol 100 MG tablet  Commonly known as:  LOPRESSOR  Take 1 tablet (100 mg total) by mouth 2 (two) times daily.     psyllium 58.6 % powder  Commonly known as:  METAMUCIL  Take 1 packet by mouth every evening.     RESTASIS 0.05 % ophthalmic emulsion  Generic drug:  cycloSPORINE  Place 1 drop into both eyes 2 (two) times daily.     Vitamin D3 1000 UNITS Caps  Take 1 capsule by mouth daily.     warfarin 4 MG tablet  Commonly known as:  COUMADIN  Take 4-6 mg by mouth daily. Take 4mg  daily except 6mg  on Tuesdays       Follow-up Information   Follow  up with [place].   Contact information:   [address] [place] [address] [phone]       Follow up with Richardson Dopp, PA-C On 06/25/2013. (8:50 am )    Specialty:  Physician Assistant   Contact information:   [id number] [place] [address] [phone]      TIME SPENT ON DISCHARGE, INCLUDING PHYSICIAN TIME: >30 MINUTES  Signed: Lyda Jester 06/16/2013, 12:38 PM

## 2013-06-16 NOTE — Discharge Summary (Signed)
I personally rounded on the patient this morning. She was doing quite well from a CHF standpoint. Recently converted to oral diuretic. As per the weekend rounding notes, the patient is essentially rate for discharge. I agree with Ms. Simmons'discharge summary. I think should probably be on low dose of diuretic that can be titrated according to the sliding scale protocol in my last note. She is essentially stable for discharge with planned followup as noted in the discharge summary.   Leonie Man, MD

## 2013-06-17 NOTE — Telephone Encounter (Signed)
Patient contacted regarding discharge from [facility] on 06/09/13.  Patient understands to follow up with provider Richardson Dopp PA on 06/19/13 at 12:10 PM at [facility] street office. Patient understands discharge instructions? yes Patient understands medications and regiment? yes Patient understands to bring all medications to this visit? yes  Pt has two F/U appointments with scott weaver PA. The 3/25 appointment was cancelled pt will come on 06/19/13 at 12:10 PM. Pt's son is aware.

## 2013-06-18 ENCOUNTER — Ambulatory Visit (INDEPENDENT_AMBULATORY_CARE_PROVIDER_SITE_OTHER): Payer: Medicare Other | Admitting: *Deleted

## 2013-06-18 DIAGNOSIS — I4891 Unspecified atrial fibrillation: Secondary | ICD-10-CM | POA: Diagnosis not present

## 2013-06-18 DIAGNOSIS — I4892 Unspecified atrial flutter: Secondary | ICD-10-CM | POA: Diagnosis not present

## 2013-06-18 DIAGNOSIS — Z5181 Encounter for therapeutic drug level monitoring: Secondary | ICD-10-CM

## 2013-06-18 DIAGNOSIS — H353190 Nonexudative age-related macular degeneration, unspecified eye, stage unspecified: Secondary | ICD-10-CM | POA: Diagnosis not present

## 2013-06-18 DIAGNOSIS — I482 Chronic atrial fibrillation, unspecified: Secondary | ICD-10-CM

## 2013-06-18 DIAGNOSIS — Z7901 Long term (current) use of anticoagulants: Secondary | ICD-10-CM | POA: Diagnosis not present

## 2013-06-18 DIAGNOSIS — H43819 Vitreous degeneration, unspecified eye: Secondary | ICD-10-CM | POA: Diagnosis not present

## 2013-06-18 LAB — POCT INR: INR: 1.8

## 2013-06-19 ENCOUNTER — Encounter: Payer: Self-pay | Admitting: Physician Assistant

## 2013-06-19 ENCOUNTER — Ambulatory Visit (INDEPENDENT_AMBULATORY_CARE_PROVIDER_SITE_OTHER): Payer: Medicare Other | Admitting: Physician Assistant

## 2013-06-19 VITALS — BP 120/80 | HR 83 | Ht 65.0 in | Wt 141.0 lb

## 2013-06-19 DIAGNOSIS — I4891 Unspecified atrial fibrillation: Secondary | ICD-10-CM

## 2013-06-19 DIAGNOSIS — I10 Essential (primary) hypertension: Secondary | ICD-10-CM

## 2013-06-19 DIAGNOSIS — I5032 Chronic diastolic (congestive) heart failure: Secondary | ICD-10-CM | POA: Diagnosis not present

## 2013-06-19 DIAGNOSIS — I509 Heart failure, unspecified: Secondary | ICD-10-CM | POA: Diagnosis not present

## 2013-06-19 DIAGNOSIS — H9190 Unspecified hearing loss, unspecified ear: Secondary | ICD-10-CM | POA: Diagnosis not present

## 2013-06-19 DIAGNOSIS — H3530 Unspecified macular degeneration: Secondary | ICD-10-CM | POA: Diagnosis not present

## 2013-06-19 DIAGNOSIS — I110 Hypertensive heart disease with heart failure: Secondary | ICD-10-CM | POA: Diagnosis not present

## 2013-06-19 DIAGNOSIS — N183 Chronic kidney disease, stage 3 unspecified: Secondary | ICD-10-CM | POA: Diagnosis not present

## 2013-06-19 DIAGNOSIS — N189 Chronic kidney disease, unspecified: Secondary | ICD-10-CM | POA: Diagnosis not present

## 2013-06-19 DIAGNOSIS — M1710 Unilateral primary osteoarthritis, unspecified knee: Secondary | ICD-10-CM | POA: Diagnosis not present

## 2013-06-19 DIAGNOSIS — Z602 Problems related to living alone: Secondary | ICD-10-CM | POA: Diagnosis not present

## 2013-06-19 DIAGNOSIS — I5031 Acute diastolic (congestive) heart failure: Secondary | ICD-10-CM | POA: Diagnosis not present

## 2013-06-19 DIAGNOSIS — Z7901 Long term (current) use of anticoagulants: Secondary | ICD-10-CM | POA: Diagnosis not present

## 2013-06-19 LAB — BASIC METABOLIC PANEL
BUN: 29 mg/dL — ABNORMAL HIGH (ref 6–23)
CALCIUM: 8.9 mg/dL (ref 8.4–10.5)
CO2: 26 meq/L (ref 19–32)
CREATININE: 1.9 mg/dL — AB (ref 0.4–1.2)
Chloride: 96 mEq/L (ref 96–112)
GFR: 26.77 mL/min — AB (ref >60.00)
GLUCOSE: 108 mg/dL — AB (ref 70–99)
Potassium: 3.2 mEq/L — ABNORMAL LOW (ref 3.5–5.1)
Sodium: 135 mEq/L (ref 135–145)

## 2013-06-19 MED ORDER — FUROSEMIDE 20 MG PO TABS: 20.0000 mg | ORAL_TABLET | Freq: Every day | ORAL | Status: DC

## 2013-06-19 NOTE — Progress Notes (Signed)
[name], [name],   [id number] Phone: [phone] Fax:  [phone]  Date:  06/19/2013   ID:  [name], DOB 02/27/1922, MRN [id number]  PCP:  Michaela Showers, MD  Cardiologist:  Dr. Minus Breeding   Electrophysiologist:  Dr. Cristopher Peru    History of Present Illness: [name] is a 80 y.o. female with a history of permanent atrial fibrillation (previously on amiodarone-stopped due to macular degeneration), symptomatic bradycardia status post pacemaker, atrial flutter status post ablation, HTN, CKD. She was admitted 2/5-8/52 with a/c diastolic CHF in the setting of atrial fibrillation with RVR and possible community-acquired pneumonia. She was treated with antibiotics. She was diuresed with IV Lasix. Rate controlling medications were adjusted.  She is feeling better. She denies any significant dyspnea. She denies chest pain. She denies orthopnea, PND or edema. She denies syncope. Her strength is improving.  Echo (06/10/13): EF 50-55%, normal wall motion, MAC, mild MR, mild RPE, PASP 40.   Recent Labs: 08/30/2012: HDL Cholesterol 58; LDL (calc) 88  06/09/2013: ALT 28  06/10/2013: Hemoglobin 10.0*; TSH 0.542  06/15/2013: Pro B Natriuretic peptide (BNP) 4062.0*  06/16/2013: Creatinine 1.69*; Potassium 3.7   Wt Readings from Last 3 Encounters:  06/19/13 141 lb (63.957 kg)  06/16/13 135 lb 11.2 oz (61.553 kg)  06/09/13 148 lb 8 oz (67.359 kg)     Past Medical History  Diagnosis Date  . Symptomatic sinus bradycardia     s/p pacemaker  . Atrial flutter     s/p RFCA  . Atrial fibrillation     On warfarin  . Hypothyroidism   . GERD (gastroesophageal reflux disease)   . Esophageal stricture   . Anemia   . HTN (hypertension)     a.  echo 3/06: EF 55-65%, mild LVH, mild RVH  . Asthma   . Tremor, essential   . Diverticulosis   . Vertigo   . Urinary, incontinence, stress female   . DJD (degenerative joint disease)     knees  . Renal insufficiency     . Hearing loss   . Macular degeneration     Current Outpatient Prescriptions  Medication Sig Dispense Refill  . ALPRAZolam (XANAX) 0.5 MG tablet Take 0.5 mg by mouth at bedtime.      . Cholecalciferol (VITAMIN D3) 1000 UNITS CAPS Take 1 capsule by mouth daily.       . cycloSPORINE (RESTASIS) 0.05 % ophthalmic emulsion Place 1 drop into both eyes 2 (two) times daily.       Marland Kitchen diltiazem (CARDIZEM CD) 120 MG 24 hr capsule Take 1 capsule (120 mg total) by mouth daily.  30 capsule  5  . esomeprazole (NEXIUM) 40 MG capsule Take 1 capsule (40 mg total) by mouth daily before breakfast.  90 capsule  3  . ferrous sulfate 325 (65 FE) MG tablet Take 325 mg by mouth daily with breakfast.        . Fluticasone-Salmeterol (ADVAIR DISKUS) 250-50 MCG/DOSE AEPB Inhale 1 puff into the lungs every 12 (twelve) hours.  60 each  5  . furosemide (LASIX) 20 MG tablet Take 1 tablet (20 mg total) by mouth daily.  30 tablet  5  . levothyroxine (SYNTHROID, LEVOTHROID) 88 MCG tablet Take 88 mcg by mouth every evening.      . metoprolol (LOPRESSOR) 100 MG tablet Take 1 tablet (100 mg total) by mouth 2 (two) times daily.  60 tablet  5  . Multiple Vitamins-Minerals (ICAPS) CAPS  Take 1 capsule by mouth 2 (two) times daily.       . psyllium (METAMUCIL) 58.6 % powder Take 1 packet by mouth every evening.       . warfarin (COUMADIN) 4 MG tablet Take 4-6 mg by mouth daily. Take 4mg  daily except 6mg  on Tuesdays       Current Facility-Administered Medications  Medication Dose Route Frequency Provider Last Rate Last Dose  . TDaP (BOOSTRIX) injection 0.5 mL  0.5 mL Intramuscular Once Michaela Showers, MD        Allergies:   Review of patient's allergies indicates no known allergies.   Social History:  The patient  reports that she quit smoking about 51 years ago. Her smoking use included Cigarettes. She smoked 0.00 packs per day. She has never used smokeless tobacco. She reports that she drinks alcohol. She reports that she does not  use illicit drugs.   Family History:  The patient's family history includes Cancer in her father and mother; Cirrhosis in her brother.   ROS:  Please see the history of present illness.      All other systems reviewed and negative.   PHYSICAL EXAM: VS:  BP 120/80  Pulse 83  Ht 5\' 5"  (1.651 m)  Wt 141 lb (63.957 kg)  BMI 23.46 kg/m2 Well nourished, well developed, in no acute distress HEENT: normal Neck: no JVD Cardiac:  normal S1, S2; irregularly irregular rhythm; no murmur Lungs:  clear to auscultation bilaterally, no wheezing, rhonchi or rales Abd: soft, nontender, no hepatomegaly Ext: no edema Skin: warm and dry Neuro:  CNs 2-12 intact, no focal abnormalities noted  EKG:  Atrial fibrillation, HR 83, normal axis, intermittent V. pacing     ASSESSMENT AND PLAN:  1. Chronic Diastolic CHF: Volume appears stable. Check follow up basic metabolic panel today. Continue current dose of Lasix. 2. Atrial Fibrillation: Rate is controlled. She continues to tolerate Coumadin. Continue current therapy. 3. Chronic Kidney Disease: Check follow up basic metabolic panel today. 4. Hypertension: Controlled. 5. Status Post Pacemaker: Follow up with EP planned. 6. Community-Acquired Pneumonia: Resolved. Follow up with primary care as indicated. 7. Disposition: Follow up with me or Dr. Minus Breeding in one month.  Signed, Richardson Dopp, PA-C  06/19/2013 12:55 PM

## 2013-06-19 NOTE — Patient Instructions (Signed)
Labs today:  BMET (Dx 428.32, 585.9)  Schedule follow up office visit with Dr. Minus Breeding or Richardson Dopp, PA-C in 3-4 weeks.

## 2013-06-20 ENCOUNTER — Telehealth: Payer: Self-pay | Admitting: *Deleted

## 2013-06-20 DIAGNOSIS — I5031 Acute diastolic (congestive) heart failure: Secondary | ICD-10-CM

## 2013-06-20 DIAGNOSIS — I10 Essential (primary) hypertension: Secondary | ICD-10-CM

## 2013-06-20 DIAGNOSIS — I5032 Chronic diastolic (congestive) heart failure: Secondary | ICD-10-CM

## 2013-06-20 MED ORDER — FUROSEMIDE 20 MG PO TABS: ORAL_TABLET | ORAL | Status: DC

## 2013-06-20 MED ORDER — POTASSIUM CHLORIDE ER 20 MEQ PO TBCR: 20.0000 meq | EXTENDED_RELEASE_TABLET | ORAL | Status: DC

## 2013-06-20 NOTE — Telephone Encounter (Signed)
s/w pt's daughter due to no answer at pt's home #, she asked for me to please s/w her brother, (pt's son) [name]. She took my name and # for ptcb for lab results and med changes

## 2013-06-20 NOTE — Telephone Encounter (Signed)
pt's daughter cb and said he mother was out so she will take the information down to make the changes. bmet 3/25, decrease lasix 20 mg Mon, Wed, Fri's; start K+ with today's dose 40 meq, then 20 meq on Mon, Wed, Fri's. Weight daily call if up 3# x 1 day

## 2013-06-23 ENCOUNTER — Telehealth: Payer: Self-pay | Admitting: Cardiology

## 2013-06-23 DIAGNOSIS — I5032 Chronic diastolic (congestive) heart failure: Secondary | ICD-10-CM

## 2013-06-23 DIAGNOSIS — H3530 Unspecified macular degeneration: Secondary | ICD-10-CM | POA: Diagnosis not present

## 2013-06-23 DIAGNOSIS — I10 Essential (primary) hypertension: Secondary | ICD-10-CM

## 2013-06-23 DIAGNOSIS — N183 Chronic kidney disease, stage 3 unspecified: Secondary | ICD-10-CM | POA: Diagnosis not present

## 2013-06-23 DIAGNOSIS — I5031 Acute diastolic (congestive) heart failure: Secondary | ICD-10-CM

## 2013-06-23 DIAGNOSIS — I4891 Unspecified atrial fibrillation: Secondary | ICD-10-CM | POA: Diagnosis not present

## 2013-06-23 DIAGNOSIS — I509 Heart failure, unspecified: Secondary | ICD-10-CM | POA: Diagnosis not present

## 2013-06-23 DIAGNOSIS — I110 Hypertensive heart disease with heart failure: Secondary | ICD-10-CM | POA: Diagnosis not present

## 2013-06-23 MED ORDER — POTASSIUM CHLORIDE ER 20 MEQ PO TBCR: 20.0000 meq | EXTENDED_RELEASE_TABLET | ORAL | Status: DC

## 2013-06-23 MED ORDER — FUROSEMIDE 20 MG PO TABS: ORAL_TABLET | ORAL | Status: DC

## 2013-06-23 NOTE — Telephone Encounter (Signed)
New message     Pt c/o sob and wt gain of 4lbs from sat to sun.  Ankles are more swollen. Please advise

## 2013-06-23 NOTE — Telephone Encounter (Signed)
Will forward to Richardson Dopp, [place] and Julaine Hua for review and recommendations since they have been treating her and Dr Percival Spanish is not in the office.

## 2013-06-23 NOTE — Telephone Encounter (Signed)
I lm for [facility] as to new directions for lasix and K+, I will cb [facility] 3/24. I did s/w pt's daughter about changes. We went over the  changes several times until daughter was comfortable with the new directions. I stated to pt's daughter that I had lm for [facility] about new directions and need bmet 1 week and that I will cb [facility] tomorrow to advise of changes. [name] said thank you.

## 2013-06-23 NOTE — Telephone Encounter (Signed)
Take extra Lasix 20 mg today. Take Lasix 20 mg along K+ 20 mEq tomorrow. Then change Lasix and K+ to every other day instead of Mon, Wed, Fri. Repeat BMET in 1 week. Richardson Dopp, PA-C   06/23/2013 4:43 PM

## 2013-06-24 ENCOUNTER — Encounter: Payer: Medicare Other | Admitting: Cardiology

## 2013-06-25 ENCOUNTER — Encounter: Payer: Medicare Other | Admitting: Physician Assistant

## 2013-06-25 ENCOUNTER — Other Ambulatory Visit (INDEPENDENT_AMBULATORY_CARE_PROVIDER_SITE_OTHER): Payer: Medicare Other

## 2013-06-25 ENCOUNTER — Ambulatory Visit (INDEPENDENT_AMBULATORY_CARE_PROVIDER_SITE_OTHER): Payer: Medicare Other | Admitting: *Deleted

## 2013-06-25 DIAGNOSIS — Z7901 Long term (current) use of anticoagulants: Secondary | ICD-10-CM

## 2013-06-25 DIAGNOSIS — I5031 Acute diastolic (congestive) heart failure: Secondary | ICD-10-CM | POA: Diagnosis not present

## 2013-06-25 DIAGNOSIS — I4892 Unspecified atrial flutter: Secondary | ICD-10-CM | POA: Diagnosis not present

## 2013-06-25 DIAGNOSIS — I482 Chronic atrial fibrillation, unspecified: Secondary | ICD-10-CM

## 2013-06-25 DIAGNOSIS — I509 Heart failure, unspecified: Secondary | ICD-10-CM

## 2013-06-25 DIAGNOSIS — I4891 Unspecified atrial fibrillation: Secondary | ICD-10-CM | POA: Diagnosis not present

## 2013-06-25 DIAGNOSIS — Z5181 Encounter for therapeutic drug level monitoring: Secondary | ICD-10-CM | POA: Diagnosis not present

## 2013-06-25 DIAGNOSIS — I10 Essential (primary) hypertension: Secondary | ICD-10-CM

## 2013-06-25 DIAGNOSIS — I5032 Chronic diastolic (congestive) heart failure: Secondary | ICD-10-CM | POA: Diagnosis not present

## 2013-06-25 LAB — BASIC METABOLIC PANEL
BUN: 24 mg/dL — AB (ref 6–23)
CALCIUM: 9.2 mg/dL (ref 8.4–10.5)
CO2: 26 mEq/L (ref 19–32)
Chloride: 102 mEq/L (ref 96–112)
Creatinine, Ser: 1.8 mg/dL — ABNORMAL HIGH (ref 0.4–1.2)
GFR: 28.88 mL/min — AB (ref >60.00)
Glucose, Bld: 92 mg/dL (ref 70–99)
Potassium: 4.4 mEq/L (ref 3.5–5.1)
Sodium: 136 mEq/L (ref 135–145)

## 2013-06-25 LAB — POCT INR: INR: 2.1

## 2013-06-26 NOTE — Telephone Encounter (Signed)
Pt states he weight is up 2 pounds and she is more SOB today. She took extra 20mg  lasix 06/23/13, she took lasix 20mg  Tuesday and today. She had BMET yesterday. Pt is asking if she should make any adjustments in her medications.

## 2013-06-26 NOTE — Telephone Encounter (Signed)
Reviewed with Collier Salina lasix to 20mg  daily, continue KCL daily with lasix, repeat BMET on Monday 06/30/13. I discussed with patient. I have also left a messge for [place], [facility] and Arnoldo Hooker, her daughter to review instructions with them.

## 2013-06-26 NOTE — Telephone Encounter (Signed)
New message     Patient calling gain 2 lbs since last night. Discharge instruction advise her to call the office. Patient stating she having sob now .

## 2013-06-26 NOTE — Telephone Encounter (Signed)
Spoke with Nira Conn, [facility] - verbal orders given for lasix, continue KCL daily and repeat lab Monday 06/30/13

## 2013-06-26 NOTE — Telephone Encounter (Signed)
Discussed with patient's daughter, Curt Bears. She verbalized understanding.

## 2013-06-27 DIAGNOSIS — H3530 Unspecified macular degeneration: Secondary | ICD-10-CM | POA: Diagnosis not present

## 2013-06-27 DIAGNOSIS — I5031 Acute diastolic (congestive) heart failure: Secondary | ICD-10-CM | POA: Diagnosis not present

## 2013-06-27 DIAGNOSIS — I4891 Unspecified atrial fibrillation: Secondary | ICD-10-CM | POA: Diagnosis not present

## 2013-06-27 DIAGNOSIS — N183 Chronic kidney disease, stage 3 unspecified: Secondary | ICD-10-CM | POA: Diagnosis not present

## 2013-06-27 DIAGNOSIS — I509 Heart failure, unspecified: Secondary | ICD-10-CM | POA: Diagnosis not present

## 2013-06-27 DIAGNOSIS — I110 Hypertensive heart disease with heart failure: Secondary | ICD-10-CM | POA: Diagnosis not present

## 2013-06-30 ENCOUNTER — Encounter: Payer: Self-pay | Admitting: Cardiology

## 2013-06-30 ENCOUNTER — Telehealth: Payer: Self-pay | Admitting: Cardiology

## 2013-06-30 ENCOUNTER — Other Ambulatory Visit: Payer: Medicare Other

## 2013-06-30 DIAGNOSIS — I4891 Unspecified atrial fibrillation: Secondary | ICD-10-CM | POA: Diagnosis not present

## 2013-06-30 DIAGNOSIS — I110 Hypertensive heart disease with heart failure: Secondary | ICD-10-CM | POA: Diagnosis not present

## 2013-06-30 DIAGNOSIS — I5031 Acute diastolic (congestive) heart failure: Secondary | ICD-10-CM | POA: Diagnosis not present

## 2013-06-30 DIAGNOSIS — I509 Heart failure, unspecified: Secondary | ICD-10-CM | POA: Diagnosis not present

## 2013-06-30 DIAGNOSIS — H3530 Unspecified macular degeneration: Secondary | ICD-10-CM | POA: Diagnosis not present

## 2013-06-30 DIAGNOSIS — N183 Chronic kidney disease, stage 3 unspecified: Secondary | ICD-10-CM | POA: Diagnosis not present

## 2013-06-30 NOTE — Telephone Encounter (Signed)
Called gave verbal order to check INR on Monday 07/07/13. Results to be called to [facility].

## 2013-06-30 NOTE — Telephone Encounter (Signed)
New message     [facility] .     Next visit in the home is next week . Will need an order to draw PT/INR.

## 2013-07-03 DIAGNOSIS — I110 Hypertensive heart disease with heart failure: Secondary | ICD-10-CM | POA: Diagnosis not present

## 2013-07-03 DIAGNOSIS — I509 Heart failure, unspecified: Secondary | ICD-10-CM | POA: Diagnosis not present

## 2013-07-03 DIAGNOSIS — N183 Chronic kidney disease, stage 3 unspecified: Secondary | ICD-10-CM | POA: Diagnosis not present

## 2013-07-03 DIAGNOSIS — I5031 Acute diastolic (congestive) heart failure: Secondary | ICD-10-CM | POA: Diagnosis not present

## 2013-07-03 DIAGNOSIS — H3530 Unspecified macular degeneration: Secondary | ICD-10-CM | POA: Diagnosis not present

## 2013-07-03 DIAGNOSIS — I4891 Unspecified atrial fibrillation: Secondary | ICD-10-CM | POA: Diagnosis not present

## 2013-07-07 ENCOUNTER — Other Ambulatory Visit: Payer: Self-pay | Admitting: *Deleted

## 2013-07-07 ENCOUNTER — Ambulatory Visit (INDEPENDENT_AMBULATORY_CARE_PROVIDER_SITE_OTHER): Payer: Medicare Other | Admitting: Internal Medicine

## 2013-07-07 ENCOUNTER — Telehealth: Payer: Self-pay | Admitting: *Deleted

## 2013-07-07 DIAGNOSIS — I110 Hypertensive heart disease with heart failure: Secondary | ICD-10-CM | POA: Diagnosis not present

## 2013-07-07 DIAGNOSIS — N183 Chronic kidney disease, stage 3 unspecified: Secondary | ICD-10-CM | POA: Diagnosis not present

## 2013-07-07 DIAGNOSIS — I5031 Acute diastolic (congestive) heart failure: Secondary | ICD-10-CM

## 2013-07-07 DIAGNOSIS — I482 Chronic atrial fibrillation, unspecified: Secondary | ICD-10-CM

## 2013-07-07 DIAGNOSIS — I4892 Unspecified atrial flutter: Secondary | ICD-10-CM | POA: Diagnosis not present

## 2013-07-07 DIAGNOSIS — H3530 Unspecified macular degeneration: Secondary | ICD-10-CM | POA: Diagnosis not present

## 2013-07-07 DIAGNOSIS — I509 Heart failure, unspecified: Secondary | ICD-10-CM | POA: Diagnosis not present

## 2013-07-07 DIAGNOSIS — I4891 Unspecified atrial fibrillation: Secondary | ICD-10-CM

## 2013-07-07 DIAGNOSIS — Z5181 Encounter for therapeutic drug level monitoring: Secondary | ICD-10-CM

## 2013-07-07 DIAGNOSIS — I10 Essential (primary) hypertension: Secondary | ICD-10-CM

## 2013-07-07 DIAGNOSIS — I5032 Chronic diastolic (congestive) heart failure: Secondary | ICD-10-CM

## 2013-07-07 LAB — POCT INR: INR: 2

## 2013-07-07 MED ORDER — POTASSIUM CHLORIDE ER 20 MEQ PO TBCR: 20.0000 meq | EXTENDED_RELEASE_TABLET | Freq: Every day | ORAL | Status: DC

## 2013-07-07 MED ORDER — FUROSEMIDE 20 MG PO TABS: ORAL_TABLET | ORAL | Status: DC

## 2013-07-07 NOTE — Telephone Encounter (Signed)
Patient calling stating her meds have recently changed and not sure she has enough to last until next week. She was told by the pharmacy she can not have refills until April 19 and call the Dr's office about new rx. I called the pharmacy and the associate stated she had her Metoprolol and Diltiazem refilled March 16 and she should be able to receive them this weekend. Her Furosemide and Potassium has recently changed and the former refill will not cover her the entire month. I will resend in these rx's but patient was asked by the pharmacy if there is an discrepancy in the count of her medications to please come to the pharmacy for a recount and further explanation on how her medication is suppose to be be taken. She also had a question on Maxzide but will call Dr Renold Genta since it was prescribed through her office.    She wanted me to let her nurses know. I will forward to [facility] and Claiborne Billings for Dr Percival Spanish and Dr Lovena Le.

## 2013-07-08 ENCOUNTER — Other Ambulatory Visit: Payer: Self-pay

## 2013-07-08 NOTE — Telephone Encounter (Signed)
Patient's daughter picked up a Rx for Triamterene37.5mg . It is unclear who authorized this because she is taking Lasix 20 mg daily. Advised patient not to take the Triamterene and pharmacy also advised to discontinue this,

## 2013-07-09 DIAGNOSIS — I509 Heart failure, unspecified: Secondary | ICD-10-CM | POA: Diagnosis not present

## 2013-07-09 DIAGNOSIS — H3530 Unspecified macular degeneration: Secondary | ICD-10-CM | POA: Diagnosis not present

## 2013-07-09 DIAGNOSIS — I110 Hypertensive heart disease with heart failure: Secondary | ICD-10-CM | POA: Diagnosis not present

## 2013-07-09 DIAGNOSIS — I4891 Unspecified atrial fibrillation: Secondary | ICD-10-CM | POA: Diagnosis not present

## 2013-07-09 DIAGNOSIS — I5031 Acute diastolic (congestive) heart failure: Secondary | ICD-10-CM | POA: Diagnosis not present

## 2013-07-09 DIAGNOSIS — N183 Chronic kidney disease, stage 3 unspecified: Secondary | ICD-10-CM | POA: Diagnosis not present

## 2013-07-14 ENCOUNTER — Ambulatory Visit (INDEPENDENT_AMBULATORY_CARE_PROVIDER_SITE_OTHER): Payer: Medicare Other | Admitting: Physician Assistant

## 2013-07-14 ENCOUNTER — Encounter: Payer: Self-pay | Admitting: Physician Assistant

## 2013-07-14 VITALS — BP 120/70 | HR 84 | Ht 65.0 in | Wt 141.0 lb

## 2013-07-14 DIAGNOSIS — I5031 Acute diastolic (congestive) heart failure: Secondary | ICD-10-CM | POA: Diagnosis not present

## 2013-07-14 DIAGNOSIS — I10 Essential (primary) hypertension: Secondary | ICD-10-CM

## 2013-07-14 DIAGNOSIS — I4892 Unspecified atrial flutter: Secondary | ICD-10-CM | POA: Diagnosis not present

## 2013-07-14 DIAGNOSIS — I4891 Unspecified atrial fibrillation: Secondary | ICD-10-CM | POA: Diagnosis not present

## 2013-07-14 DIAGNOSIS — N189 Chronic kidney disease, unspecified: Secondary | ICD-10-CM | POA: Diagnosis not present

## 2013-07-14 DIAGNOSIS — I5032 Chronic diastolic (congestive) heart failure: Secondary | ICD-10-CM

## 2013-07-14 NOTE — Patient Instructions (Signed)
Your physician recommends that you schedule a follow-up appointment in: 3 months with Dr Percival Spanish. Call end of [place] to set app for mid July  Your physician recommends that you continue on your current medications as directed. Please refer to the Current Medication list given to you today.

## 2013-07-14 NOTE — Progress Notes (Signed)
[place], [place], [place]  [address] Phone: [phone] Fax:  [phone]  Date:  07/14/2013   ID:  [name], DOB 03/04/1922, MRN [id number]  PCP:  Elby Showers, MD  Cardiologist:  Dr. Minus Breeding   Electrophysiologist:  Dr. Cristopher Peru    History of Present Illness: [name] is a 80 y.o. female with a hx of permanent atrial fibrillation (previously on amiodarone-stopped due to macular degeneration), symptomatic bradycardia status post pacemaker, atrial flutter status post ablation, HTN, CKD.   Admitted 11/8414 with a/c diastolic CHF in the setting of atrial fibrillation with RVR and possible community-acquired pneumonia.   I saw her in f/u 06/19/13.  She was doing well at that time. She returns for close follow up.  She is doing well. She has chronic dyspnea with exertion. She is probably NYHA class IIb. She denies orthopnea, PND or edema. She denies syncope or near syncope.    Recent Labs: 08/30/2012: HDL Cholesterol 58; LDL (calc) 88  06/09/2013: ALT 28  06/10/2013: Hemoglobin 10.0*; TSH 0.542  06/15/2013: Pro B Natriuretic peptide (BNP) 4062.0*  06/25/2013: Creatinine 1.8*; Potassium 4.4   Wt Readings from Last 3 Encounters:  07/14/13 141 lb (63.957 kg)  06/19/13 141 lb (63.957 kg)  06/16/13 135 lb 11.2 oz (61.553 kg)     Past Medical History  Diagnosis Date  . Symptomatic sinus bradycardia     s/p pacemaker  . Atrial flutter     s/p RFCA  . Atrial fibrillation     On warfarin  . Hypothyroidism   . GERD (gastroesophageal reflux disease)   . Esophageal stricture   . Anemia   . Asthma   . Tremor, essential   . Diverticulosis   . Vertigo   . Urinary, incontinence, stress female   . DJD (degenerative joint disease)     knees  . Renal insufficiency   . Hearing loss   . Macular degeneration   . HTN (hypertension)     a.  echo 3/06: EF 55-65%, mild LVH, mild RVH;   b.  Echo (06/10/13): EF 50-55%, normal wall motion, MAC, mild MR, mild  RPE, PASP 40.    Current Outpatient Prescriptions  Medication Sig Dispense Refill  . ALPRAZolam (XANAX) 0.5 MG tablet Take 0.5 mg by mouth at bedtime.      . Cholecalciferol (VITAMIN D3) 1000 UNITS CAPS Take 1 capsule by mouth daily.       . cycloSPORINE (RESTASIS) 0.05 % ophthalmic emulsion Place 1 drop into both eyes 2 (two) times daily.       Marland Kitchen diltiazem (CARDIZEM CD) 120 MG 24 hr capsule Take 1 capsule (120 mg total) by mouth daily.  30 capsule  5  . esomeprazole (NEXIUM) 40 MG capsule Take 1 capsule (40 mg total) by mouth daily before breakfast.  90 capsule  3  . ferrous sulfate 325 (65 FE) MG tablet Take 325 mg by mouth daily with breakfast.        . Fluticasone-Salmeterol (ADVAIR DISKUS) 250-50 MCG/DOSE AEPB Inhale 1 puff into the lungs every 12 (twelve) hours.  60 each  5  . furosemide (LASIX) 20 MG tablet 1 tablet daily  30 tablet  3  . levothyroxine (SYNTHROID, LEVOTHROID) 88 MCG tablet Take 88 mcg by mouth every evening.      . metoprolol (LOPRESSOR) 100 MG tablet Take 1 tablet (100 mg total) by mouth 2 (two) times daily.  60 tablet  5  . Multiple  Vitamins-Minerals (ICAPS) CAPS Take 1 capsule by mouth 2 (two) times daily.       . Potassium Chloride ER 20 MEQ TBCR Take 20 mEq by mouth daily.  30 tablet  3  . psyllium (METAMUCIL) 58.6 % powder Take 1 packet by mouth every evening.       . warfarin (COUMADIN) 4 MG tablet Take 4-6 mg by mouth daily. Take 4mg  daily except 6mg  on Tuesdays       Current Facility-Administered Medications  Medication Dose Route Frequency Provider Last Rate Last Dose  . TDaP (BOOSTRIX) injection 0.5 mL  0.5 mL Intramuscular Once Elby Showers, MD        Allergies:   Review of patient's allergies indicates no known allergies.   Social History:  The patient  reports that she quit smoking about 51 years ago. Her smoking use included Cigarettes. She smoked 0.00 packs per day. She has never used smokeless tobacco. She reports that she drinks alcohol. She  reports that she does not use illicit drugs.   Family History:  The patient's family history includes Cancer in her father and mother; Cirrhosis in her brother.   ROS:  Please see the history of present illness.     All other systems reviewed and negative.   PHYSICAL EXAM: VS:  BP 120/70  Pulse 84  Ht 5\' 5"  (1.651 m)  Wt 141 lb (63.957 kg)  BMI 23.46 kg/m2 Well nourished, well developed, in no acute distress HEENT: normal Neck: no JVD Cardiac:  normal S1, S2; irregularly irregular rhythm; no murmur Lungs:  clear to auscultation bilaterally, no wheezing, rhonchi or rales Abd: soft, nontender, no hepatomegaly Ext: no edema Skin: warm and dry Neuro:  CNs 2-12 intact, no focal abnormalities noted  EKG:   Atrial fibrillation, HR 84  ASSESSMENT AND PLAN:  1. Chronic Diastolic CHF:  Volume appears stable. Continue current dose of Lasix. 2. Atrial Fibrillation:  Rate is controlled. She continues to tolerate Coumadin. Continue current therapy. 3. Chronic Kidney Disease:  Creatinine stable by recent measurements. 4. Hypertension:  Controlled. 5. Status Post Pacemaker:   Follow up with EP planned. 6. Disposition:  Follow up with Dr. Percival Spanish in 3 months.   Signed, Richardson Dopp, PA-C  07/14/2013 12:28 PM

## 2013-07-15 DIAGNOSIS — N189 Chronic kidney disease, unspecified: Secondary | ICD-10-CM | POA: Diagnosis not present

## 2013-07-15 DIAGNOSIS — I10 Essential (primary) hypertension: Secondary | ICD-10-CM

## 2013-07-15 DIAGNOSIS — I5032 Chronic diastolic (congestive) heart failure: Secondary | ICD-10-CM | POA: Diagnosis not present

## 2013-07-15 DIAGNOSIS — I4891 Unspecified atrial fibrillation: Secondary | ICD-10-CM | POA: Diagnosis not present

## 2013-07-17 DIAGNOSIS — I110 Hypertensive heart disease with heart failure: Secondary | ICD-10-CM | POA: Diagnosis not present

## 2013-07-17 DIAGNOSIS — I4891 Unspecified atrial fibrillation: Secondary | ICD-10-CM | POA: Diagnosis not present

## 2013-07-17 DIAGNOSIS — N183 Chronic kidney disease, stage 3 unspecified: Secondary | ICD-10-CM | POA: Diagnosis not present

## 2013-07-17 DIAGNOSIS — I5031 Acute diastolic (congestive) heart failure: Secondary | ICD-10-CM | POA: Diagnosis not present

## 2013-07-17 DIAGNOSIS — H3530 Unspecified macular degeneration: Secondary | ICD-10-CM | POA: Diagnosis not present

## 2013-07-17 DIAGNOSIS — I509 Heart failure, unspecified: Secondary | ICD-10-CM | POA: Diagnosis not present

## 2013-07-21 ENCOUNTER — Ambulatory Visit (INDEPENDENT_AMBULATORY_CARE_PROVIDER_SITE_OTHER): Payer: Medicare Other | Admitting: Pharmacist

## 2013-07-21 DIAGNOSIS — H3530 Unspecified macular degeneration: Secondary | ICD-10-CM | POA: Diagnosis not present

## 2013-07-21 DIAGNOSIS — I5031 Acute diastolic (congestive) heart failure: Secondary | ICD-10-CM | POA: Diagnosis not present

## 2013-07-21 DIAGNOSIS — I4892 Unspecified atrial flutter: Secondary | ICD-10-CM

## 2013-07-21 DIAGNOSIS — I4891 Unspecified atrial fibrillation: Secondary | ICD-10-CM | POA: Diagnosis not present

## 2013-07-21 DIAGNOSIS — I509 Heart failure, unspecified: Secondary | ICD-10-CM | POA: Diagnosis not present

## 2013-07-21 DIAGNOSIS — Z5181 Encounter for therapeutic drug level monitoring: Secondary | ICD-10-CM

## 2013-07-21 DIAGNOSIS — I482 Chronic atrial fibrillation, unspecified: Secondary | ICD-10-CM

## 2013-07-21 DIAGNOSIS — N183 Chronic kidney disease, stage 3 unspecified: Secondary | ICD-10-CM | POA: Diagnosis not present

## 2013-07-21 DIAGNOSIS — I110 Hypertensive heart disease with heart failure: Secondary | ICD-10-CM | POA: Diagnosis not present

## 2013-07-21 LAB — POCT INR: INR: 1.7

## 2013-07-28 ENCOUNTER — Telehealth: Payer: Self-pay | Admitting: Cardiology

## 2013-07-28 NOTE — Telephone Encounter (Signed)
Heather with [facility] needs orders for PT & INR? Please call back and advise.

## 2013-07-29 NOTE — Telephone Encounter (Signed)
Called spoke with [facility] RN gave verbal order to check PT/INR on 07/31/13 and call results to [facility].  Will await results.

## 2013-07-31 ENCOUNTER — Ambulatory Visit (INDEPENDENT_AMBULATORY_CARE_PROVIDER_SITE_OTHER): Payer: Medicare Other | Admitting: Internal Medicine

## 2013-07-31 DIAGNOSIS — I110 Hypertensive heart disease with heart failure: Secondary | ICD-10-CM | POA: Diagnosis not present

## 2013-07-31 DIAGNOSIS — I4892 Unspecified atrial flutter: Secondary | ICD-10-CM

## 2013-07-31 DIAGNOSIS — I4891 Unspecified atrial fibrillation: Secondary | ICD-10-CM

## 2013-07-31 DIAGNOSIS — I509 Heart failure, unspecified: Secondary | ICD-10-CM | POA: Diagnosis not present

## 2013-07-31 DIAGNOSIS — I482 Chronic atrial fibrillation, unspecified: Secondary | ICD-10-CM

## 2013-07-31 DIAGNOSIS — Z5181 Encounter for therapeutic drug level monitoring: Secondary | ICD-10-CM

## 2013-07-31 DIAGNOSIS — N183 Chronic kidney disease, stage 3 unspecified: Secondary | ICD-10-CM | POA: Diagnosis not present

## 2013-07-31 DIAGNOSIS — H3530 Unspecified macular degeneration: Secondary | ICD-10-CM | POA: Diagnosis not present

## 2013-07-31 DIAGNOSIS — I5031 Acute diastolic (congestive) heart failure: Secondary | ICD-10-CM | POA: Diagnosis not present

## 2013-07-31 LAB — POCT INR: INR: 2.3

## 2013-08-11 DIAGNOSIS — H43819 Vitreous degeneration, unspecified eye: Secondary | ICD-10-CM | POA: Diagnosis not present

## 2013-08-12 ENCOUNTER — Ambulatory Visit (INDEPENDENT_AMBULATORY_CARE_PROVIDER_SITE_OTHER): Payer: Medicare Other | Admitting: Pharmacist Clinician (PhC)/ Clinical Pharmacy Specialist

## 2013-08-12 DIAGNOSIS — I5031 Acute diastolic (congestive) heart failure: Secondary | ICD-10-CM | POA: Diagnosis not present

## 2013-08-12 DIAGNOSIS — H3530 Unspecified macular degeneration: Secondary | ICD-10-CM | POA: Diagnosis not present

## 2013-08-12 DIAGNOSIS — N183 Chronic kidney disease, stage 3 unspecified: Secondary | ICD-10-CM | POA: Diagnosis not present

## 2013-08-12 DIAGNOSIS — I509 Heart failure, unspecified: Secondary | ICD-10-CM | POA: Diagnosis not present

## 2013-08-12 DIAGNOSIS — I4891 Unspecified atrial fibrillation: Secondary | ICD-10-CM

## 2013-08-12 DIAGNOSIS — Z5181 Encounter for therapeutic drug level monitoring: Secondary | ICD-10-CM

## 2013-08-12 DIAGNOSIS — I482 Chronic atrial fibrillation, unspecified: Secondary | ICD-10-CM

## 2013-08-12 DIAGNOSIS — I4892 Unspecified atrial flutter: Secondary | ICD-10-CM

## 2013-08-12 DIAGNOSIS — I110 Hypertensive heart disease with heart failure: Secondary | ICD-10-CM | POA: Diagnosis not present

## 2013-08-12 LAB — POCT INR: INR: 3.8

## 2013-08-14 ENCOUNTER — Ambulatory Visit (INDEPENDENT_AMBULATORY_CARE_PROVIDER_SITE_OTHER): Payer: Medicare Other | Admitting: *Deleted

## 2013-08-14 DIAGNOSIS — I495 Sick sinus syndrome: Secondary | ICD-10-CM | POA: Diagnosis not present

## 2013-08-14 NOTE — Progress Notes (Signed)
Remote pacemaker transmission.   

## 2013-08-17 NOTE — Patient Instructions (Signed)
Continue same medications and return in 6 months 

## 2013-08-17 NOTE — Progress Notes (Signed)
   Subjective:    Patient ID: [name], female    DOB: [birth year], 80 y.o.   MRN: [id number]  HPI 80 year old white female with multiple medical issues in today for six-month recheck. She is on thyroid replacement therapy and here for TSH. History of vertigo, atrial fibrillation, chronic kidney disease, benign essential tremor, hypertension, GE reflux, chronic anticoagulation therapy. She's under close observation by cardiologist. History of anxiety. Takes Nexium for GE reflux. Coumadin for chronic anticoagulation. She has a pacemaker. This was due to sick sinus syndrome in 2005. Had esophageal stricture dilated in 1996. Has osteoarthritis of the knees. Had Schatzki's ring 1992. Negative carotid Dopplers in 2003. Colonoscopy done 2001 and will not be repeated due to her age. Cataract extraction right eye 1992. Left eye 1993. Right middle lobe pneumonia in 1996.  She is a widow and formerly smoked a pack of cigarettes daily for 5 years but quit in the 1960s. Has a daughter who resides in [place]. She continues to reside alone. Son resides in [place]. She was widowed in the early 1990s and is a retired Chief Technology Officer. Essential tremor started in the early 1990s and was present when I started seeing her in 1993. She has bilateral hearing aids. She walks with a cane. History of environmental allergies.    Review of Systems     Objective:   Physical Exam Skin warm and dry. Nodes none. TMs and pharynx are clear. Chest bilateral hearing aids. Neck supple. No carotid bruits. Chest clear. Cardiac exam irregular irregular rhythm. Extremities without edema.       Assessment & Plan:  History of pacemaker for sick sinus syndrome  History of atrial fibrillation on chronic anticoagulation therapy  History of GE reflux  History of benign positional vertigo  Hypothyroidism-continue same dose of thyroid replacement. TSH is stable.  History of chronic kidney disease  Benign  essential tremor  Hypertension-stable  History of allergic rhinitis  History of anxiety  Plan: Return in 6 months for physical examination

## 2013-08-20 ENCOUNTER — Ambulatory Visit (INDEPENDENT_AMBULATORY_CARE_PROVIDER_SITE_OTHER): Payer: Medicare Other | Admitting: *Deleted

## 2013-08-20 DIAGNOSIS — I4891 Unspecified atrial fibrillation: Secondary | ICD-10-CM | POA: Diagnosis not present

## 2013-08-20 DIAGNOSIS — I4892 Unspecified atrial flutter: Secondary | ICD-10-CM

## 2013-08-20 DIAGNOSIS — Z5181 Encounter for therapeutic drug level monitoring: Secondary | ICD-10-CM | POA: Diagnosis not present

## 2013-08-20 DIAGNOSIS — I482 Chronic atrial fibrillation, unspecified: Secondary | ICD-10-CM

## 2013-08-20 DIAGNOSIS — Z7901 Long term (current) use of anticoagulants: Secondary | ICD-10-CM | POA: Diagnosis not present

## 2013-08-20 LAB — POCT INR: INR: 2.4

## 2013-08-29 ENCOUNTER — Other Ambulatory Visit: Payer: Self-pay | Admitting: Internal Medicine

## 2013-08-29 NOTE — Telephone Encounter (Signed)
Please make sure this is refilled x 6 months

## 2013-09-03 ENCOUNTER — Ambulatory Visit (INDEPENDENT_AMBULATORY_CARE_PROVIDER_SITE_OTHER): Payer: Medicare Other | Admitting: *Deleted

## 2013-09-03 DIAGNOSIS — I482 Chronic atrial fibrillation, unspecified: Secondary | ICD-10-CM

## 2013-09-03 DIAGNOSIS — I4891 Unspecified atrial fibrillation: Secondary | ICD-10-CM

## 2013-09-03 DIAGNOSIS — Z5181 Encounter for therapeutic drug level monitoring: Secondary | ICD-10-CM | POA: Diagnosis not present

## 2013-09-03 DIAGNOSIS — I4892 Unspecified atrial flutter: Secondary | ICD-10-CM

## 2013-09-03 DIAGNOSIS — Z7901 Long term (current) use of anticoagulants: Secondary | ICD-10-CM

## 2013-09-03 LAB — POCT INR: INR: 3

## 2013-09-05 ENCOUNTER — Encounter: Payer: Self-pay | Admitting: Internal Medicine

## 2013-09-05 ENCOUNTER — Ambulatory Visit (INDEPENDENT_AMBULATORY_CARE_PROVIDER_SITE_OTHER): Payer: Medicare Other | Admitting: Internal Medicine

## 2013-09-05 VITALS — BP 130/84 | HR 76 | Temp 97.0°F | Ht 64.5 in | Wt 135.5 lb

## 2013-09-05 DIAGNOSIS — E039 Hypothyroidism, unspecified: Secondary | ICD-10-CM | POA: Diagnosis not present

## 2013-09-05 DIAGNOSIS — H8110 Benign paroxysmal vertigo, unspecified ear: Secondary | ICD-10-CM

## 2013-09-05 DIAGNOSIS — F411 Generalized anxiety disorder: Secondary | ICD-10-CM

## 2013-09-05 DIAGNOSIS — H9193 Unspecified hearing loss, bilateral: Secondary | ICD-10-CM

## 2013-09-05 DIAGNOSIS — Z0000 Encounter for general adult medical examination without abnormal findings: Secondary | ICD-10-CM

## 2013-09-05 DIAGNOSIS — I4891 Unspecified atrial fibrillation: Secondary | ICD-10-CM

## 2013-09-05 DIAGNOSIS — Z950 Presence of cardiac pacemaker: Secondary | ICD-10-CM

## 2013-09-05 DIAGNOSIS — Z13220 Encounter for screening for lipoid disorders: Secondary | ICD-10-CM

## 2013-09-05 DIAGNOSIS — D649 Anemia, unspecified: Secondary | ICD-10-CM

## 2013-09-05 DIAGNOSIS — Z862 Personal history of diseases of the blood and blood-forming organs and certain disorders involving the immune mechanism: Secondary | ICD-10-CM | POA: Diagnosis not present

## 2013-09-05 DIAGNOSIS — I10 Essential (primary) hypertension: Secondary | ICD-10-CM | POA: Diagnosis not present

## 2013-09-05 DIAGNOSIS — Z7901 Long term (current) use of anticoagulants: Secondary | ICD-10-CM

## 2013-09-05 DIAGNOSIS — H3530 Unspecified macular degeneration: Secondary | ICD-10-CM

## 2013-09-05 DIAGNOSIS — I482 Chronic atrial fibrillation, unspecified: Secondary | ICD-10-CM

## 2013-09-05 DIAGNOSIS — K219 Gastro-esophageal reflux disease without esophagitis: Secondary | ICD-10-CM

## 2013-09-05 DIAGNOSIS — H9190 Unspecified hearing loss, unspecified ear: Secondary | ICD-10-CM

## 2013-09-05 LAB — CBC WITH DIFFERENTIAL/PLATELET
BASOS ABS: 0.1 10*3/uL (ref 0.0–0.1)
Basophils Relative: 1 % (ref 0–1)
Eosinophils Absolute: 0.5 10*3/uL (ref 0.0–0.7)
Eosinophils Relative: 7 % — ABNORMAL HIGH (ref 0–5)
HEMATOCRIT: 36.5 % (ref 36.0–46.0)
Hemoglobin: 12.5 g/dL (ref 12.0–15.0)
LYMPHS PCT: 27 % (ref 12–46)
Lymphs Abs: 1.8 10*3/uL (ref 0.7–4.0)
MCH: 31.8 pg (ref 26.0–34.0)
MCHC: 34.2 g/dL (ref 30.0–36.0)
MCV: 92.9 fL (ref 78.0–100.0)
MONO ABS: 0.8 10*3/uL (ref 0.1–1.0)
Monocytes Relative: 12 % (ref 3–12)
NEUTROS ABS: 3.5 10*3/uL (ref 1.7–7.7)
Neutrophils Relative %: 53 % (ref 43–77)
Platelets: 237 10*3/uL (ref 150–400)
RBC: 3.93 MIL/uL (ref 3.87–5.11)
RDW: 14.7 % (ref 11.5–15.5)
WBC: 6.6 10*3/uL (ref 4.0–10.5)

## 2013-09-05 LAB — LIPID PANEL
Cholesterol: 162 mg/dL (ref 0–200)
HDL: 51 mg/dL (ref >39)
LDL Cholesterol: 88 mg/dL (ref 0–99)
Total CHOL/HDL Ratio: 3.2 Ratio
Triglycerides: 114 mg/dL (ref <150)
VLDL: 23 mg/dL (ref 0–40)

## 2013-09-05 LAB — COMPREHENSIVE METABOLIC PANEL
ALT: 10 U/L (ref 0–35)
AST: 13 U/L (ref 0–37)
Albumin: 3.8 g/dL (ref 3.5–5.2)
Alkaline Phosphatase: 73 U/L (ref 39–117)
BUN: 21 mg/dL (ref 6–23)
CALCIUM: 9.3 mg/dL (ref 8.4–10.5)
CHLORIDE: 102 meq/L (ref 96–112)
CO2: 29 mEq/L (ref 19–32)
Creat: 1.64 mg/dL — ABNORMAL HIGH (ref 0.50–1.10)
Glucose, Bld: 88 mg/dL (ref 70–99)
Potassium: 4.3 mEq/L (ref 3.5–5.3)
Sodium: 138 mEq/L (ref 135–145)
Total Bilirubin: 0.6 mg/dL (ref 0.2–1.2)
Total Protein: 5.9 g/dL — ABNORMAL LOW (ref 6.0–8.3)

## 2013-09-05 MED ORDER — ESOMEPRAZOLE MAGNESIUM 40 MG PO CPDR: 40.0000 mg | DELAYED_RELEASE_CAPSULE | Freq: Every day | ORAL | Status: DC

## 2013-09-05 NOTE — Patient Instructions (Addendum)
Return in 6 months. Continue same meds.

## 2013-09-06 LAB — TSH: TSH: 2.648 u[IU]/mL (ref 0.350–4.500)

## 2013-09-08 LAB — POCT URINALYSIS DIPSTICK
BILIRUBIN UA: NEGATIVE
Blood, UA: NEGATIVE
Glucose, UA: NEGATIVE
KETONES UA: NEGATIVE
LEUKOCYTES UA: NEGATIVE
Nitrite, UA: NEGATIVE
PROTEIN UA: NEGATIVE
Spec Grav, UA: 1.01
Urobilinogen, UA: NEGATIVE
pH, UA: 6

## 2013-09-08 LAB — MDC_IDC_ENUM_SESS_TYPE_REMOTE
Brady Statistic AP VS Percent: 2.46 %
Brady Statistic AS VS Percent: 81.24 %
Brady Statistic RA Percent Paced: 2.72 %
Brady Statistic RV Percent Paced: 16.3 %
Date Time Interrogation Session: 20150514142620
Lead Channel Impedance Value: 376 Ohm
Lead Channel Impedance Value: 504 Ohm
Lead Channel Sensing Intrinsic Amplitude: 7.2038
Lead Channel Setting Pacing Amplitude: 2 V
Lead Channel Setting Pacing Amplitude: 3 V
Lead Channel Setting Pacing Pulse Width: 0.4 ms
Lead Channel Setting Sensing Sensitivity: 0.9 mV
MDC IDC MSMT BATTERY VOLTAGE: 2.83 V
MDC IDC SET ZONE DETECTION INTERVAL: 400 ms
MDC IDC STAT BRADY AP VP PERCENT: 0.25 %
MDC IDC STAT BRADY AS VP PERCENT: 16.05 %
Zone Setting Detection Interval: 350 ms

## 2013-09-12 ENCOUNTER — Encounter: Payer: Self-pay | Admitting: Cardiology

## 2013-09-17 ENCOUNTER — Ambulatory Visit (INDEPENDENT_AMBULATORY_CARE_PROVIDER_SITE_OTHER): Payer: Medicare Other | Admitting: *Deleted

## 2013-09-17 ENCOUNTER — Telehealth: Payer: Self-pay | Admitting: Cardiology

## 2013-09-17 ENCOUNTER — Encounter: Payer: Self-pay | Admitting: Internal Medicine

## 2013-09-17 DIAGNOSIS — Z950 Presence of cardiac pacemaker: Secondary | ICD-10-CM

## 2013-09-17 NOTE — Telephone Encounter (Signed)
Spoke with pt and reminded pt of remote transmission that is due today. Pt verbalized understanding.   

## 2013-09-17 NOTE — Progress Notes (Signed)
Remote pacemaker transmission.   

## 2013-09-22 LAB — MDC_IDC_ENUM_SESS_TYPE_REMOTE
Battery Voltage: 2.83 V
Lead Channel Impedance Value: 384 Ohm
Lead Channel Impedance Value: 544 Ohm
Lead Channel Setting Pacing Amplitude: 2 V
Lead Channel Setting Pacing Pulse Width: 0.4 ms
MDC IDC SET LEADCHNL RV PACING AMPLITUDE: 3 V
MDC IDC SET LEADCHNL RV SENSING SENSITIVITY: 0.9 mV
MDC IDC SET ZONE DETECTION INTERVAL: 350 ms
Zone Setting Detection Interval: 400 ms

## 2013-09-23 ENCOUNTER — Other Ambulatory Visit: Payer: Self-pay | Admitting: Internal Medicine

## 2013-09-23 ENCOUNTER — Encounter: Payer: Self-pay | Admitting: Cardiology

## 2013-09-24 ENCOUNTER — Ambulatory Visit (INDEPENDENT_AMBULATORY_CARE_PROVIDER_SITE_OTHER): Payer: Medicare Other | Admitting: *Deleted

## 2013-09-24 DIAGNOSIS — Z5181 Encounter for therapeutic drug level monitoring: Secondary | ICD-10-CM | POA: Diagnosis not present

## 2013-09-24 DIAGNOSIS — I4892 Unspecified atrial flutter: Secondary | ICD-10-CM

## 2013-09-24 DIAGNOSIS — I4891 Unspecified atrial fibrillation: Secondary | ICD-10-CM | POA: Diagnosis not present

## 2013-09-24 DIAGNOSIS — I482 Chronic atrial fibrillation, unspecified: Secondary | ICD-10-CM

## 2013-09-24 DIAGNOSIS — Z7901 Long term (current) use of anticoagulants: Secondary | ICD-10-CM

## 2013-09-24 LAB — POCT INR: INR: 3.7

## 2013-09-25 ENCOUNTER — Encounter: Payer: Self-pay | Admitting: Internal Medicine

## 2013-09-28 NOTE — Progress Notes (Signed)
Subjective:    Patient ID: [name], female    DOB: 05-24-1921, 80 y.o.   MRN: [id number]  HPI 80 year old white female with multiple medical issues in today for health maintenance and evaluation of medical problems. She was admitted to the hospital in March 2015 after presenting here with dyspnea. Was found to have bilateral pleural effusions atrial fibrillation with rapid ventricular response here in the office and was sent on to the hospital. She has a history of a pacemaker. She is on chronic Coumadin therapy. She has a history of hypertension. Pacemaker was placed for sick sinus syndrome in 2005.  She has a history of GE reflux. Had esophageal stricture dilated in 1996. History of recurrent benign positional vertigo, stress urinary incontinence, osteoarthritis, chronic kidney disease, hearing loss in both the ears, anemia. History of anxiety. History of Schatzki's ring 1992. Long-standing history of hypothyroidism.  She had colonoscopy in 2001 and that will not be repeated because of her age. Cataract extraction of the right eye in 1992 in the left eye in 1993. History of right middle lobe pneumonia 1996.  History of essential tremor that started in the early 1990s.  Social history: She continues to reside alone. She is a widow. She is retired Chief Technology Officer. She has maintained good physical conditioning. Formerly smoked a pack of cigarettes daily for some 5 years but quit in the 1960s. Has a daughter who resides in [place]. Son resides in [place].  Family history: Father died at age 28 of acute leukemia. Mother died at age 70 of kidney failure. One brother died at age 77 of cirrhosis of the liver. One sister.    Review of Systems  Constitutional:       Tires easily and ambulates with a cane  HENT:       Has bilateral hearing aids. History of macular degeneration.  Gastrointestinal:       GE reflux  Endocrine:       Hypothyroidism  Neurological:       History  of benign positional vertigo which is recurrent. Also has a central tremor.  Psychiatric/Behavioral:       History of anxiety       Objective:   Physical Exam  Vitals reviewed. Constitutional: She is oriented to person, place, and time. She appears well-developed and well-nourished. No distress.  HENT:  Head: Normocephalic.  Right Ear: External ear normal.  Left Ear: External ear normal.  Mouth/Throat: No oropharyngeal exudate.  Bilateral hearing aids  Eyes: Conjunctivae are normal. Pupils are equal, round, and reactive to light. Right eye exhibits no discharge. Left eye exhibits no discharge. No scleral icterus.  Neck: Neck supple. No JVD present. No thyromegaly present.  Cardiovascular: Normal heart sounds and intact distal pulses.   No murmur heard. Irregular irregular rhythm pulse 76  Pulmonary/Chest: Effort normal and breath sounds normal. She has no wheezes. She has no rales.  Breasts normal female  Abdominal: Soft. Bowel sounds are normal. She exhibits no distension and no mass. There is no rebound and no guarding.  Genitourinary:  Deferred  Musculoskeletal: She exhibits no edema.  Lymphadenopathy:    She has no cervical adenopathy.  Neurological: She is alert and oriented to person, place, and time. She has normal reflexes. No cranial nerve deficit. Coordination normal.  Resting tremor  Skin: Skin is warm and dry. She is not diaphoretic.  Psychiatric: She has a normal mood and affect. Her behavior is normal. Judgment and thought content normal.  Assessment & Plan:  Chronic atrial fibrillation with rate control presently. Is on chronic Coumadin therapy.  History of congestive heart failure secondary to atrial fibrillation with rapid ventricular response March 20 15th  History of hypertension  Chronic kidney disease stage III  History of benign positional vertigo  GE reflux  Hearing loss both ears  HypothyroidismTSH stable on  replacement  Essential tremor  Macular degeneration  History of anxiety  GE reflux- on PPI  Has pacemaker  Plan: All of these medical problems are stable at the present time. She looks well today and much better than she did in March when she presented here in acute respiratory distress due to congestive heart failure from atrial fib with rapid ventricular spots. She is to continue same medications and return here in 6 months.  Subjective:   Patient presents for Medicare Annual/Subsequent preventive examination.  Review Past Medical/Family/Social: See above  Risk Factors  Current exercise habits: sedentary but walks some Dietary issues discussed: low fat low carb  Cardiac risk factors:HTN  Depression Screen  (Note: if answer to either of the following is "Yes", a more complete depression screening is indicated)   Over the past two weeks, have you felt down, depressed or hopeless? No  Over the past two weeks, have you felt little interest or pleasure in doing things? No Have you lost interest or pleasure in daily life? No Do you often feel hopeless? No Do you cry easily over simple problems? No   Activities of Daily Living  In your present state of health, do you have any difficulty performing the following activities?:   Driving? No  Managing money? No  Feeding yourself? No  Getting from bed to chair? No  Climbing a flight of stairs? yes Preparing food and eating?: No  Bathing or showering? No  Getting dressed: No  Getting to the toilet? No  Using the toilet:No  Moving around from place to place: No  In the past year have you fallen or had a near fall?:yes Are you sexually active? No  Do you have more than one partner? No   Hearing Difficulties: No  Do you often ask people to speak up or repeat themselves? yes Do you experience ringing or noises in your ears? yes Do you have difficulty understanding soft or whispered voices? yes Do you feel that you have a  problem with memory? yes Do you often misplace items? yes   Home Safety:  Do you have a smoke alarm at your residence? Yes Do you have grab bars in the bathroom?yes Do you have throw rugs in your house?no   Cognitive Testing  Alert? Yes Normal Appearance?Yes  Oriented to person? Yes Place? Yes  Time? Yes  Recall of three objects? Yes  Can perform simple calculations? Yes  Displays appropriate judgment?Yes  Can read the correct time from a watch face?Yes   List the Names of Other Physician/Practitioners you currently use:  See referral list for the physicians patient is currently seeing.  Dr. Georgeann Oppenheim; eye physician   Review of Systems:see above   Objective:     General appearance: Appears stated age  Head: Normocephalic, without obvious abnormality, atraumatic  Eyes: conj clear, EOMi PEERLA  Ears: normal TM's and external ear canals both ears  Nose: Nares normal. Septum midline. Mucosa normal. No drainage or sinus tenderness.  Throat: lips, mucosa, and tongue normal; teeth and gums normal  Neck: no adenopathy, no carotid bruit, no JVD, supple, symmetrical, trachea midline and thyroid  not enlarged, symmetric, no tenderness/mass/nodules  No CVA tenderness.  Lungs: clear to auscultation bilaterally  Breasts: normal appearance, no masses or tenderness, top of the pacemaker on left upper chest.  Heart: regular rate and rhythm, S1, S2 normal, no murmur, click, rub or gallop  Abdomen: soft, non-tender; bowel sounds normal; no masses, no organomegaly  Musculoskeletal: ROM normal in all joints, no crepitus, no deformity, Normal muscle strengthen. Back  is symmetric, no curvature. Skin: Skin color, texture, turgor normal. No rashes or lesions  Lymph nodes: Cervical, supraclavicular, and axillary nodes normal.  Neurologic: CN 2 -12 Normal, Normal symmetric reflexes. Normal coordination and gait  Psych: Alert & Oriented x 3, Mood appear stable.    Assessment:     Annual wellness medicare exam   Plan:    During the course of the visit the patient was educated and counseled about appropriate screening and preventive services including:        Patient Instructions (the written plan) was given to the patient.  Medicare Attestation  I have personally reviewed:  The patient's medical and social history  Their use of alcohol, tobacco or illicit drugs  Their current medications and supplements  The patient's functional ability including ADLs,fall risks, home safety risks, cognitive, and hearing and visual impairment  Diet and physical activities  Evidence for depression or mood disorders  The patient's weight, height, BMI, and visual acuity have been recorded in the chart. I have made referrals, counseling, and provided education to the patient based on review of the above and I have provided the patient with a written personalized care plan for preventive services.

## 2013-10-06 IMAGING — CR DG CHEST 2V
2 series · 2 of 2 positions shown · non-contrast
Comparison: 09/15/2010

CLINICAL DATA: Cough.

CHEST - 2 VIEW

[view not recorded (1 of 2)]
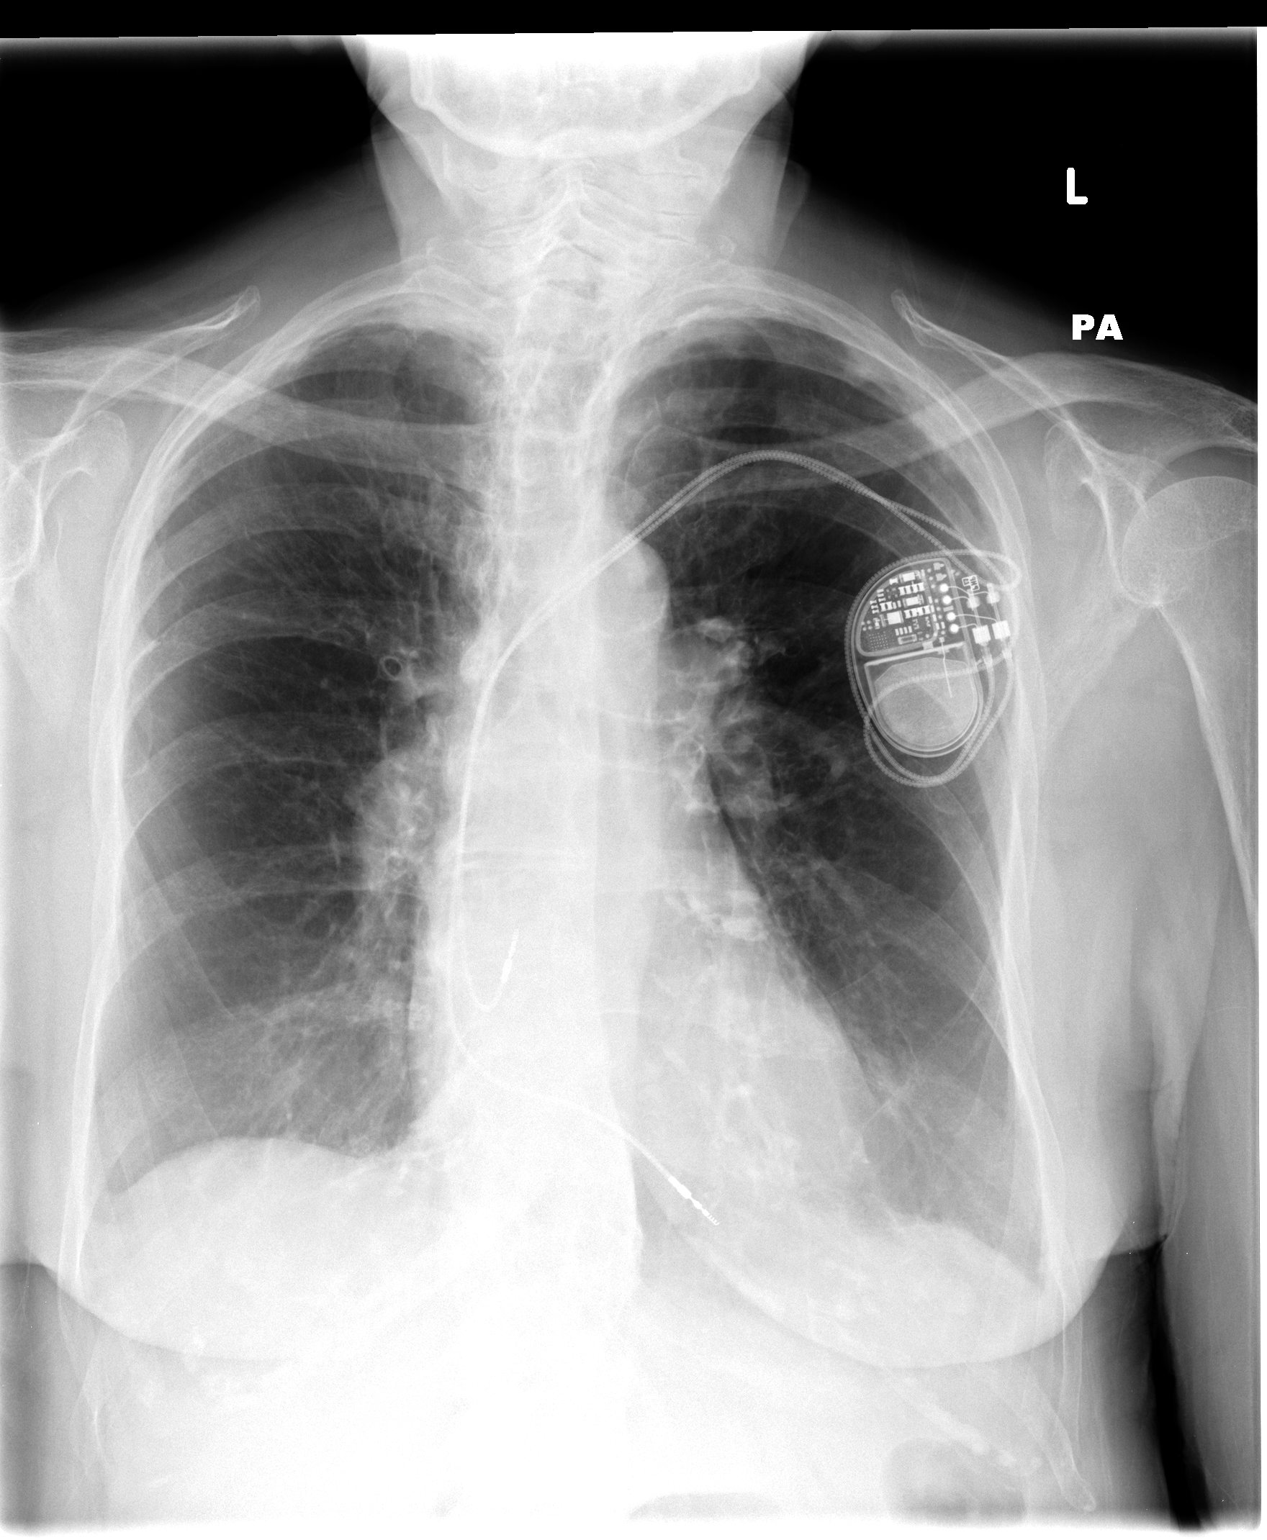

[view not recorded (2 of 2)]
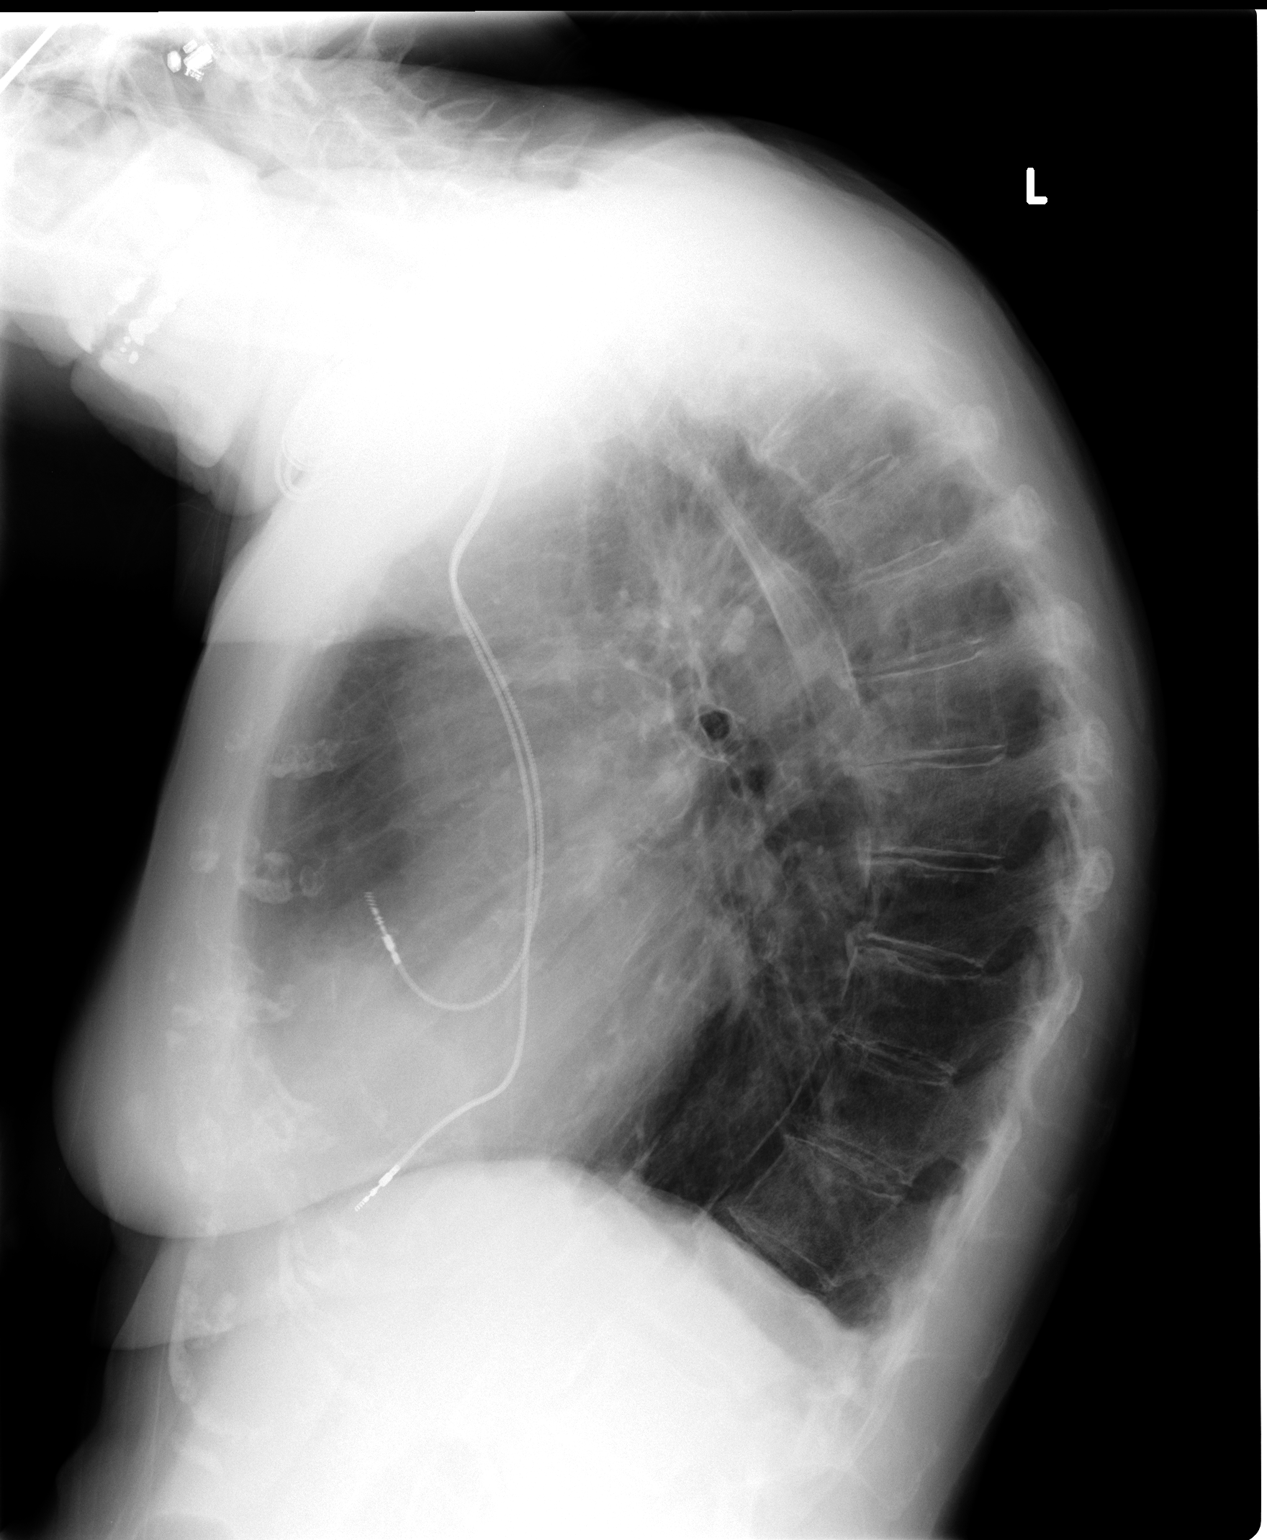

[2 of 2 positions shown; findings below may reference images not displayed]

FINDINGS: Stable COPD.  Stable appearance of cardiac pacemaker.  No
edema, infiltrate, nodule or pleural fluid is identified.  Heart
size is within normal limits.  Bony thorax shows stable osteopenia
and degenerative changes of the thoracic spine.
IMPRESSION: Stable COPD.  No acute findings.

## 2013-10-08 ENCOUNTER — Ambulatory Visit (INDEPENDENT_AMBULATORY_CARE_PROVIDER_SITE_OTHER): Payer: Medicare Other | Admitting: *Deleted

## 2013-10-08 DIAGNOSIS — I4892 Unspecified atrial flutter: Secondary | ICD-10-CM | POA: Diagnosis not present

## 2013-10-08 DIAGNOSIS — I482 Chronic atrial fibrillation, unspecified: Secondary | ICD-10-CM

## 2013-10-08 DIAGNOSIS — Z7901 Long term (current) use of anticoagulants: Secondary | ICD-10-CM | POA: Diagnosis not present

## 2013-10-08 DIAGNOSIS — I4891 Unspecified atrial fibrillation: Secondary | ICD-10-CM | POA: Diagnosis not present

## 2013-10-08 DIAGNOSIS — Z5181 Encounter for therapeutic drug level monitoring: Secondary | ICD-10-CM | POA: Diagnosis not present

## 2013-10-08 LAB — POCT INR: INR: 4.1

## 2013-10-13 ENCOUNTER — Encounter: Payer: Self-pay | Admitting: Cardiology

## 2013-10-13 ENCOUNTER — Ambulatory Visit (INDEPENDENT_AMBULATORY_CARE_PROVIDER_SITE_OTHER): Payer: Medicare Other | Admitting: Cardiology

## 2013-10-13 VITALS — BP 128/68 | HR 83 | Ht 64.0 in | Wt 134.3 lb

## 2013-10-13 DIAGNOSIS — I4891 Unspecified atrial fibrillation: Secondary | ICD-10-CM

## 2013-10-13 DIAGNOSIS — I482 Chronic atrial fibrillation, unspecified: Secondary | ICD-10-CM

## 2013-10-13 DIAGNOSIS — I10 Essential (primary) hypertension: Secondary | ICD-10-CM | POA: Diagnosis not present

## 2013-10-13 NOTE — Progress Notes (Signed)
HPI The patient presents for follow up.  Since I last saw her she has done well she has slowed down with her advancing age but she's not having any hot flashes or significant shortness of breath and she was having previously. She doesn't notice that she is persistently in atrial fibrillation and she appears to be. She has had some trouble with her work and levels being high recently and this is being followed. However, she's not had any bleeding or bruising.  No Known Allergies  Current Outpatient Prescriptions  Medication Sig Dispense Refill  . ALPRAZolam (XANAX) 0.5 MG tablet TAKE 1 TABLET 3 TIMES A DAY AS NEEDED  90 tablet  4  . Cholecalciferol (VITAMIN D3) 1000 UNITS CAPS Take 1 capsule by mouth daily.       . cycloSPORINE (RESTASIS) 0.05 % ophthalmic emulsion Place 1 drop into both eyes 2 (two) times daily.       Marland Kitchen diltiazem (CARDIZEM CD) 120 MG 24 hr capsule Take 1 capsule (120 mg total) by mouth daily.  30 capsule  5  . esomeprazole (NEXIUM) 40 MG capsule Take 1 capsule (40 mg total) by mouth daily before breakfast.  90 capsule  3  . ferrous sulfate 325 (65 FE) MG tablet Take 325 mg by mouth daily with breakfast.        . Fluticasone-Salmeterol (ADVAIR DISKUS) 250-50 MCG/DOSE AEPB Inhale 1 puff into the lungs every 12 (twelve) hours.  60 each  5  . furosemide (LASIX) 20 MG tablet 1 tablet daily  30 tablet  3  . levothyroxine (SYNTHROID, LEVOTHROID) 88 MCG tablet Take 88 mcg by mouth every evening.      . metoprolol (LOPRESSOR) 100 MG tablet Take 1 tablet (100 mg total) by mouth 2 (two) times daily.  60 tablet  5  . Multiple Vitamins-Minerals (ICAPS) CAPS Take 1 capsule by mouth 2 (two) times daily.       . Potassium Chloride ER 20 MEQ TBCR Take 20 mEq by mouth daily.  30 tablet  3  . psyllium (METAMUCIL) 58.6 % powder Take 1 packet by mouth every evening.       . warfarin (COUMADIN) 4 MG tablet Take 4-6 mg by mouth daily. Take 4mg  daily except 6mg  on Tuesdays       Current  Facility-Administered Medications  Medication Dose Route Frequency Provider Last Rate Last Dose  . TDaP (BOOSTRIX) injection 0.5 mL  0.5 mL Intramuscular Once Elby Showers, MD        Past Medical History  Diagnosis Date  . Symptomatic sinus bradycardia     s/p pacemaker  . Atrial flutter     s/p RFCA  . Atrial fibrillation     On warfarin  . Hypothyroidism   . GERD (gastroesophageal reflux disease)   . Esophageal stricture   . Anemia   . Asthma   . Tremor, essential   . Diverticulosis   . Vertigo   . Urinary, incontinence, stress female   . DJD (degenerative joint disease)     knees  . Renal insufficiency   . Hearing loss   . Macular degeneration   . HTN (hypertension)     a.  echo 3/06: EF 55-65%, mild LVH, mild RVH;   b.  Echo (06/10/13): EF 50-55%, normal wall motion, MAC, mild MR, mild RPE, PASP 40.    Past Surgical History  Procedure Laterality Date  . Pacemaker insertion    . Radiofrequency catheter ablation    .  Transthoracic echocardiogram  06/10/2013    EF 50-55% Mild MR    ROS:  As stated in the HPI and negative for all other systems.  PHYSICAL EXAM BP 128/68  Pulse 83  Ht 5\' 4"  (1.626 m)  Wt 134 lb 5 oz (60.924 kg)  BMI 23.04 kg/m2 GENERAL:  Well appearing, but frail NECK:  No jugular venous distention, waveform within normal limits, carotid upstroke brisk and symmetric, no bruits, no thyromegaly LUNGS:  Clear to auscultation bilaterally BACK:  No CVA tenderness, lordosis CHEST:  Pacer pocket OK. HEART:  PMI not displaced or sustained,S1 and S2 within normal limits, no S3,  no clicks, no rubs, no murmurs, irregular ABD:  Flat, positive bowel sounds normal in frequency in pitch, no bruits, no rebound, no guarding, no midline pulsatile mass, no hepatomegaly, no splenomegaly EXT:  2 plus pulses throughout, mild edema, no cyanosis no clubbing  EKG:  Atrial fibrillation rate 83, no acute ST-T wave changes. 10/13/2013  ASSESSMENT AND PLAN  ATRIAL  FIBRILLATION -  She seems to be and has persistently. She tolerates anticoagulation. However, there's been a little difficulty regulating his recently. If this continues she might want to switch to Eliquis.  At that point the Coumadin clinic can notify me to make the switch.  CARDIAC PACEMAKER IN SITU -  She is having this followed closely in the pacemaker clinic.  HYPERTENSION -  The blood pressure is at target. No change in medications is indicated. We will continue with therapeutic lifestyle changes (TLC).

## 2013-10-13 NOTE — Patient Instructions (Signed)
Your physician recommends that you schedule a follow-up appointment in: one year with Dr. Hochrein  

## 2013-10-16 ENCOUNTER — Ambulatory Visit (INDEPENDENT_AMBULATORY_CARE_PROVIDER_SITE_OTHER): Payer: Medicare Other | Admitting: *Deleted

## 2013-10-16 DIAGNOSIS — I498 Other specified cardiac arrhythmias: Secondary | ICD-10-CM

## 2013-10-16 LAB — MDC_IDC_ENUM_SESS_TYPE_REMOTE
Brady Statistic AS VS Percent: 81.58 %
Brady Statistic RA Percent Paced: 2.74 %
Lead Channel Impedance Value: 392 Ohm
Lead Channel Impedance Value: 536 Ohm
Lead Channel Sensing Intrinsic Amplitude: 7.8899
Lead Channel Setting Pacing Amplitude: 2 V
Lead Channel Setting Pacing Amplitude: 3 V
Lead Channel Setting Sensing Sensitivity: 0.9 mV
MDC IDC MSMT BATTERY VOLTAGE: 2.82 V
MDC IDC SESS DTM: 20150716140649
MDC IDC SET LEADCHNL RV PACING PULSEWIDTH: 0.4 ms
MDC IDC SET ZONE DETECTION INTERVAL: 400 ms
MDC IDC STAT BRADY AP VP PERCENT: 1.36 %
MDC IDC STAT BRADY AP VS PERCENT: 1.39 %
MDC IDC STAT BRADY AS VP PERCENT: 15.68 %
MDC IDC STAT BRADY RV PERCENT PACED: 17.04 %
Zone Setting Detection Interval: 350 ms

## 2013-10-16 NOTE — Progress Notes (Signed)
Remote pacemaker transmission.   

## 2013-10-20 ENCOUNTER — Telehealth: Payer: Self-pay | Admitting: Cardiology

## 2013-10-20 NOTE — Telephone Encounter (Signed)
Calling because she has a question about her visit from last week with Dr.Hochrein. States he suggested  a medication and cannot seem to remember what the medication is .Marland Kitchen Please call   Thanks

## 2013-10-20 NOTE — Telephone Encounter (Signed)
Spoke with pt, aware he is considering eliquis. She is going to contact her insurance about this med.

## 2013-10-20 NOTE — Telephone Encounter (Signed)
Pt says she need to talk to you again

## 2013-10-20 NOTE — Telephone Encounter (Signed)
Pt. Called and questions answered

## 2013-10-22 ENCOUNTER — Ambulatory Visit (INDEPENDENT_AMBULATORY_CARE_PROVIDER_SITE_OTHER): Payer: Medicare Other | Admitting: *Deleted

## 2013-10-22 DIAGNOSIS — I4892 Unspecified atrial flutter: Secondary | ICD-10-CM | POA: Diagnosis not present

## 2013-10-22 DIAGNOSIS — I4891 Unspecified atrial fibrillation: Secondary | ICD-10-CM

## 2013-10-22 DIAGNOSIS — I482 Chronic atrial fibrillation, unspecified: Secondary | ICD-10-CM

## 2013-10-22 DIAGNOSIS — Z5181 Encounter for therapeutic drug level monitoring: Secondary | ICD-10-CM

## 2013-10-22 DIAGNOSIS — Z7901 Long term (current) use of anticoagulants: Secondary | ICD-10-CM

## 2013-10-22 LAB — POCT INR: INR: 1.9

## 2013-10-28 ENCOUNTER — Telehealth: Payer: Self-pay | Admitting: Cardiology

## 2013-10-28 NOTE — Telephone Encounter (Signed)
Pt. States she will be in the coumadin clinic on the 5th of August and wants to switch to eliquis on that date . All this came about with pts last visit with Dr. Percival Spanish

## 2013-10-28 NOTE — Telephone Encounter (Signed)
Pt says she wants to talk to you about changing to Eliqius. Pt says she will be out from 12:30 to 1:30.

## 2013-11-05 ENCOUNTER — Other Ambulatory Visit: Payer: Self-pay

## 2013-11-05 ENCOUNTER — Ambulatory Visit (INDEPENDENT_AMBULATORY_CARE_PROVIDER_SITE_OTHER): Payer: Medicare Other | Admitting: Pharmacist

## 2013-11-05 DIAGNOSIS — Z5181 Encounter for therapeutic drug level monitoring: Secondary | ICD-10-CM | POA: Diagnosis not present

## 2013-11-05 DIAGNOSIS — I5032 Chronic diastolic (congestive) heart failure: Secondary | ICD-10-CM

## 2013-11-05 DIAGNOSIS — I4892 Unspecified atrial flutter: Secondary | ICD-10-CM | POA: Diagnosis not present

## 2013-11-05 DIAGNOSIS — I4891 Unspecified atrial fibrillation: Secondary | ICD-10-CM

## 2013-11-05 DIAGNOSIS — I482 Chronic atrial fibrillation, unspecified: Secondary | ICD-10-CM

## 2013-11-05 DIAGNOSIS — Z7901 Long term (current) use of anticoagulants: Secondary | ICD-10-CM

## 2013-11-05 DIAGNOSIS — I10 Essential (primary) hypertension: Secondary | ICD-10-CM

## 2013-11-05 DIAGNOSIS — I5031 Acute diastolic (congestive) heart failure: Secondary | ICD-10-CM

## 2013-11-05 LAB — POCT INR: INR: 2.2

## 2013-11-05 MED ORDER — APIXABAN 2.5 MG PO TABS: 2.5000 mg | ORAL_TABLET | Freq: Two times a day (BID) | ORAL | Status: DC

## 2013-11-05 MED ORDER — FUROSEMIDE 20 MG PO TABS: ORAL_TABLET | ORAL | Status: DC

## 2013-11-07 ENCOUNTER — Encounter: Payer: Self-pay | Admitting: Cardiology

## 2013-11-11 ENCOUNTER — Other Ambulatory Visit: Payer: Self-pay

## 2013-11-11 DIAGNOSIS — I5032 Chronic diastolic (congestive) heart failure: Secondary | ICD-10-CM

## 2013-11-11 DIAGNOSIS — I5031 Acute diastolic (congestive) heart failure: Secondary | ICD-10-CM

## 2013-11-11 DIAGNOSIS — I10 Essential (primary) hypertension: Secondary | ICD-10-CM

## 2013-11-11 MED ORDER — POTASSIUM CHLORIDE ER 20 MEQ PO TBCR: 20.0000 meq | EXTENDED_RELEASE_TABLET | Freq: Every day | ORAL | Status: DC

## 2013-11-12 ENCOUNTER — Encounter: Payer: Self-pay | Admitting: Internal Medicine

## 2013-11-19 ENCOUNTER — Ambulatory Visit (INDEPENDENT_AMBULATORY_CARE_PROVIDER_SITE_OTHER): Payer: Medicare Other | Admitting: *Deleted

## 2013-11-19 ENCOUNTER — Telehealth: Payer: Self-pay | Admitting: Cardiology

## 2013-11-19 DIAGNOSIS — I495 Sick sinus syndrome: Secondary | ICD-10-CM

## 2013-11-19 DIAGNOSIS — I4891 Unspecified atrial fibrillation: Secondary | ICD-10-CM

## 2013-11-19 DIAGNOSIS — I498 Other specified cardiac arrhythmias: Secondary | ICD-10-CM | POA: Diagnosis not present

## 2013-11-19 DIAGNOSIS — I482 Chronic atrial fibrillation, unspecified: Secondary | ICD-10-CM

## 2013-11-19 NOTE — Telephone Encounter (Signed)
Spoke with pt and reminded pt of remote transmission that is due today. Pt verbalized understanding.   

## 2013-11-19 NOTE — Progress Notes (Signed)
Remote pacemaker transmission.   

## 2013-11-20 ENCOUNTER — Other Ambulatory Visit: Payer: Self-pay | Admitting: *Deleted

## 2013-11-20 MED ORDER — ESOMEPRAZOLE MAGNESIUM 40 MG PO CPDR: 40.0000 mg | DELAYED_RELEASE_CAPSULE | Freq: Every day | ORAL | Status: DC

## 2013-11-20 NOTE — Telephone Encounter (Signed)
rx sent to pharmacy by e-script  

## 2013-11-27 LAB — MDC_IDC_ENUM_SESS_TYPE_REMOTE
Battery Voltage: 2.82 V
Brady Statistic AP VP Percent: 0.96 %
Brady Statistic AP VS Percent: 1.07 %
Brady Statistic RA Percent Paced: 2.03 %
Lead Channel Impedance Value: 384 Ohm
Lead Channel Impedance Value: 552 Ohm
Lead Channel Sensing Intrinsic Amplitude: 7.5469
Lead Channel Setting Pacing Amplitude: 3 V
Lead Channel Setting Pacing Pulse Width: 0.4 ms
MDC IDC SESS DTM: 20150819145547
MDC IDC SET LEADCHNL RV SENSING SENSITIVITY: 0.9 mV
MDC IDC SET ZONE DETECTION INTERVAL: 350 ms
MDC IDC SET ZONE DETECTION INTERVAL: 400 ms
MDC IDC STAT BRADY AS VP PERCENT: 15.62 %
MDC IDC STAT BRADY AS VS PERCENT: 82.35 %
MDC IDC STAT BRADY RV PERCENT PACED: 16.58 %

## 2013-11-29 ENCOUNTER — Other Ambulatory Visit (HOSPITAL_COMMUNITY): Payer: Self-pay | Admitting: Cardiology

## 2013-12-01 NOTE — Telephone Encounter (Signed)
Rx refill sent to patient pharmacy   

## 2013-12-03 ENCOUNTER — Ambulatory Visit (INDEPENDENT_AMBULATORY_CARE_PROVIDER_SITE_OTHER): Payer: Medicare Other | Admitting: Internal Medicine

## 2013-12-03 ENCOUNTER — Encounter: Payer: Self-pay | Admitting: Internal Medicine

## 2013-12-03 ENCOUNTER — Ambulatory Visit (INDEPENDENT_AMBULATORY_CARE_PROVIDER_SITE_OTHER): Payer: Medicare Other | Admitting: *Deleted

## 2013-12-03 ENCOUNTER — Encounter: Payer: Self-pay | Admitting: *Deleted

## 2013-12-03 VITALS — BP 112/72 | HR 61 | Ht 64.0 in | Wt 131.0 lb

## 2013-12-03 DIAGNOSIS — I4891 Unspecified atrial fibrillation: Secondary | ICD-10-CM

## 2013-12-03 DIAGNOSIS — I119 Hypertensive heart disease without heart failure: Secondary | ICD-10-CM

## 2013-12-03 DIAGNOSIS — Z5181 Encounter for therapeutic drug level monitoring: Secondary | ICD-10-CM

## 2013-12-03 DIAGNOSIS — Z950 Presence of cardiac pacemaker: Secondary | ICD-10-CM

## 2013-12-03 DIAGNOSIS — I482 Chronic atrial fibrillation, unspecified: Secondary | ICD-10-CM

## 2013-12-03 DIAGNOSIS — I495 Sick sinus syndrome: Secondary | ICD-10-CM | POA: Diagnosis not present

## 2013-12-03 DIAGNOSIS — Z01812 Encounter for preprocedural laboratory examination: Secondary | ICD-10-CM

## 2013-12-03 LAB — MDC_IDC_ENUM_SESS_TYPE_INCLINIC
Brady Statistic AP VP Percent: 0.59 %
Brady Statistic AP VS Percent: 1.74 %
Brady Statistic AS VS Percent: 80.78 %
Lead Channel Impedance Value: 552 Ohm
Lead Channel Sensing Intrinsic Amplitude: 7.8899
Lead Channel Setting Pacing Amplitude: 2 V
Lead Channel Setting Pacing Amplitude: 3 V
Lead Channel Setting Pacing Pulse Width: 0.4 ms
Lead Channel Setting Sensing Sensitivity: 0.9 mV
MDC IDC MSMT BATTERY VOLTAGE: 2.81 V
MDC IDC MSMT LEADCHNL RV IMPEDANCE VALUE: 384 Ohm
MDC IDC SESS DTM: 20150902165654
MDC IDC STAT BRADY AS VP PERCENT: 16.89 %
MDC IDC STAT BRADY RA PERCENT PACED: 2.33 %
MDC IDC STAT BRADY RV PERCENT PACED: 17.48 %
Zone Setting Detection Interval: 350 ms
Zone Setting Detection Interval: 400 ms

## 2013-12-03 NOTE — Progress Notes (Signed)
HPI [name] returns today for followup. She is a very pleasant 80 year old woman with a history of symptomatic bradycardia, paroxysmal atrial fibrillation, status post permanent pacemaker insertion. In the interim, she has done well. She does have palpitations in the morning. She now has chronic atrial fib and is asymptomatic. She denies syncope. She is only taking one dose of short acting beta blocker a day. She has reached ERI. No Known Allergies   Current Outpatient Prescriptions  Medication Sig Dispense Refill  . ALPRAZolam (XANAX) 0.5 MG tablet TAKE 1 TABLET 3 TIMES A DAY AS NEEDED  90 tablet  4  . apixaban (ELIQUIS) 2.5 MG TABS tablet Take 1 tablet (2.5 mg total) by mouth 2 (two) times daily.  180 tablet  2  . Cholecalciferol (VITAMIN D3) 1000 UNITS CAPS Take 1 capsule by mouth daily.       . cycloSPORINE (RESTASIS) 0.05 % ophthalmic emulsion Place 1 drop into both eyes 2 (two) times daily.       Marland Kitchen diltiazem (CARDIZEM CD) 120 MG 24 hr capsule TAKE ONE CAPSULE BY MOUTH EVERY DAY  30 capsule  5  . esomeprazole (NEXIUM) 40 MG capsule Take 1 capsule (40 mg total) by mouth daily before breakfast.  90 capsule  3  . ferrous sulfate 325 (65 FE) MG tablet Take 325 mg by mouth daily with breakfast.        . Fluticasone-Salmeterol (ADVAIR DISKUS) 250-50 MCG/DOSE AEPB Inhale 1 puff into the lungs every 12 (twelve) hours.  60 each  5  . furosemide (LASIX) 20 MG tablet 1 tablet daily  30 tablet  11  . levothyroxine (SYNTHROID, LEVOTHROID) 88 MCG tablet Take 88 mcg by mouth every evening.      . metoprolol (LOPRESSOR) 100 MG tablet Take 1 tablet (100 mg total) by mouth 2 (two) times daily.  60 tablet  5  . Multiple Vitamins-Minerals (ICAPS) CAPS Take 1 capsule by mouth 2 (two) times daily.       . Potassium Chloride ER 20 MEQ TBCR Take 20 mEq by mouth daily.  30 tablet  6  . psyllium (METAMUCIL) 58.6 % powder Take 1 packet by mouth every evening.        Current Facility-Administered Medications   Medication Dose Route Frequency Provider Last Rate Last Dose  . TDaP (BOOSTRIX) injection 0.5 mL  0.5 mL Intramuscular Once Elby Showers, MD         Past Medical History  Diagnosis Date  . Symptomatic sinus bradycardia     s/p pacemaker  . Atrial flutter     s/p RFCA  . Atrial fibrillation     On warfarin  . Hypothyroidism   . GERD (gastroesophageal reflux disease)   . Esophageal stricture   . Anemia   . Asthma   . Tremor, essential   . Diverticulosis   . Vertigo   . Urinary, incontinence, stress female   . DJD (degenerative joint disease)     knees  . Renal insufficiency   . Hearing loss   . Macular degeneration   . HTN (hypertension)     a.  echo 3/06: EF 55-65%, mild LVH, mild RVH;   b.  Echo (06/10/13): EF 50-55%, normal wall motion, MAC, mild MR, mild RPE, PASP 40.    ROS:   All systems reviewed and negative except as noted in the HPI.   Past Surgical History  Procedure Laterality Date  . Pacemaker insertion    . Radiofrequency catheter ablation    .  Transthoracic echocardiogram  06/10/2013    EF 50-55% Mild MR     Family History  Problem Relation Age of Onset  . Cancer Mother     breast  . Cancer Father     leukemia  . Cirrhosis Brother     deceased     History   Social History  . Marital Status: Widowed    Spouse Name: N/A    Number of Children: 2  . Years of Education: N/A   Occupational History  . 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 Smoking status: Former Smoker    Types: Cigarettes    Quit date: 02/04/1962  . Smokeless tobacco: Never Used  . Alcohol Use: Yes     Comment: rarely  . Drug Use: No  . Sexual Activity: Not on file   Other Topics Concern  . Not on file   Social History Narrative  . No narrative on file     BP 112/72  Pulse 61  Ht 5\' 4"  (1.626 m)  Wt 131 lb (59.421 kg)  BMI 22.47 kg/m2  SpO2 97%  Physical Exam:  Well appearing 80 year old woman, NAD HEENT: Unremarkable Neck:  No JVD,  no thyromegally Lungs:  Clear with no wheezes, rales, or rhonchi. HEART:  IRegular rate rhythm, no murmurs, no rubs, no clicks Abd:  soft, positive bowel sounds, no organomegally, no rebound, no guarding Ext:  2 plus pulses, no edema, no cyanosis, no clubbing Skin:  No rashes no nodules Neuro:  CN II through XII intact, motor grossly intact  DEVICE  Normal device function.  See PaceArt for details.   Assess/Plan:

## 2013-12-03 NOTE — Progress Notes (Signed)
Pt was started on Eliquis  2.5mg  bid  for  Atrial Fib on 11/06/2013 .    Reviewed patients medication list.  Pt is not  currently on any combined P-gp and strong CYP3A4 inhibitors/inducers (ketoconazole, traconazole, ritonavir, carbamazepine, phenytoin, rifampin, St. John's wort).  Reviewed labs.  SCr 1.7, Weight 59.9 kg  .  Dose is appropriate based on .   Hgb 11.8  and HCT 35.3   A full discussion of the nature of anticoagulants has been carried out.  A benefit/risk analysis has been presented to the patient, so that they understand the justification for choosing anticoagulation with Eliquis at this time.  The need for compliance is stressed.  Pt is aware to take the medication twice daily.  Side effects of potential bleeding are discussed, including unusual colored urine or stools, coughing up blood or coffee ground emesis, nose bleeds or serious fall or head trauma.  Discussed signs and symptoms of stroke. The patient should avoid any OTC items containing aspirin or ibuprofen.  Avoid alcohol consumption.   Call if any signs of abnormal bleeding.  Discussed financial obligations and pt states is not having any difficulty in obtaining medication.  Next lab test test in 6 months.  Pt states she has had no problems in taking Eliquis and no sign or symptom of bleeding or stroke  To  Obtain CBC and BMET today  Pt scheduled for permanent pacemaker generator change and labs will be done next week 12/11/2013 Spoke with pt reviewed labs with her and instructed that she is on correct dose of Eliquis 2.5mg  bid and she states understanding  The pt states she understands information given regarding holding Eliquis  prior to generator change  on 12/15/2013 and she states understanding Appt made for recheck in 6 months and pt states understanding

## 2013-12-03 NOTE — Patient Instructions (Signed)
Your physician recommends that you continue on your current medications as directed. Please refer to the Current Medication list given to you today.  Your physician recommends that you return for pre-procedure lab work on 12/10/13.  Your physician has recommended that you have a pacemaker generator change. Please see the instruction sheet given to you today for more information.  Your wound check is scheduled for 12/25/13 at 12:00 pm at [address].

## 2013-12-03 NOTE — Assessment & Plan Note (Signed)
Her blood pressure is stable. Will continue her current meds.

## 2013-12-03 NOTE — Assessment & Plan Note (Signed)
Her Medtronic DDD PM is at [facility]. As she is now chronically in atrial fib, I would anticipate placing a VVI PM at her generator change out.

## 2013-12-04 ENCOUNTER — Telehealth: Payer: Self-pay | Admitting: Internal Medicine

## 2013-12-04 NOTE — Telephone Encounter (Signed)
Called patient back. Set up for a battery chage out. She is aware to hold Lasix day of procedure but not sure what to do about Eliquis. She is no longer on Coumadin. Advised will ask Dr. Lovena Le and call her back.

## 2013-12-04 NOTE — Telephone Encounter (Signed)
New message  Pt called to discuss her medication.. She is confused. She is not sure if she soul or should not come off of her coumadin and if she she take it with her lasix. Please call.

## 2013-12-04 NOTE — Telephone Encounter (Signed)
Discussed with Dr.Taylor and he advised that she hold Eliquis the pm prior to the procedure and the morning of the procedure. Patient aware and verbalized understanding.

## 2013-12-05 ENCOUNTER — Other Ambulatory Visit: Payer: Self-pay | Admitting: *Deleted

## 2013-12-05 ENCOUNTER — Encounter: Payer: Self-pay | Admitting: Internal Medicine

## 2013-12-05 DIAGNOSIS — I495 Sick sinus syndrome: Secondary | ICD-10-CM

## 2013-12-06 ENCOUNTER — Other Ambulatory Visit: Payer: Self-pay | Admitting: Internal Medicine

## 2013-12-10 ENCOUNTER — Other Ambulatory Visit (INDEPENDENT_AMBULATORY_CARE_PROVIDER_SITE_OTHER): Payer: Medicare Other

## 2013-12-10 ENCOUNTER — Encounter: Payer: Self-pay | Admitting: Internal Medicine

## 2013-12-10 DIAGNOSIS — I495 Sick sinus syndrome: Secondary | ICD-10-CM

## 2013-12-10 DIAGNOSIS — Z01812 Encounter for preprocedural laboratory examination: Secondary | ICD-10-CM | POA: Diagnosis not present

## 2013-12-10 LAB — CBC WITH DIFFERENTIAL/PLATELET
BASOS ABS: 0.1 10*3/uL (ref 0.0–0.1)
Basophils Relative: 0.8 % (ref 0.0–3.0)
Eosinophils Absolute: 0.6 10*3/uL (ref 0.0–0.7)
Eosinophils Relative: 6.8 % — ABNORMAL HIGH (ref 0.0–5.0)
HEMATOCRIT: 35.3 % — AB (ref 36.0–46.0)
Hemoglobin: 11.8 g/dL — ABNORMAL LOW (ref 12.0–15.0)
LYMPHS ABS: 1.8 10*3/uL (ref 0.7–4.0)
Lymphocytes Relative: 21.5 % (ref 12.0–46.0)
MCHC: 33.4 g/dL (ref 30.0–36.0)
MCV: 98.1 fl (ref 78.0–100.0)
MONO ABS: 0.8 10*3/uL (ref 0.1–1.0)
Monocytes Relative: 9.3 % (ref 3.0–12.0)
NEUTROS ABS: 5.1 10*3/uL (ref 1.4–7.7)
Neutrophils Relative %: 61.6 % (ref 43.0–77.0)
PLATELETS: 228 10*3/uL (ref 150.0–400.0)
RBC: 3.59 Mil/uL — ABNORMAL LOW (ref 3.87–5.11)
RDW: 14.4 % (ref 11.5–15.5)
WBC: 8.3 10*3/uL (ref 4.0–10.5)

## 2013-12-10 LAB — BASIC METABOLIC PANEL
BUN: 23 mg/dL (ref 6–23)
CHLORIDE: 103 meq/L (ref 96–112)
CO2: 24 mEq/L (ref 19–32)
Calcium: 9.4 mg/dL (ref 8.4–10.5)
Creatinine, Ser: 1.7 mg/dL — ABNORMAL HIGH (ref 0.4–1.2)
GFR: 29.43 mL/min — AB (ref >60.00)
Glucose, Bld: 73 mg/dL (ref 70–99)
Potassium: 4.2 mEq/L (ref 3.5–5.1)
Sodium: 138 mEq/L (ref 135–145)

## 2013-12-12 ENCOUNTER — Encounter (HOSPITAL_COMMUNITY): Payer: Self-pay | Admitting: Pharmacy Technician

## 2013-12-12 ENCOUNTER — Telehealth: Payer: Self-pay

## 2013-12-12 ENCOUNTER — Telehealth: Payer: Self-pay | Admitting: Internal Medicine

## 2013-12-12 ENCOUNTER — Other Ambulatory Visit: Payer: Self-pay

## 2013-12-12 MED ORDER — ALBUTEROL SULFATE HFA 108 (90 BASE) MCG/ACT IN AERS: 1.0000 | INHALATION_SPRAY | Freq: Four times a day (QID) | RESPIRATORY_TRACT | Status: DC | PRN

## 2013-12-12 MED ORDER — ALBUTEROL SULFATE HFA 108 (90 BASE) MCG/ACT IN AERS: 2.0000 | INHALATION_SPRAY | Freq: Four times a day (QID) | RESPIRATORY_TRACT | Status: DC

## 2013-12-12 NOTE — Telephone Encounter (Signed)
Refill x one year °

## 2013-12-12 NOTE — Telephone Encounter (Signed)
Patient called requesting advair refilled to [facility].

## 2013-12-12 NOTE — Telephone Encounter (Signed)
Advair refilled.

## 2013-12-12 NOTE — Telephone Encounter (Signed)
New message    Patient calling has questions regarding medication  - upcoming procedure.

## 2013-12-12 NOTE — Telephone Encounter (Signed)
Will hold all medications the morning of her procedure as she feels it will upset her stomach

## 2013-12-14 DIAGNOSIS — F430 Acute stress reaction: Secondary | ICD-10-CM | POA: Diagnosis not present

## 2013-12-14 DIAGNOSIS — I442 Atrioventricular block, complete: Secondary | ICD-10-CM | POA: Diagnosis not present

## 2013-12-14 DIAGNOSIS — Z79899 Other long term (current) drug therapy: Secondary | ICD-10-CM | POA: Diagnosis not present

## 2013-12-14 DIAGNOSIS — Z87891 Personal history of nicotine dependence: Secondary | ICD-10-CM | POA: Diagnosis not present

## 2013-12-14 DIAGNOSIS — I4891 Unspecified atrial fibrillation: Secondary | ICD-10-CM | POA: Diagnosis not present

## 2013-12-14 DIAGNOSIS — Z7901 Long term (current) use of anticoagulants: Secondary | ICD-10-CM | POA: Diagnosis not present

## 2013-12-14 DIAGNOSIS — M1710 Unilateral primary osteoarthritis, unspecified knee: Secondary | ICD-10-CM | POA: Diagnosis not present

## 2013-12-14 DIAGNOSIS — I10 Essential (primary) hypertension: Secondary | ICD-10-CM | POA: Diagnosis not present

## 2013-12-14 DIAGNOSIS — J45909 Unspecified asthma, uncomplicated: Secondary | ICD-10-CM | POA: Diagnosis not present

## 2013-12-14 DIAGNOSIS — D649 Anemia, unspecified: Secondary | ICD-10-CM | POA: Diagnosis not present

## 2013-12-14 DIAGNOSIS — I495 Sick sinus syndrome: Secondary | ICD-10-CM | POA: Diagnosis not present

## 2013-12-14 DIAGNOSIS — Z45018 Encounter for adjustment and management of other part of cardiac pacemaker: Secondary | ICD-10-CM | POA: Diagnosis not present

## 2013-12-14 DIAGNOSIS — E039 Hypothyroidism, unspecified: Secondary | ICD-10-CM | POA: Diagnosis not present

## 2013-12-14 MED ORDER — CHLORHEXIDINE GLUCONATE 4 % EX LIQD
60.0000 mL | Freq: Once | CUTANEOUS | Status: DC
  Filled 2013-12-14: qty 60

## 2013-12-14 MED ORDER — SODIUM CHLORIDE 0.9 % IV SOLN
INTRAVENOUS | Status: DC
  Administered 2013-12-15: 08:00:00 via INTRAVENOUS

## 2013-12-14 MED ORDER — CEFAZOLIN SODIUM-DEXTROSE 2-3 GM-% IV SOLR: 2.0000 g | INTRAVENOUS | Status: DC

## 2013-12-14 MED ORDER — SODIUM CHLORIDE 0.9 % IR SOLN
80.0000 mg | Status: DC
  Filled 2013-12-14: qty 2

## 2013-12-15 ENCOUNTER — Ambulatory Visit (HOSPITAL_COMMUNITY)
Admission: RE | Admit: 2013-12-15 | Discharge: 2013-12-15 | Disposition: A | Discharge status: Home / Self Care | Payer: Medicare Other | Source: Ambulatory Visit | Attending: Internal Medicine | Admitting: Internal Medicine

## 2013-12-15 ENCOUNTER — Encounter (HOSPITAL_COMMUNITY)
Admission: RE | Disposition: A | Discharge status: Home / Self Care | Payer: Self-pay | Source: Ambulatory Visit | Attending: Internal Medicine

## 2013-12-15 DIAGNOSIS — Z87891 Personal history of nicotine dependence: Secondary | ICD-10-CM | POA: Insufficient documentation

## 2013-12-15 DIAGNOSIS — E039 Hypothyroidism, unspecified: Secondary | ICD-10-CM | POA: Insufficient documentation

## 2013-12-15 DIAGNOSIS — F430 Acute stress reaction: Secondary | ICD-10-CM | POA: Insufficient documentation

## 2013-12-15 DIAGNOSIS — I4891 Unspecified atrial fibrillation: Secondary | ICD-10-CM | POA: Insufficient documentation

## 2013-12-15 DIAGNOSIS — M1710 Unilateral primary osteoarthritis, unspecified knee: Secondary | ICD-10-CM | POA: Insufficient documentation

## 2013-12-15 DIAGNOSIS — Z45018 Encounter for adjustment and management of other part of cardiac pacemaker: Secondary | ICD-10-CM | POA: Insufficient documentation

## 2013-12-15 DIAGNOSIS — J45909 Unspecified asthma, uncomplicated: Secondary | ICD-10-CM | POA: Insufficient documentation

## 2013-12-15 DIAGNOSIS — I10 Essential (primary) hypertension: Secondary | ICD-10-CM | POA: Insufficient documentation

## 2013-12-15 DIAGNOSIS — I442 Atrioventricular block, complete: Secondary | ICD-10-CM | POA: Insufficient documentation

## 2013-12-15 DIAGNOSIS — Z79899 Other long term (current) drug therapy: Secondary | ICD-10-CM | POA: Insufficient documentation

## 2013-12-15 DIAGNOSIS — I495 Sick sinus syndrome: Secondary | ICD-10-CM

## 2013-12-15 DIAGNOSIS — D649 Anemia, unspecified: Secondary | ICD-10-CM | POA: Insufficient documentation

## 2013-12-15 DIAGNOSIS — Z7901 Long term (current) use of anticoagulants: Secondary | ICD-10-CM | POA: Insufficient documentation

## 2013-12-15 HISTORY — PX: PERMANENT PACEMAKER GENERATOR CHANGE: SHX6022

## 2013-12-15 LAB — SURGICAL PCR SCREEN
MRSA, PCR: NEGATIVE
Staphylococcus aureus: NEGATIVE

## 2013-12-15 SURGERY — PERMANENT PACEMAKER GENERATOR CHANGE
Anesthesia: LOCAL

## 2013-12-15 MED ORDER — LIDOCAINE HCL (PF) 1 % IJ SOLN
INTRAMUSCULAR | Status: AC
  Filled 2013-12-15: qty 60

## 2013-12-15 MED ORDER — ACETAMINOPHEN 325 MG PO TABS
325.0000 mg | ORAL_TABLET | ORAL | Status: DC | PRN
  Filled 2013-12-15: qty 2

## 2013-12-15 MED ORDER — FENTANYL CITRATE 0.05 MG/ML IJ SOLN
INTRAMUSCULAR | Status: AC
  Filled 2013-12-15: qty 2

## 2013-12-15 MED ORDER — CEFAZOLIN SODIUM-DEXTROSE 2-3 GM-% IV SOLR
INTRAVENOUS | Status: AC
  Filled 2013-12-15: qty 50

## 2013-12-15 MED ORDER — MUPIROCIN 2 % EX OINT
TOPICAL_OINTMENT | CUTANEOUS | Status: AC
  Administered 2013-12-15: 1 via NASAL
  Filled 2013-12-15: qty 22

## 2013-12-15 MED ORDER — ONDANSETRON HCL 4 MG/2ML IJ SOLN: 4.0000 mg | Freq: Four times a day (QID) | INTRAMUSCULAR | Status: DC | PRN

## 2013-12-15 MED ORDER — MIDAZOLAM HCL 5 MG/5ML IJ SOLN
INTRAMUSCULAR | Status: AC
  Filled 2013-12-15: qty 5

## 2013-12-15 MED ORDER — MUPIROCIN 2 % EX OINT
TOPICAL_OINTMENT | Freq: Two times a day (BID) | CUTANEOUS | Status: DC
  Administered 2013-12-15: 1 via NASAL
  Filled 2013-12-15: qty 22

## 2013-12-15 NOTE — CV Procedure (Signed)
Electrophysiology procedure note  Procedure: Removal of a previous implanted dual-chamber pacemaker which had reached elective replacement, and insertion of a new dual-chamber pacemaker  Preoperative diagnoses: Symptomatic sinus node dysfunction with paroxysmal atrial fibrillation and intermittent complete heart block, status post pacemaker insertion, with the current device at elective replacement  Postoperative diagnosis: Same as preoperative diagnoses  Description of the procedure: After informed consent was obtained, the patient was taken to the diagnostic electrophysiology laboratory in the fasting state. After the usual preparation and draping, intravenous Versed and fentanyl were used for sedation. A time out was carried out. 30 cc of lidocaine was infiltrated into the left infraclavicular region. A 5 cm incision was carried out over the old pacemaker incision. Electrocautery was utilized to dissect down to the pacemaker pocket. The generator was removed with gentle traction. The atrial and ventricular leads were evaluated, and found to be working satisfactorily. P. And R waves were 3 and 10 mV respectively. The pacing impedance was 650 and 410 in the atrium and ventricle respectively. The ventricular pacing threshold was less than a volt 0.5 ms. The new Medtronic dual-chamber pacemaker, serial [id number] was connected to the old atrial and ventricular pacing lead and placed back in the subcutaneous pocket. The pocket was irrigated with antibiotic irrigation. The incision was closed with 2 layers of Vicryl suture. Benzoin and Steri-Strips for pain of the skin. Estimated blood loss was less than 5 cc. The patient was returned to room in satisfactory condition.  Complications: There were no immediate procedure all complications  Conclusion: Successful removal of a previous implanted dual-chamber pacemaker which was at elective replacement and insertion of a new dual-chamber pacemaker in a  patient with symptomatic sinus node dysfunction.  Cristopher Peru, M.D.

## 2013-12-15 NOTE — Interval H&P Note (Signed)
History and Physical Interval Note: The patient with symptomatic sinus node dysfunction and intermittant complete heart block presents today for PPM generator change out as her device is at ERI. No change in the history, physical exam, assessment and plan.   12/15/2013 8:49 AM  [name]  has presented today for surgery, with the diagnosis of battery depletion  The various methods of treatment have been discussed with the patient and family. After consideration of risks, benefits and other options for treatment, the patient has consented to  Procedure(s): PERMANENT PACEMAKER GENERATOR CHANGE (N/A) as a surgical intervention .  The patient's history has been reviewed, patient examined, no change in status, stable for surgery.  I have reviewed the patient's chart and labs.  Questions were answered to the patient's satisfaction.     [name].D.

## 2013-12-15 NOTE — H&P (View-Only) (Signed)
HPI [name] returns today for followup. She is a very pleasant 80 year old woman with a history of symptomatic bradycardia, paroxysmal atrial fibrillation, status post permanent pacemaker insertion. In the interim, she has done well. She does have palpitations in the morning. She now has chronic atrial fib and is asymptomatic. She denies syncope. She is only taking one dose of short acting beta blocker a day. She has reached ERI. No Known Allergies   Current Outpatient Prescriptions  Medication Sig Dispense Refill  . ALPRAZolam (XANAX) 0.5 MG tablet TAKE 1 TABLET 3 TIMES A DAY AS NEEDED  90 tablet  4  . apixaban (ELIQUIS) 2.5 MG TABS tablet Take 1 tablet (2.5 mg total) by mouth 2 (two) times daily.  180 tablet  2  . Cholecalciferol (VITAMIN D3) 1000 UNITS CAPS Take 1 capsule by mouth daily.       . cycloSPORINE (RESTASIS) 0.05 % ophthalmic emulsion Place 1 drop into both eyes 2 (two) times daily.       Marland Kitchen diltiazem (CARDIZEM CD) 120 MG 24 hr capsule TAKE ONE CAPSULE BY MOUTH EVERY DAY  30 capsule  5  . esomeprazole (NEXIUM) 40 MG capsule Take 1 capsule (40 mg total) by mouth daily before breakfast.  90 capsule  3  . ferrous sulfate 325 (65 FE) MG tablet Take 325 mg by mouth daily with breakfast.        . Fluticasone-Salmeterol (ADVAIR DISKUS) 250-50 MCG/DOSE AEPB Inhale 1 puff into the lungs every 12 (twelve) hours.  60 each  5  . furosemide (LASIX) 20 MG tablet 1 tablet daily  30 tablet  11  . levothyroxine (SYNTHROID, LEVOTHROID) 88 MCG tablet Take 88 mcg by mouth every evening.      . metoprolol (LOPRESSOR) 100 MG tablet Take 1 tablet (100 mg total) by mouth 2 (two) times daily.  60 tablet  5  . Multiple Vitamins-Minerals (ICAPS) CAPS Take 1 capsule by mouth 2 (two) times daily.       . Potassium Chloride ER 20 MEQ TBCR Take 20 mEq by mouth daily.  30 tablet  6  . psyllium (METAMUCIL) 58.6 % powder Take 1 packet by mouth every evening.        Current Facility-Administered Medications   Medication Dose Route Frequency Provider Last Rate Last Dose  . TDaP (BOOSTRIX) injection 0.5 mL  0.5 mL Intramuscular Once Elby Showers, MD         Past Medical History  Diagnosis Date  . Symptomatic sinus bradycardia     s/p pacemaker  . Atrial flutter     s/p RFCA  . Atrial fibrillation     On warfarin  . Hypothyroidism   . GERD (gastroesophageal reflux disease)   . Esophageal stricture   . Anemia   . Asthma   . Tremor, essential   . Diverticulosis   . Vertigo   . Urinary, incontinence, stress female   . DJD (degenerative joint disease)     knees  . Renal insufficiency   . Hearing loss   . Macular degeneration   . HTN (hypertension)     a.  echo 3/06: EF 55-65%, mild LVH, mild RVH;   b.  Echo (06/10/13): EF 50-55%, normal wall motion, MAC, mild MR, mild RPE, PASP 40.    ROS:   All systems reviewed and negative except as noted in the HPI.   Past Surgical History  Procedure Laterality Date  . Pacemaker insertion    . Radiofrequency catheter ablation    .  Transthoracic echocardiogram  06/10/2013    EF 50-55% Mild MR     Family History  Problem Relation Age of Onset  . Cancer Mother     breast  . Cancer Father     leukemia  . Cirrhosis Brother     deceased     History   Social History  . Marital Status: Widowed    Spouse Name: N/A    Number of Children: 2  . Years of Education: N/A   Occupational History  . 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 Smoking status: Former Smoker    Types: Cigarettes    Quit date: 02/04/1962  . Smokeless tobacco: Never Used  . Alcohol Use: Yes     Comment: rarely  . Drug Use: No  . Sexual Activity: Not on file   Other Topics Concern  . Not on file   Social History Narrative  . No narrative on file     BP 112/72  Pulse 61  Ht 5\' 4"  (1.626 m)  Wt 131 lb (59.421 kg)  BMI 22.47 kg/m2  SpO2 97%  Physical Exam:  Well appearing 80 year old woman, NAD HEENT: Unremarkable Neck:  No JVD,  no thyromegally Lungs:  Clear with no wheezes, rales, or rhonchi. HEART:  IRegular rate rhythm, no murmurs, no rubs, no clicks Abd:  soft, positive bowel sounds, no organomegally, no rebound, no guarding Ext:  2 plus pulses, no edema, no cyanosis, no clubbing Skin:  No rashes no nodules Neuro:  CN II through XII intact, motor grossly intact  DEVICE  Normal device function.  See PaceArt for details.   Assess/Plan:

## 2013-12-15 NOTE — Discharge Instructions (Signed)
Pacemaker Battery Change, Care After °Refer to this sheet in the next few weeks. These instructions provide you with information on caring for yourself after your procedure. Your health care provider Reinwald also give you more specific instructions. Your treatment has been planned according to current medical practices, but problems sometimes occur. Call your health care provider if you have any problems or questions after your procedure. °WHAT TO EXPECT AFTER THE PROCEDURE °After your procedure, it is typical to have the following sensations: °· Soreness at the pacemaker site. °HOME CARE INSTRUCTIONS  °· Keep the incision clean and dry. °· Unless advised otherwise, you Kulkarni shower beginning 48 hours after your procedure. °· For the first week after the replacement, avoid stretching motions that pull at the incision site, and avoid heavy exercise with the arm that is on the same side as the incision. °· Take medicines only as directed by your health care provider. °· Keep all follow-up visits as directed by your health care provider. °SEEK MEDICAL CARE IF:  °· You have pain at the incision site that is not relieved by over-the-counter or prescription medicine. °· There is drainage or pus from the incision site. °· There is swelling larger than a lime at the incision site. °· You develop red streaking that extends above or below the incision site. °· You feel brief, intermittent palpitations, light-headedness, or any symptoms that you feel might be related to your heart. °SEEK IMMEDIATE MEDICAL CARE IF:  °· You experience chest pain that is different than the pain at the pacemaker site. °· You experience shortness of breath. °· You have palpitations or irregular heartbeat. °· You have light-headedness that does not go away quickly. °· You faint. °· You have pain that gets worse and is not relieved by medicine. °Document Released: 01/08/2013 Document Revised: 08/04/2013 Document Reviewed: 01/08/2013 °[employer]® Patient  Information ©2015 [employer]. This information is not intended to replace advice given to you by your health care provider. Make sure you discuss any questions you have with your health care provider. ° °

## 2013-12-17 ENCOUNTER — Telehealth: Payer: Self-pay | Admitting: Internal Medicine

## 2013-12-17 MED ORDER — FLUTICASONE-SALMETEROL 250-50 MCG/DOSE IN AEPB: 1.0000 | INHALATION_SPRAY | Freq: Two times a day (BID) | RESPIRATORY_TRACT | Status: DC

## 2013-12-17 NOTE — Telephone Encounter (Signed)
Patient out of Advair 250/50. We're going to eat prescribed to [facility] mail order pharmacy. We sent a fax prescription last week but she has not received medication

## 2013-12-22 ENCOUNTER — Telehealth: Payer: Self-pay | Admitting: Internal Medicine

## 2013-12-22 NOTE — Telephone Encounter (Signed)
Pt called and has some concerns re: medication she received and cannot return. [facility] and  [facility] [employer].   Best number to call pt is [phone]

## 2013-12-22 NOTE — Telephone Encounter (Signed)
We have dealt with this now for over 2 weeks. [name] will call her.

## 2013-12-22 NOTE — Telephone Encounter (Signed)
Advised patient to contact [facility] to have them return rx.

## 2013-12-25 ENCOUNTER — Ambulatory Visit (INDEPENDENT_AMBULATORY_CARE_PROVIDER_SITE_OTHER): Payer: Medicare Other | Admitting: *Deleted

## 2013-12-25 DIAGNOSIS — I498 Other specified cardiac arrhythmias: Secondary | ICD-10-CM | POA: Diagnosis not present

## 2013-12-25 DIAGNOSIS — Z950 Presence of cardiac pacemaker: Secondary | ICD-10-CM

## 2013-12-25 LAB — MDC_IDC_ENUM_SESS_TYPE_INCLINIC
Battery Voltage: 2.8 V
Brady Statistic AP VP Percent: 0 %
Brady Statistic AP VS Percent: 0 %
Brady Statistic AS VS Percent: 100 %
Date Time Interrogation Session: 20150924125117
Lead Channel Impedance Value: 458 Ohm
Lead Channel Impedance Value: 619 Ohm
Lead Channel Pacing Threshold Amplitude: 1.25 V
Lead Channel Pacing Threshold Pulse Width: 0.4 ms
Lead Channel Sensing Intrinsic Amplitude: 0.7 mV
Lead Channel Sensing Intrinsic Amplitude: 8 mV
Lead Channel Setting Pacing Amplitude: 2 V
Lead Channel Setting Pacing Pulse Width: 0.4 ms
MDC IDC MSMT BATTERY IMPEDANCE: 100 Ohm
MDC IDC MSMT BATTERY REMAINING LONGEVITY: 138 mo
MDC IDC SET LEADCHNL RV PACING AMPLITUDE: 2.5 V
MDC IDC SET LEADCHNL RV SENSING SENSITIVITY: 4 mV
MDC IDC STAT BRADY AS VP PERCENT: 0 %

## 2013-12-25 NOTE — Progress Notes (Signed)
Wound check appointment for changeout. Steri-strips removed. Removed one suture. Wound without redness or edema. Incision edges approximated, wound well healed. Normal device function. Thresholds, sensing, and impedances consistent with implant measurements. Device programmed at chronic lead settings. Histogram distribution appropriate for patient and level of activity. 100% mode switch +eliquis. No high ventricular rates noted. Patient educated about wound care, arm mobility, lifting restrictions. ROV w/ Dr. Lovena Le 04/01/14.

## 2014-01-05 DIAGNOSIS — H3531 Nonexudative age-related macular degeneration: Secondary | ICD-10-CM | POA: Diagnosis not present

## 2014-01-08 ENCOUNTER — Encounter: Payer: Self-pay | Admitting: Internal Medicine

## 2014-02-06 DIAGNOSIS — H3531 Nonexudative age-related macular degeneration: Secondary | ICD-10-CM | POA: Diagnosis not present

## 2014-03-06 ENCOUNTER — Other Ambulatory Visit: Payer: Self-pay | Admitting: Internal Medicine

## 2014-03-09 ENCOUNTER — Telehealth: Payer: Self-pay

## 2014-03-09 NOTE — Telephone Encounter (Signed)
Patient says she received her flu vaccine from [facility].  Date unsure.

## 2014-03-12 ENCOUNTER — Encounter (HOSPITAL_COMMUNITY): Payer: Self-pay | Admitting: Internal Medicine

## 2014-03-13 ENCOUNTER — Encounter: Payer: Self-pay | Admitting: Internal Medicine

## 2014-03-13 ENCOUNTER — Ambulatory Visit (INDEPENDENT_AMBULATORY_CARE_PROVIDER_SITE_OTHER): Payer: Medicare Other | Admitting: Internal Medicine

## 2014-03-13 ENCOUNTER — Other Ambulatory Visit: Payer: Self-pay | Admitting: Internal Medicine

## 2014-03-13 VITALS — BP 122/80 | HR 70 | Temp 98.1°F | Wt 126.0 lb

## 2014-03-13 DIAGNOSIS — E039 Hypothyroidism, unspecified: Secondary | ICD-10-CM | POA: Diagnosis not present

## 2014-03-13 DIAGNOSIS — R634 Abnormal weight loss: Secondary | ICD-10-CM | POA: Diagnosis not present

## 2014-03-13 LAB — CBC WITH DIFFERENTIAL/PLATELET
Basophils Absolute: 0.1 10*3/uL (ref 0.0–0.1)
Basophils Relative: 1 % (ref 0–1)
EOS ABS: 0.5 10*3/uL (ref 0.0–0.7)
Eosinophils Relative: 7 % — ABNORMAL HIGH (ref 0–5)
HCT: 36.6 % (ref 36.0–46.0)
Hemoglobin: 12.2 g/dL (ref 12.0–15.0)
Lymphocytes Relative: 21 % (ref 12–46)
Lymphs Abs: 1.4 10*3/uL (ref 0.7–4.0)
MCH: 32.3 pg (ref 26.0–34.0)
MCHC: 33.3 g/dL (ref 30.0–36.0)
MCV: 96.8 fL (ref 78.0–100.0)
MONOS PCT: 10 % (ref 3–12)
MPV: 9.9 fL (ref 9.4–12.4)
Monocytes Absolute: 0.7 10*3/uL (ref 0.1–1.0)
Neutro Abs: 4.1 10*3/uL (ref 1.7–7.7)
Neutrophils Relative %: 61 % (ref 43–77)
Platelets: 228 10*3/uL (ref 150–400)
RBC: 3.78 MIL/uL — ABNORMAL LOW (ref 3.87–5.11)
RDW: 13.8 % (ref 11.5–15.5)
WBC: 6.8 10*3/uL (ref 4.0–10.5)

## 2014-03-13 LAB — LIPID PANEL
CHOLESTEROL: 144 mg/dL (ref 0–200)
HDL: 49 mg/dL (ref >39)
LDL Cholesterol: 74 mg/dL (ref 0–99)
TRIGLYCERIDES: 106 mg/dL (ref <150)
Total CHOL/HDL Ratio: 2.9 Ratio
VLDL: 21 mg/dL (ref 0–40)

## 2014-03-13 LAB — TSH: TSH: 1.591 u[IU]/mL (ref 0.350–4.500)

## 2014-03-13 LAB — COMPREHENSIVE METABOLIC PANEL
ALT: 11 U/L (ref 0–35)
AST: 13 U/L (ref 0–37)
Albumin: 3.9 g/dL (ref 3.5–5.2)
Alkaline Phosphatase: 75 U/L (ref 39–117)
BILIRUBIN TOTAL: 0.8 mg/dL (ref 0.2–1.2)
BUN: 21 mg/dL (ref 6–23)
CALCIUM: 9.4 mg/dL (ref 8.4–10.5)
CHLORIDE: 101 meq/L (ref 96–112)
CO2: 24 meq/L (ref 19–32)
Creat: 1.58 mg/dL — ABNORMAL HIGH (ref 0.50–1.10)
GLUCOSE: 95 mg/dL (ref 70–99)
Potassium: 4.2 mEq/L (ref 3.5–5.3)
SODIUM: 137 meq/L (ref 135–145)
TOTAL PROTEIN: 6.1 g/dL (ref 6.0–8.3)

## 2014-03-13 NOTE — Progress Notes (Addendum)
   Subjective:    Patient ID: [name], female    DOB: 13-Apr-1921, 80 y.o.   MRN: [id number]  HPI  Had pacemaker generator change in September. Doing well without complaints. Feels better some days than others. Did a lot of housework yesterday. Has had flu vaccine. Says appetite not very good. In June, she weighed 135 1/2 pounds. Now weighs 126 pounds. Says has been told to watch salt consumption. Says sometimes she is afraid to eat for that reason. Does like breakfast. It is her favorite meal. Eats breakfast every day. Sometimes doesn't eat much for the rest of the day. Told her to try to eat 3 meals daily. Sometimes skips lunch. Admitted to hospital in March with congestive heart failure. At that time, weighed 148 1/2 pounds and clearly had fluid overload.    Review of Systems     Objective:   Physical Exam  Neck supple without thyromegaly. Chest clear. Cor: irreg.  Ext without edema.      Assessment & Plan:  Weight loss-a bit concerning. She resides alone. Not sure how much she is eating. Needs more calories ,I think.  Hypothyroidism  Atrial fibrillation  Chronic anticoagulation  Pacemaker with recent generator change  Plan: Patient is to return in 2 months for weight check. Lab work drawn today including TSH.  Reinforced dietary instructions. I think her memory is not as good as it used to be. Spoke with daughter x 10 minutes by phone. Daughter confirms the patient is trying to follow a very strict low-sodium diet. Sometimes doesn't eat anything after 3 or 4 PM. Spoke with her about adding Ensure to diet. Says mother has cut out a lot of snacks that contained sodium that she would have previously and doesn't eat TV dinners anymore. Consider dietary consultation. Check pre-albumin.  25 minutes spent with patient.  Daughter is Darylene Price. Work # is [id number], home # is [id number], cell # [id number] .

## 2014-03-13 NOTE — Patient Instructions (Signed)
Please try to eat 3 meals daily and get in enough calories. Return in 8 weeks for weight check and office visit. Lab work is pending.

## 2014-03-16 ENCOUNTER — Telehealth: Payer: Self-pay

## 2014-03-16 LAB — PREALBUMIN: Prealbumin: 22.7 mg/dL (ref 17.0–34.0)

## 2014-03-16 NOTE — Telephone Encounter (Signed)
Call to [facility] to add prealbumin to patients recent lab work.

## 2014-04-01 ENCOUNTER — Encounter: Payer: Medicare Other | Admitting: Internal Medicine

## 2014-04-14 ENCOUNTER — Ambulatory Visit (INDEPENDENT_AMBULATORY_CARE_PROVIDER_SITE_OTHER): Payer: Medicare Other | Admitting: Internal Medicine

## 2014-04-14 ENCOUNTER — Encounter: Payer: Self-pay | Admitting: Internal Medicine

## 2014-04-14 VITALS — BP 118/64 | HR 86 | Ht 64.0 in | Wt 127.6 lb

## 2014-04-14 DIAGNOSIS — I5031 Acute diastolic (congestive) heart failure: Secondary | ICD-10-CM | POA: Diagnosis not present

## 2014-04-14 DIAGNOSIS — I10 Essential (primary) hypertension: Secondary | ICD-10-CM

## 2014-04-14 DIAGNOSIS — I495 Sick sinus syndrome: Secondary | ICD-10-CM

## 2014-04-14 DIAGNOSIS — Z950 Presence of cardiac pacemaker: Secondary | ICD-10-CM | POA: Diagnosis not present

## 2014-04-14 LAB — MDC_IDC_ENUM_SESS_TYPE_INCLINIC
Battery Remaining Longevity: 134 mo
Battery Voltage: 2.8 V
Brady Statistic AP VS Percent: 0 %
Brady Statistic AS VP Percent: 0 %
Lead Channel Impedance Value: 493 Ohm
Lead Channel Impedance Value: 641 Ohm
Lead Channel Pacing Threshold Amplitude: 1 V
Lead Channel Sensing Intrinsic Amplitude: 11.2 mV
Lead Channel Setting Pacing Amplitude: 2 V
Lead Channel Setting Sensing Sensitivity: 4 mV
MDC IDC MSMT BATTERY IMPEDANCE: 100 Ohm
MDC IDC MSMT LEADCHNL RA SENSING INTR AMPL: 0.7 mV
MDC IDC MSMT LEADCHNL RV PACING THRESHOLD PULSEWIDTH: 0.4 ms
MDC IDC SESS DTM: 20160112095953
MDC IDC SET LEADCHNL RV PACING AMPLITUDE: 2.5 V
MDC IDC SET LEADCHNL RV PACING PULSEWIDTH: 0.4 ms
MDC IDC STAT BRADY AP VP PERCENT: 1 %
MDC IDC STAT BRADY AS VS PERCENT: 98 %

## 2014-04-14 NOTE — Assessment & Plan Note (Signed)
Her blood pressure is well controlled. She is encouraged to maintain a low sodium diet.

## 2014-04-14 NOTE — Progress Notes (Signed)
HPI [name] returns today for followup. She is a very pleasant 81 year old woman with a history of symptomatic bradycardia, paroxysmal atrial fibrillation, status post permanent pacemaker insertion. In the interim, she has done well. She denies chest pain or sob. She now has chronic atrial fib and is asymptomatic. She denies syncope. She is only taking one dose of short acting beta blocker a day. No Known Allergies   Current Outpatient Prescriptions  Medication Sig Dispense Refill  . ALPRAZolam (XANAX) 0.5 MG tablet Take 0.5 mg by mouth daily.    Marland Kitchen apixaban (ELIQUIS) 2.5 MG TABS tablet Take 1 tablet (2.5 mg total) by mouth 2 (two) times daily. 180 tablet 2  . Cholecalciferol (VITAMIN D3) 1000 UNITS CAPS Take 1,000 Units by mouth daily.     . cycloSPORINE (RESTASIS) 0.05 % ophthalmic emulsion Place 1 drop into both eyes 2 (two) times daily.     Marland Kitchen diltiazem (CARDIZEM CD) 120 MG 24 hr capsule Take 120 mg by mouth daily.    Marland Kitchen esomeprazole (NEXIUM) 40 MG capsule Take 1 capsule (40 mg total) by mouth daily before breakfast. 90 capsule 3  . ferrous sulfate 325 (65 FE) MG tablet Take 325 mg by mouth daily with breakfast.      . furosemide (LASIX) 20 MG tablet Take 20 mg by mouth daily.    Marland Kitchen KLOR-CON M20 20 MEQ tablet Take 20 mEq by mouth daily.  6  . levothyroxine (SYNTHROID, LEVOTHROID) 88 MCG tablet TAKE 1 TABLET BY MOUTH EVERY DAY 90 tablet 3  . metoprolol (LOPRESSOR) 100 MG tablet Take 1 tablet (100 mg total) by mouth 2 (two) times daily. 60 tablet 5  . Multiple Vitamins-Minerals (ICAPS) CAPS Take 1 capsule by mouth 2 (two) times daily.     . psyllium (METAMUCIL) 58.6 % powder Take 1 packet by mouth every evening.      Current Facility-Administered Medications  Medication Dose Route Frequency Provider Last Rate Last Dose  . TDaP (BOOSTRIX) injection 0.5 mL  0.5 mL Intramuscular Once Elby Showers, MD         Past Medical History  Diagnosis Date  . Symptomatic sinus bradycardia     s/p  pacemaker  . Atrial flutter     s/p RFCA  . Atrial fibrillation     On warfarin  . Hypothyroidism   . GERD (gastroesophageal reflux disease)   . Esophageal stricture   . Anemia   . Asthma   . Tremor, essential   . Diverticulosis   . Vertigo   . Urinary, incontinence, stress female   . DJD (degenerative joint disease)     knees  . Renal insufficiency   . Hearing loss   . Macular degeneration   . HTN (hypertension)     a.  echo 3/06: EF 55-65%, mild LVH, mild RVH;   b.  Echo (06/10/13): EF 50-55%, normal wall motion, MAC, mild MR, mild RPE, PASP 40.    ROS:   All systems reviewed and negative except as noted in the HPI.   Past Surgical History  Procedure Laterality Date  . Pacemaker insertion    . Radiofrequency catheter ablation    . Transthoracic echocardiogram  06/10/2013    EF 50-55% Mild MR  . Permanent pacemaker generator change N/A 12/15/2013    Procedure: PERMANENT PACEMAKER GENERATOR CHANGE;  Surgeon: Evans Lance, MD;  Location: [facility] CATH LAB;  Service: Cardiovascular;  Laterality: N/A;     Family History  Problem Relation Age of Onset  .  Cancer Mother     breast  . Cancer Father     leukemia  . Cirrhosis Brother     deceased     History   Social History  . Marital Status: Widowed    Spouse Name: N/A    Number of Children: 2  . Years of Education: N/A   Occupational History  . 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 Smoking status: Former Smoker    Types: Cigarettes    Quit date: 02/04/1962  . Smokeless tobacco: Never Used  . Alcohol Use: Yes     Comment: rarely  . Drug Use: No  . Sexual Activity: Not on file   Other Topics Concern  . Not on file   Social History Narrative     BP 118/64 mmHg  Pulse 86  Ht 5\' 4"  (1.626 m)  Wt 127 lb 9.6 oz (57.879 kg)  BMI 21.89 kg/m2  Physical Exam:  Well appearing 81 year old woman, NAD HEENT: Unremarkable Neck:  6 cm JVD, no thyromegally Lungs:  Clear with no wheezes, rales,  or rhonchi. PPM is well healed. HEART:  IRegular rate rhythm, no murmurs, no rubs, no clicks Abd:  soft, positive bowel sounds, no organomegally, no rebound, no guarding Ext:  2 plus pulses, no edema, no cyanosis, no clubbing Skin:  No rashes no nodules Neuro:  CN II through XII intact, motor grossly intact  DEVICE  Normal device function.  See PaceArt for details.   Assess/Plan:

## 2014-04-14 NOTE — Patient Instructions (Signed)
Your physician wants you to follow-up in: 12 months with Dr. Taylor You will receive a reminder letter in the mail two months in advance. If you don't receive a letter, please call our office to schedule the follow-up appointment.  Remote monitoring is used to monitor your Pacemaker or ICD from home. This monitoring reduces the number of office visits required to check your device to one time per year. It allows us to keep an eye on the functioning of your device to ensure it is working properly. You are scheduled for a device check from home on 07/14/14. You Rijo send your transmission at any time that day. If you have a wireless device, the transmission will be sent automatically. After your physician reviews your transmission, you will receive a postcard with your next transmission date.    

## 2014-04-14 NOTE — Assessment & Plan Note (Signed)
Her symptoms are currently class 2A. She will continue her current meds and maintain a low sodium diet.

## 2014-04-14 NOTE — Assessment & Plan Note (Signed)
Her medtronic DDD PM is working normally. Will recheck in several months. 

## 2014-04-23 ENCOUNTER — Encounter: Payer: Self-pay | Admitting: Internal Medicine

## 2014-05-01 ENCOUNTER — Encounter: Payer: Medicare Other | Admitting: Internal Medicine

## 2014-05-14 ENCOUNTER — Ambulatory Visit (INDEPENDENT_AMBULATORY_CARE_PROVIDER_SITE_OTHER): Payer: Medicare Other | Admitting: Internal Medicine

## 2014-05-14 ENCOUNTER — Encounter: Payer: Self-pay | Admitting: Internal Medicine

## 2014-05-14 VITALS — BP 124/68 | HR 90 | Temp 98.1°F | Wt 129.0 lb

## 2014-05-14 DIAGNOSIS — Z8679 Personal history of other diseases of the circulatory system: Secondary | ICD-10-CM | POA: Diagnosis not present

## 2014-05-14 DIAGNOSIS — L219 Seborrheic dermatitis, unspecified: Secondary | ICD-10-CM

## 2014-05-14 DIAGNOSIS — Z87898 Personal history of other specified conditions: Secondary | ICD-10-CM

## 2014-05-14 DIAGNOSIS — E039 Hypothyroidism, unspecified: Secondary | ICD-10-CM | POA: Diagnosis not present

## 2014-05-14 DIAGNOSIS — H6123 Impacted cerumen, bilateral: Secondary | ICD-10-CM | POA: Diagnosis not present

## 2014-05-14 DIAGNOSIS — Z8669 Personal history of other diseases of the nervous system and sense organs: Secondary | ICD-10-CM

## 2014-05-14 DIAGNOSIS — Z950 Presence of cardiac pacemaker: Secondary | ICD-10-CM

## 2014-05-14 DIAGNOSIS — R634 Abnormal weight loss: Secondary | ICD-10-CM | POA: Diagnosis not present

## 2014-05-14 MED ORDER — NEOMYCIN-POLYMYXIN-HC 3.5-10000-1 OT SOLN: OTIC | Status: DC

## 2014-05-14 NOTE — Patient Instructions (Signed)
Is Cortisporin Otic suspension in ear as needed for itching. Return after June 5 for physical exam. Continue same medications. You have gained 3 pounds since last visit.

## 2014-05-14 NOTE — Progress Notes (Signed)
   Subjective:    Patient ID: [name], female    DOB: 03-22-1922, 81 y.o.   MRN: [id number]  HPI patient in today for follow-up of weight loss. At last visit weighed 126 pounds and I was concerned about her weight loss. At that time a contacted her daughter. She's here today for follow-up on weight loss. She now weighs 129 pounds. Pulse oximetry 97% and blood pressure stable at 124/68 with pulse of 90 which is regular. She has a history of pacemaker. Says she feels well the past several days. Her left ear itches some and she has a history of seborrhea left external ear canal. Have prescribed Cortisporin Otic suspension for that.  Had recent episode of vertigo. This seems to start when she tilts her head backwards at times. Otherwise feeling well. Lab work from December reviewed. Continues to live at home alone. Says she is eating much better.    Review of Systems     Objective:   Physical Exam Pulses regular. Skin warm and dry. Impacted cerumen both ears removed with curette;  Pharynx is clear. Neck is supple. No JVD. No thyromegaly. Chest clear. Extremities without edema. She is alert and oriented 3       Assessment & Plan:  History of vertigo  History of pacemaker  Hypothyroidism  Weight loss-improved from December  Impacted cerumen both ears  Plan: Return for physical exam after June 5. Cortisporin Otic suspension to use in left external ear canal up to 4 times a day if needed. Continue same medications.  25 minutes spent with patient

## 2014-06-03 ENCOUNTER — Ambulatory Visit (INDEPENDENT_AMBULATORY_CARE_PROVIDER_SITE_OTHER): Payer: Medicare Other | Admitting: *Deleted

## 2014-06-03 DIAGNOSIS — I4891 Unspecified atrial fibrillation: Secondary | ICD-10-CM | POA: Diagnosis not present

## 2014-06-03 DIAGNOSIS — Z7901 Long term (current) use of anticoagulants: Secondary | ICD-10-CM | POA: Diagnosis not present

## 2014-06-03 DIAGNOSIS — I4892 Unspecified atrial flutter: Secondary | ICD-10-CM

## 2014-06-03 LAB — CBC
HCT: 34.5 % — ABNORMAL LOW (ref 36.0–46.0)
Hemoglobin: 11.6 g/dL — ABNORMAL LOW (ref 12.0–15.0)
MCHC: 33.8 g/dL (ref 30.0–36.0)
MCV: 95.6 fl (ref 78.0–100.0)
PLATELETS: 240 10*3/uL (ref 150.0–400.0)
RBC: 3.61 Mil/uL — AB (ref 3.87–5.11)
RDW: 13.1 % (ref 11.5–15.5)
WBC: 9 10*3/uL (ref 4.0–10.5)

## 2014-06-03 LAB — BASIC METABOLIC PANEL
BUN: 25 mg/dL — ABNORMAL HIGH (ref 6–23)
CO2: 27 meq/L (ref 19–32)
Calcium: 9.3 mg/dL (ref 8.4–10.5)
Chloride: 102 mEq/L (ref 96–112)
Creatinine, Ser: 1.54 mg/dL — ABNORMAL HIGH (ref 0.40–1.20)
GFR: 33.62 mL/min — ABNORMAL LOW (ref >60.00)
GLUCOSE: 105 mg/dL — AB (ref 70–99)
POTASSIUM: 4.4 meq/L (ref 3.5–5.1)
SODIUM: 134 meq/L — AB (ref 135–145)

## 2014-06-03 NOTE — Progress Notes (Signed)
Pt was started on Eliquis 2.5mg  twice a day for AFIB on 11/06/13.    Reviewed patients medication list.  Pt is not currently on any combined P-gp and strong CYP3A4 inhibitors/inducers (ketoconazole, traconazole, ritonavir, carbamazepine, phenytoin, rifampin, St. John's wort).  Age 81 years old and Weight 58.6kg (120 lbs), Labs reviewed-SCr 1.54, Hgb 11.6 and HCT 34.5 Dose appropriate based on dosing criteria.     A full discussion of the nature of anticoagulants has been carried out.  A benefit/risk analysis has been presented to the patient, so that they understand the justification for choosing anticoagulation with Eliquis at this time.  The need for compliance is stressed.  Pt is aware to take the medication twice daily.  Side effects of potential bleeding are discussed, including unusual colored urine or stools, coughing up blood or coffee ground emesis, nose bleeds or serious fall or head trauma.  Discussed signs and symptoms of stroke. The patient should avoid any OTC items containing aspirin or ibuprofen.  Avoid alcohol consumption. Call if any signs of abnormal bleeding.  Discussed financial obligations and resolved any difficulty in obtaining medication.  Follow up in 6 months.   Called and spoke with the patient. Reviewed labs and instructed her to continue taking Eliquis 2.5mg  twice a day. She verbalized understanding and appointment set for 6 months.

## 2014-06-05 ENCOUNTER — Other Ambulatory Visit: Payer: Self-pay | Admitting: Internal Medicine

## 2014-06-05 ENCOUNTER — Other Ambulatory Visit: Payer: Self-pay

## 2014-06-05 MED ORDER — DILTIAZEM HCL ER COATED BEADS 120 MG PO CP24: 120.0000 mg | ORAL_CAPSULE | Freq: Every day | ORAL | Status: DC

## 2014-06-05 NOTE — Telephone Encounter (Signed)
Refill x 6 months 

## 2014-06-05 NOTE — Telephone Encounter (Signed)
Called in alprazolam to patient pharmacy

## 2014-06-07 ENCOUNTER — Other Ambulatory Visit: Payer: Self-pay | Admitting: Cardiology

## 2014-07-04 ENCOUNTER — Telehealth: Payer: Self-pay | Admitting: Physician Assistant

## 2014-07-04 NOTE — Telephone Encounter (Signed)
[facility] as patient received wrong cardizem, she was prescribed long acting 24 hr cardizem, however the pharmacy gave her 12 hr strength instead, this has been corrected and patient contacted by pharmacy to change the medication.  I have instructed pharmacy staff to let patient know to call cardiology if has any sign of dizziness related to hypotension.  Hilbert Corrigan PA Pager: [phone]

## 2014-07-07 ENCOUNTER — Other Ambulatory Visit: Payer: Self-pay | Admitting: Internal Medicine

## 2014-07-13 ENCOUNTER — Telehealth: Payer: Self-pay | Admitting: Internal Medicine

## 2014-07-14 ENCOUNTER — Telehealth: Payer: Self-pay | Admitting: Cardiology

## 2014-07-14 ENCOUNTER — Ambulatory Visit (INDEPENDENT_AMBULATORY_CARE_PROVIDER_SITE_OTHER): Payer: Medicare Other | Admitting: *Deleted

## 2014-07-14 DIAGNOSIS — L821 Other seborrheic keratosis: Secondary | ICD-10-CM | POA: Diagnosis not present

## 2014-07-14 DIAGNOSIS — I495 Sick sinus syndrome: Secondary | ICD-10-CM | POA: Diagnosis not present

## 2014-07-14 DIAGNOSIS — L570 Actinic keratosis: Secondary | ICD-10-CM | POA: Diagnosis not present

## 2014-07-14 DIAGNOSIS — Z85828 Personal history of other malignant neoplasm of skin: Secondary | ICD-10-CM | POA: Diagnosis not present

## 2014-07-14 NOTE — Telephone Encounter (Signed)
LMOVM reminding pt to send remote transmission.   

## 2014-07-14 NOTE — Progress Notes (Signed)
Remote pacemaker transmission.   

## 2014-07-19 LAB — MDC_IDC_ENUM_SESS_TYPE_REMOTE
Battery Remaining Longevity: 132 mo
Battery Voltage: 2.8 V
Brady Statistic AP VS Percent: 0 %
Brady Statistic AS VP Percent: 1 %
Brady Statistic AS VS Percent: 98 %
Date Time Interrogation Session: 20160412174959
Lead Channel Impedance Value: 448 Ohm
Lead Channel Impedance Value: 589 Ohm
Lead Channel Pacing Threshold Amplitude: 1 V
Lead Channel Pacing Threshold Pulse Width: 0.4 ms
Lead Channel Sensing Intrinsic Amplitude: 2 mV
Lead Channel Sensing Intrinsic Amplitude: 22.4 mV
Lead Channel Setting Pacing Amplitude: 2.5 V
Lead Channel Setting Sensing Sensitivity: 4 mV
MDC IDC MSMT BATTERY IMPEDANCE: 100 Ohm
MDC IDC SET LEADCHNL RA PACING AMPLITUDE: 2 V
MDC IDC SET LEADCHNL RV PACING PULSEWIDTH: 0.4 ms
MDC IDC STAT BRADY AP VP PERCENT: 1 %

## 2014-07-27 ENCOUNTER — Encounter: Payer: Self-pay | Admitting: Cardiology

## 2014-07-27 DIAGNOSIS — H524 Presbyopia: Secondary | ICD-10-CM | POA: Diagnosis not present

## 2014-07-27 DIAGNOSIS — H3531 Nonexudative age-related macular degeneration: Secondary | ICD-10-CM | POA: Diagnosis not present

## 2014-07-27 DIAGNOSIS — H04123 Dry eye syndrome of bilateral lacrimal glands: Secondary | ICD-10-CM | POA: Diagnosis not present

## 2014-07-30 ENCOUNTER — Encounter: Payer: Self-pay | Admitting: Internal Medicine

## 2014-08-12 NOTE — Progress Notes (Signed)
This encounter was created in error - please disregard.

## 2014-08-22 ENCOUNTER — Telehealth: Payer: Self-pay | Admitting: Cardiology

## 2014-08-24 ENCOUNTER — Other Ambulatory Visit: Payer: Self-pay

## 2014-08-24 MED ORDER — APIXABAN 2.5 MG PO TABS: 2.5000 mg | ORAL_TABLET | Freq: Two times a day (BID) | ORAL | Status: DC

## 2014-08-24 NOTE — Telephone Encounter (Signed)
°  1. Which medications need to be refilled? Eliquis 2.Which pharmacy is medication to be sent to?[facility]-please send today  3. Do they need a 30 day or 90 day supply? 90 and refills  4. Would they like a call back once the medication has been sent to the pharmacy? no

## 2014-08-25 NOTE — Telephone Encounter (Signed)
error 

## 2014-08-26 ENCOUNTER — Telehealth: Payer: Self-pay | Admitting: Internal Medicine

## 2014-08-26 ENCOUNTER — Encounter: Payer: Self-pay | Admitting: Internal Medicine

## 2014-08-26 ENCOUNTER — Ambulatory Visit (INDEPENDENT_AMBULATORY_CARE_PROVIDER_SITE_OTHER): Payer: Medicare Other | Admitting: Internal Medicine

## 2014-08-26 VITALS — BP 122/68 | HR 84 | Temp 97.4°F | Ht 64.0 in | Wt 128.0 lb

## 2014-08-26 DIAGNOSIS — R42 Dizziness and giddiness: Secondary | ICD-10-CM | POA: Diagnosis not present

## 2014-08-26 LAB — CBC WITH DIFFERENTIAL/PLATELET
BASOS ABS: 0.1 10*3/uL (ref 0.0–0.1)
Basophils Relative: 1 % (ref 0–1)
EOS ABS: 0.4 10*3/uL (ref 0.0–0.7)
Eosinophils Relative: 7 % — ABNORMAL HIGH (ref 0–5)
HCT: 36.1 % (ref 36.0–46.0)
Hemoglobin: 12.2 g/dL (ref 12.0–15.0)
Lymphocytes Relative: 24 % (ref 12–46)
Lymphs Abs: 1.5 10*3/uL (ref 0.7–4.0)
MCH: 32.1 pg (ref 26.0–34.0)
MCHC: 33.8 g/dL (ref 30.0–36.0)
MCV: 95 fL (ref 78.0–100.0)
MONO ABS: 0.6 10*3/uL (ref 0.1–1.0)
MONOS PCT: 10 % (ref 3–12)
MPV: 9.9 fL (ref 8.6–12.4)
NEUTROS PCT: 58 % (ref 43–77)
Neutro Abs: 3.7 10*3/uL (ref 1.7–7.7)
Platelets: 227 10*3/uL (ref 150–400)
RBC: 3.8 MIL/uL — ABNORMAL LOW (ref 3.87–5.11)
RDW: 14.1 % (ref 11.5–15.5)
WBC: 6.3 10*3/uL (ref 4.0–10.5)

## 2014-08-26 MED ORDER — PROMETHAZINE HCL 25 MG/ML IJ SOLN
25.0000 mg | Freq: Once | INTRAMUSCULAR | Status: AC
  Administered 2014-08-26: 25 mg via INTRAMUSCULAR

## 2014-08-26 NOTE — Telephone Encounter (Signed)
Appointment made with Dr. Erik Obey @ [facility] @ [address] on Wednesday 6/8 @ 4:10 p.m.  Appointment card and info given to patient.  Patient will be out of town leaving 5/28 and return 6/4.  Reason for visit:  Bilateral impacted cerumen.  Previous to THIS visit, patient was last seen by Dr Erik Obey in 0240.

## 2014-08-26 NOTE — Patient Instructions (Signed)
Given Phenergan 25 mg IM today in the office. Continue Xanax as needed for vertigo. Appointment June 8 with ENT regarding cerumen removal.

## 2014-08-26 NOTE — Progress Notes (Signed)
   Subjective:    Patient ID: [name], female    DOB: May 11, 1921, 81 y.o.   MRN: [id number]  HPI Patient is now 81 years old. She has a long-standing history of benign positional vertigo. Last week she had recurrence of vertigo. At one point she felt unsteady. Was able to grab a bedpost and make her way to a couch without falling. She wants to go [place] this weekend for [facility] [employer] [place] with her daughter. Daughter told her she needed to be evaluated first. She has no chest pain. No shortness of breath. She has no orthostasis today on exam. Vital signs are stable. She is afebrile. Complaining of fullness in her ears. Has seen ENT physician in the past to have cerumen removed. She is on chronic anticoagulation therapy. No sore throat or URI symptoms. At times seems to have had bouts of vertigo related to anxiety. Has Xanax to take up to 3 times daily for anxiety and vertigo. Says she has been taking it.    Review of Systems     Objective:   Physical Exam  Skin is pale warm and dry. Nodes none. No JVD thyromegaly or significant bruits. Chest clear to auscultation. Cardiac exam today unremarkable. Extremities without edema. No nystagmus. She is alert and oriented 3. PERRLA. TMs have cerumen bilaterally. No focal deficits. Moves all 4 extremities. Gait is fairly steady with a cane.      Assessment & Plan:  Benign positional vertigo  Cerumen both ears  Plan: We cannot get an ENT appointment until June 8 for cerumen removal. Was given Phenergan 25 mg IM today to help with vertigo. Continue Xanax as needed. CBC is within normal limits. No evidence of anemia or elevated white blood cell count.  25 minutes spent with patient

## 2014-08-27 ENCOUNTER — Telehealth: Payer: Self-pay | Admitting: *Deleted

## 2014-08-27 NOTE — Telephone Encounter (Signed)
Reviewed lab results with patient.

## 2014-09-09 DIAGNOSIS — R42 Dizziness and giddiness: Secondary | ICD-10-CM | POA: Diagnosis not present

## 2014-09-09 DIAGNOSIS — H9193 Unspecified hearing loss, bilateral: Secondary | ICD-10-CM | POA: Diagnosis not present

## 2014-09-09 DIAGNOSIS — H6123 Impacted cerumen, bilateral: Secondary | ICD-10-CM | POA: Diagnosis not present

## 2014-09-11 ENCOUNTER — Encounter: Payer: Medicare Other | Admitting: Internal Medicine

## 2014-09-14 ENCOUNTER — Ambulatory Visit (INDEPENDENT_AMBULATORY_CARE_PROVIDER_SITE_OTHER): Payer: Medicare Other | Admitting: Internal Medicine

## 2014-09-14 ENCOUNTER — Encounter: Payer: Self-pay | Admitting: Internal Medicine

## 2014-09-14 VITALS — BP 120/64 | HR 82 | Temp 97.8°F | Ht 64.0 in | Wt 125.0 lb

## 2014-09-14 DIAGNOSIS — N183 Chronic kidney disease, stage 3 unspecified: Secondary | ICD-10-CM

## 2014-09-14 DIAGNOSIS — Z0000 Encounter for general adult medical examination without abnormal findings: Secondary | ICD-10-CM

## 2014-09-14 DIAGNOSIS — H3530 Unspecified macular degeneration: Secondary | ICD-10-CM | POA: Diagnosis not present

## 2014-09-14 DIAGNOSIS — Z13220 Encounter for screening for lipoid disorders: Secondary | ICD-10-CM | POA: Diagnosis not present

## 2014-09-14 DIAGNOSIS — E039 Hypothyroidism, unspecified: Secondary | ICD-10-CM | POA: Diagnosis not present

## 2014-09-14 DIAGNOSIS — Z23 Encounter for immunization: Secondary | ICD-10-CM

## 2014-09-14 DIAGNOSIS — F411 Generalized anxiety disorder: Secondary | ICD-10-CM

## 2014-09-14 DIAGNOSIS — R5383 Other fatigue: Secondary | ICD-10-CM

## 2014-09-14 DIAGNOSIS — I10 Essential (primary) hypertension: Secondary | ICD-10-CM

## 2014-09-14 DIAGNOSIS — E559 Vitamin D deficiency, unspecified: Secondary | ICD-10-CM | POA: Diagnosis not present

## 2014-09-14 DIAGNOSIS — R634 Abnormal weight loss: Secondary | ICD-10-CM | POA: Diagnosis not present

## 2014-09-14 DIAGNOSIS — I480 Paroxysmal atrial fibrillation: Secondary | ICD-10-CM

## 2014-09-14 DIAGNOSIS — I5031 Acute diastolic (congestive) heart failure: Secondary | ICD-10-CM | POA: Diagnosis not present

## 2014-09-14 DIAGNOSIS — Z79899 Other long term (current) drug therapy: Secondary | ICD-10-CM | POA: Diagnosis not present

## 2014-09-14 DIAGNOSIS — Z136 Encounter for screening for cardiovascular disorders: Secondary | ICD-10-CM | POA: Diagnosis not present

## 2014-09-14 DIAGNOSIS — Z950 Presence of cardiac pacemaker: Secondary | ICD-10-CM | POA: Diagnosis not present

## 2014-09-14 DIAGNOSIS — Z7901 Long term (current) use of anticoagulants: Secondary | ICD-10-CM | POA: Diagnosis not present

## 2014-09-14 LAB — CBC WITH DIFFERENTIAL/PLATELET
Basophils Absolute: 0.1 10*3/uL (ref 0.0–0.1)
Basophils Relative: 1 % (ref 0–1)
EOS ABS: 0.6 10*3/uL (ref 0.0–0.7)
Eosinophils Relative: 8 % — ABNORMAL HIGH (ref 0–5)
HEMATOCRIT: 37.2 % (ref 36.0–46.0)
HEMOGLOBIN: 12 g/dL (ref 12.0–15.0)
LYMPHS ABS: 1.9 10*3/uL (ref 0.7–4.0)
Lymphocytes Relative: 27 % (ref 12–46)
MCH: 32.2 pg (ref 26.0–34.0)
MCHC: 32.3 g/dL (ref 30.0–36.0)
MCV: 99.7 fL (ref 78.0–100.0)
MONOS PCT: 13 % — AB (ref 3–12)
MPV: 10.4 fL (ref 8.6–12.4)
Monocytes Absolute: 0.9 10*3/uL (ref 0.1–1.0)
Neutro Abs: 3.6 10*3/uL (ref 1.7–7.7)
Neutrophils Relative %: 51 % (ref 43–77)
Platelets: 232 10*3/uL (ref 150–400)
RBC: 3.73 MIL/uL — AB (ref 3.87–5.11)
RDW: 13.9 % (ref 11.5–15.5)
WBC: 7.1 10*3/uL (ref 4.0–10.5)

## 2014-09-14 LAB — POCT URINALYSIS DIPSTICK
Bilirubin, UA: NEGATIVE
Glucose, UA: NEGATIVE
KETONES UA: NEGATIVE
Leukocytes, UA: NEGATIVE
Nitrite, UA: NEGATIVE
PH UA: 6
PROTEIN UA: NEGATIVE
RBC UA: NEGATIVE
Spec Grav, UA: 1.01
Urobilinogen, UA: NEGATIVE

## 2014-09-14 LAB — COMPLETE METABOLIC PANEL WITH GFR
ALBUMIN: 3.8 g/dL (ref 3.5–5.2)
ALT: 11 U/L (ref 0–35)
AST: 13 U/L (ref 0–37)
Alkaline Phosphatase: 67 U/L (ref 39–117)
BUN: 25 mg/dL — AB (ref 6–23)
CALCIUM: 9.8 mg/dL (ref 8.4–10.5)
CO2: 26 mEq/L (ref 19–32)
CREATININE: 1.63 mg/dL — AB (ref 0.50–1.10)
Chloride: 104 mEq/L (ref 96–112)
GFR, EST NON AFRICAN AMERICAN: 28 mL/min — AB
GFR, Est African American: 32 mL/min — ABNORMAL LOW
GLUCOSE: 69 mg/dL — AB (ref 70–99)
POTASSIUM: 4.5 meq/L (ref 3.5–5.3)
Sodium: 140 mEq/L (ref 135–145)
Total Bilirubin: 0.6 mg/dL (ref 0.2–1.2)
Total Protein: 6.2 g/dL (ref 6.0–8.3)

## 2014-09-14 LAB — TSH: TSH: 1.946 u[IU]/mL (ref 0.350–4.500)

## 2014-09-14 LAB — IRON AND TIBC
%SAT: 37 % (ref 20–55)
IRON: 93 ug/dL (ref 42–145)
TIBC: 253 ug/dL (ref 250–470)
UIBC: 160 ug/dL (ref 125–400)

## 2014-09-14 LAB — FOLATE: FOLATE: 9.7 ng/mL

## 2014-09-14 LAB — VITAMIN B12: Vitamin B-12: 512 pg/mL (ref 211–911)

## 2014-09-14 NOTE — Addendum Note (Signed)
Addended by: Leota Jacobsen on: 09/14/2014 11:27 AM   Modules accepted: Orders

## 2014-09-14 NOTE — Patient Instructions (Signed)
Prevnar given today. Continue same meds and return in 6 months

## 2014-09-14 NOTE — Progress Notes (Signed)
Subjective:    Patient ID: [name], female    DOB: May 04, 1921, 81 y.o.   MRN: [id number]  HPI 81 year old White Female in today for health maintenance exam and evaluation of medical issues. She has a long-standing history of benign positional vertigo which she was seen here for recently and subsequently has improved but it took a bit of time. She had cerumen removed from her ears recently by ENT. Continues to reside alone. Daughter checks infrequently. Doesn't cook very much anymore. She stopped taking Nexium and says she does not feel that she has reflux symptoms anymore. She is on [place]-Quin spore history of atrial fibrillation. She has a pacemaker. History of acute diastolic heart failure in 2015 precipitated by atrial fibrillation with rapid ventricular response. History of hypertension. Pacemaker was placed for sick sinus syndrome in 2005. Had esophageal stricture dilated in 1996. Long-standing history of GE reflux but currently not on PPI. History of stress urinary incontinence, osteoarthritis, chronic kidney disease, hearing loss in both ears, anemia. History of anxiety. History of shots [place] ring 1992. Hypothyroidism. Essential tremor that started in the early 1990s.  Colonoscopy in 2001 and will not be repeated due to age. Cataract extraction of the right eye in 1992 in the left eye in 1993. History of right middle lobe pneumonia in 1996.  Family history: Father died at age 1 due to acute leukemia. Mother died at age 51 of kidney failure. One brother died at age 71 of cirrhosis of the liver. One sister.  Social history: She is a widow. She is a retired Chief Technology Officer. She has maintained good physical conditioning. Form was smoked a pack of cigarettes daily for some 5 years but quit in the 1960s. Son resides in [place]. Has a daughter who resides in [place].  Weight has been an issue. Weight in June 2015 was 135 1/2 pounds. Weight is now 125 pounds. Weight loss seems to be  due to decreased appetite. Have spoken with daughter about this previously. Weight in [place] was 128 pounds. No GI complaints.    Review of Systems  Constitutional: Positive for fatigue.  Respiratory: Negative.   Cardiovascular: Negative.   Neurological:       Essential tremor  Psychiatric/Behavioral:       Anxiety       Objective:   Physical Exam  Constitutional: She is oriented to person, place, and time. She appears well-developed. No distress.  Frail white female ambulates with cane  HENT:  Head: Normocephalic and atraumatic.  Right Ear: External ear normal.  Left Ear: External ear normal.  Mouth/Throat: Oropharynx is clear and moist. No oropharyngeal exudate.  Eyes: Conjunctivae and EOM are normal. Pupils are equal, round, and reactive to light. Right eye exhibits no discharge. Left eye exhibits no discharge. No scleral icterus.  Neck: Neck supple. No JVD present. No thyromegaly present.  Cardiovascular: Normal rate, regular rhythm, normal heart sounds and intact distal pulses.   No murmur heard. Pulmonary/Chest: Effort normal and breath sounds normal. She has no wheezes. She has no rales.  Breasts normal female  Abdominal: Soft. Bowel sounds are normal. She exhibits no distension and no mass. There is no tenderness. There is no rebound and no guarding.  Genitourinary:  Deferred due to age  Musculoskeletal: She exhibits no edema.  Lymphadenopathy:    She has no cervical adenopathy.  Neurological: She is alert and oriented to person, place, and time. She has normal reflexes.  Essential tremor  Skin: Skin is warm  and dry. She is not diaphoretic.  Psychiatric: She has a normal mood and affect. Her behavior is normal. Judgment and thought content normal.  Vitals reviewed.         Assessment & Plan:  Atrial fibrillation with rapid ventricular response  Acute diastolic heart failure  Chronic anticoagulation  Essential hypertension  Cardiac pacemaker  Macular  degeneration  Benign positional vertigo  Anxiety  Weight loss-temp and weight loss in the past year. Continue to monitor. Recheck in 6 months. She is not fasting today we'll do lipid panel at next visit along with TSH. Labs drawn today and pending include TSH. Prealbumin normal December 2015. We'll recheck along with B-12 iron and iron-binding capacity and folate levels. Has lost 3 pounds since [place] but had company and says she is tired.  Chronic kidney disease continue to monitor  She is alert and oriented 3. Seems to be safe at home. Has a Life Alert device around her neck. Need to monitor weight loss. Speak with daughter again about this issue.  Subjective:   Patient presents for Medicare Annual/Subsequent preventive examination.  Review Past Medical/Family/Social: See above   Risk Factors  Current exercise habits: Mostly sedentary ambulates with cane Dietary issues discussed: Do think she probably is not getting enough calories or protein  Cardiac risk factors: History of sick sinus syndrome and paroxysmal atrial fibrillation on chronic anticoagulation  Depression Screen  (Note: if answer to either of the following is "Yes", a more complete depression screening is indicated)   Over the past two weeks, have you felt down, depressed or hopeless? No  Over the past two weeks, have you felt little interest or pleasure in doing things? No Have you lost interest or pleasure in daily life? No Do you often feel hopeless? No Do you cry easily over simple problems? No   Activities of Daily Living  In your present state of health, do you have any difficulty performing the following activities?:   Driving? No longer drives. History of macular degeneration Managing money? No  Feeding yourself? No  Getting from bed to chair? No  Climbing a flight of stairs? Yes short of breath Preparing food and eating?: No  Bathing or showering? No  Getting dressed: No  Getting to the toilet? No    Using the toilet:No  Moving around from place to place: No  In the past year have you fallen or had a near fall?:yes Are you sexually active? No  Do you have more than one partner? No   Hearing Difficulties: No  Do you often ask people to speak up or repeat themselves? No  Do you experience ringing or noises in your ears? Yes Do you have difficulty understanding soft or whispered voices? Yes Do you feel that you have a problem with memory? Sometimes Do you often misplace items? No    Home Safety:  Do you have a smoke alarm at your residence? Yes Do you have grab bars in the bathroom? Yes Do you have throw rugs in your house? No  Cognitive Testing  Alert? Yes Normal Appearance?Yes  Oriented to person? Yes Place? Yes  Time? Yes  Recall of three objects? Yes  Can perform simple calculations? Yes  Displays appropriate judgment?Yes  Can read the correct time from a watch face?Yes   List the Names of Other Physician/Practitioners you currently use:  See referral list for the physicians patient is currently seeing.  Cardiologist  ENT physician   Review of Systems: See above  Objective:     General appearance: Appears stated age. Frail. Ambulates with cane Head: Normocephalic, without obvious abnormality, atraumatic  Eyes: conj clear, EOMi PEERLA  Ears: normal TM's and external ear canals both ears  Nose: Nares normal. Septum midline. Mucosa normal. No drainage or sinus tenderness.  Throat: lips, mucosa, and tongue normal; teeth and gums normal  Neck: no adenopathy, no carotid bruit, no JVD, supple, symmetrical, trachea midline and thyroid not enlarged, symmetric, no tenderness/mass/nodules  No CVA tenderness.  Lungs: clear to auscultation bilaterally  Breasts: normal appearance, no masses or tenderness, top of the pacemaker on left upper chest.  Heart: regular rate and rhythm, S1, S2 normal, no murmur, click, rub or gallop  Abdomen: soft, non-tender; bowel sounds  normal; no masses, no organomegaly  Musculoskeletal: ROM normal in all joints, no crepitus, no deformity, Normal muscle strengthen. Back  is symmetric, no curvature. Skin: Skin color, texture, turgor normal. No rashes or lesions  Lymph nodes: Cervical, supraclavicular, and axillary nodes normal.  Neurologic: CN 2 -12 Normal, Normal symmetric reflexes. Normal coordination and gait  Psych: Alert & Oriented x 3, Mood appear stable.    Assessment:    Annual wellness medicare exam   Plan:    During the course of the visit the patient was educated and counseled about appropriate screening and preventive services including:   Prevnar given     Patient Instructions (the written plan) was given to the patient.  Medicare Attestation  I have personally reviewed:  The patient's medical and social history  Their use of alcohol, tobacco or illicit drugs  Their current medications and supplements  The patient's functional ability including ADLs,fall risks, home safety risks, cognitive, and hearing and visual impairment  Diet and physical activities  Evidence for depression or mood disorders  The patient's weight, height, BMI, and visual acuity have been recorded in the chart. I have made referrals, counseling, and provided education to the patient based on review of the above and I have provided the patient with a written personalized care plan for preventive services.

## 2014-09-15 ENCOUNTER — Telehealth: Payer: Self-pay | Admitting: *Deleted

## 2014-09-15 LAB — PREALBUMIN: Prealbumin: 22 mg/dL (ref 17–34)

## 2014-09-15 LAB — VITAMIN D 25 HYDROXY (VIT D DEFICIENCY, FRACTURES): Vit D, 25-Hydroxy: 47 ng/mL (ref 30–100)

## 2014-09-15 NOTE — Telephone Encounter (Signed)
Reviewed lab results with patient daughter

## 2014-10-05 ENCOUNTER — Other Ambulatory Visit: Payer: Self-pay | Admitting: Cardiology

## 2014-10-06 ENCOUNTER — Other Ambulatory Visit: Payer: Self-pay

## 2014-10-06 MED ORDER — POTASSIUM CHLORIDE CRYS ER 20 MEQ PO TBCR: 20.0000 meq | EXTENDED_RELEASE_TABLET | Freq: Every day | ORAL | Status: DC

## 2014-10-10 ENCOUNTER — Other Ambulatory Visit: Payer: Self-pay | Admitting: Cardiology

## 2014-10-12 ENCOUNTER — Other Ambulatory Visit: Payer: Self-pay

## 2014-10-12 MED ORDER — FUROSEMIDE 20 MG PO TABS: 20.0000 mg | ORAL_TABLET | Freq: Every day | ORAL | Status: DC

## 2014-10-13 ENCOUNTER — Ambulatory Visit (INDEPENDENT_AMBULATORY_CARE_PROVIDER_SITE_OTHER): Payer: Medicare Other | Admitting: Cardiology

## 2014-10-13 ENCOUNTER — Encounter: Payer: Self-pay | Admitting: Cardiology

## 2014-10-13 VITALS — BP 122/70 | HR 75 | Ht 64.5 in | Wt 124.8 lb

## 2014-10-13 DIAGNOSIS — I5031 Acute diastolic (congestive) heart failure: Secondary | ICD-10-CM

## 2014-10-13 DIAGNOSIS — I10 Essential (primary) hypertension: Secondary | ICD-10-CM

## 2014-10-13 NOTE — Progress Notes (Signed)
HPI The patient presents for follow up.  Since I last saw her she has done well. The patient denies any new symptoms such as chest discomfort, neck or arm discomfort. There has been no new shortness of breath, PND or orthopnea. There have been no reported palpitations, presyncope or syncope.  She has a non productive "frog in my throat."  She has slowly progressive weakness consistent with advanced age.   No Known Allergies  Current Outpatient Prescriptions  Medication Sig Dispense Refill  . ADVAIR DISKUS 250-50 MCG/DOSE AEPB     . ALPRAZolam (XANAX) 0.5 MG tablet TAKE 1 TABLET 3 TIMES A DAY AS NEEDED 90 tablet 5  . Cholecalciferol (VITAMIN D3) 1000 UNITS CAPS Take 1,000 Units by mouth daily.     . cycloSPORINE (RESTASIS) 0.05 % ophthalmic emulsion Place 1 drop into both eyes 2 (two) times daily.     Marland Kitchen diltiazem (CARDIZEM CD) 120 MG 24 hr capsule Take 1 capsule (120 mg total) by mouth daily. 30 capsule 6  . ELIQUIS 2.5 MG TABS tablet TAKE 1 TABLET TWICE A DAY  (STOP WARFARIN) 180 tablet 1  . ferrous sulfate 325 (65 FE) MG tablet Take 325 mg by mouth daily with breakfast.      . furosemide (LASIX) 20 MG tablet Take 1 tablet (20 mg total) by mouth daily. 30 tablet 11  . KLOR-CON M20 20 MEQ tablet TAKE 1 TABLET BY MOUTH EVERY DAY 30 tablet 6  . levothyroxine (SYNTHROID, LEVOTHROID) 88 MCG tablet TAKE 1 TABLET BY MOUTH EVERY DAY 90 tablet 3  . metoprolol (LOPRESSOR) 50 MG tablet TAKE 1 TABLET TWICE A DAY 180 tablet 3  . Multiple Vitamins-Minerals (ICAPS) CAPS Take 1 capsule by mouth 2 (two) times daily.     . potassium chloride SA (KLOR-CON M20) 20 MEQ tablet Take 1 tablet (20 mEq total) by mouth daily. 30 tablet 6  . psyllium (METAMUCIL) 58.6 % powder Take 1 packet by mouth every evening.      No current facility-administered medications for this visit.    Past Medical History  Diagnosis Date  . Symptomatic sinus bradycardia     s/p pacemaker  . Atrial flutter     s/p RFCA  . Atrial  fibrillation     On warfarin  . Hypothyroidism   . GERD (gastroesophageal reflux disease)   . Esophageal stricture   . Anemia   . Asthma   . Tremor, essential   . Diverticulosis   . Vertigo   . Urinary, incontinence, stress female   . DJD (degenerative joint disease)     knees  . Renal insufficiency   . Hearing loss   . Macular degeneration   . HTN (hypertension)     a.  echo 3/06: EF 55-65%, mild LVH, mild RVH;   b.  Echo (06/10/13): EF 50-55%, normal wall motion, MAC, mild MR, mild RPE, PASP 40.    Past Surgical History  Procedure Laterality Date  . Pacemaker insertion    . Radiofrequency catheter ablation    . Transthoracic echocardiogram  06/10/2013    EF 50-55% Mild MR  . Permanent pacemaker generator change N/A 12/15/2013    Procedure: PERMANENT PACEMAKER GENERATOR CHANGE;  Surgeon: Evans Lance, MD;  Location: [facility] CATH LAB;  Service: Cardiovascular;  Laterality: N/A;    ROS:  As stated in the HPI and negative for all other systems.  PHYSICAL EXAM BP 122/70 mmHg  Pulse 75  Ht 5' 4.5" (1.638 m)  Wt  124 lb 12.8 oz (56.609 kg)  BMI 21.10 kg/m2 GENERAL:  Well appearing, but frail NECK:  No jugular venous distention, waveform within normal limits, carotid upstroke brisk and symmetric, no bruits, no thyromegaly LUNGS:  Clear to auscultation bilaterally BACK:  No CVA tenderness, lordosis CHEST:  Pacer pocket OK. HEART:  PMI not displaced or sustained,S1 and S2 within normal limits, no S3,  no clicks, no rubs, no murmurs, irregular ABD:  Flat, positive bowel sounds normal in frequency in pitch, no bruits, no rebound, no guarding, no midline pulsatile mass, no hepatomegaly, no splenomegaly EXT:  2 plus pulses throughout, mild edema, no cyanosis no clubbing  EKG:  Atrial fibrillation rate 80, no acute ST-T wave changes. 10/13/2014  ASSESSMENT AND PLAN  ATRIAL FIBRILLATION -  The patient  tolerates this rhythm and rate control and anticoagulation. We will continue with  the meds as listed.  CARDIAC PACEMAKER IN SITU -  She is up to date with follow up.   HYPERTENSION -  The blood pressure is at target. No change in medications is indicated. We will continue with therapeutic lifestyle changes (TLC).

## 2014-10-13 NOTE — Patient Instructions (Signed)
Dr Percival Spanish recommends that you schedule a follow-up appointment in 1 year. You will receive a reminder letter in the mail two months in advance. If you don't receive a letter, please call our office to schedule the follow-up appointment.

## 2014-10-14 ENCOUNTER — Ambulatory Visit (INDEPENDENT_AMBULATORY_CARE_PROVIDER_SITE_OTHER): Payer: Medicare Other | Admitting: *Deleted

## 2014-10-14 ENCOUNTER — Telehealth: Payer: Self-pay | Admitting: Cardiology

## 2014-10-14 DIAGNOSIS — I495 Sick sinus syndrome: Secondary | ICD-10-CM

## 2014-10-14 NOTE — Telephone Encounter (Signed)
Spoke with pt and reminded pt of remote transmission that is due today. Pt verbalized understanding.   

## 2014-10-14 NOTE — Progress Notes (Signed)
Remote pacemaker transmission.   

## 2014-10-20 LAB — CUP PACEART REMOTE DEVICE CHECK
Battery Remaining Longevity: 123 mo
Battery Voltage: 2.8 V
Brady Statistic AP VP Percent: 2 %
Brady Statistic AS VS Percent: 97 %
Lead Channel Impedance Value: 464 Ohm
Lead Channel Impedance Value: 536 Ohm
Lead Channel Pacing Threshold Amplitude: 1 V
Lead Channel Sensing Intrinsic Amplitude: 22.4 mV
Lead Channel Setting Pacing Pulse Width: 0.4 ms
Lead Channel Setting Sensing Sensitivity: 5.6 mV
MDC IDC MSMT BATTERY IMPEDANCE: 100 Ohm
MDC IDC MSMT LEADCHNL RA SENSING INTR AMPL: 2 mV
MDC IDC MSMT LEADCHNL RV PACING THRESHOLD PULSEWIDTH: 0.4 ms
MDC IDC SESS DTM: 20160713150012
MDC IDC SET LEADCHNL RA PACING AMPLITUDE: 2 V
MDC IDC SET LEADCHNL RV PACING AMPLITUDE: 2.5 V
MDC IDC STAT BRADY AP VS PERCENT: 0 %
MDC IDC STAT BRADY AS VP PERCENT: 1 %

## 2014-11-06 ENCOUNTER — Encounter: Payer: Self-pay | Admitting: Cardiology

## 2014-11-10 ENCOUNTER — Encounter: Payer: Self-pay | Admitting: Internal Medicine

## 2014-12-04 ENCOUNTER — Ambulatory Visit (INDEPENDENT_AMBULATORY_CARE_PROVIDER_SITE_OTHER): Payer: Medicare Other | Admitting: Pharmacist

## 2014-12-04 DIAGNOSIS — I4892 Unspecified atrial flutter: Secondary | ICD-10-CM

## 2014-12-04 DIAGNOSIS — I4891 Unspecified atrial fibrillation: Secondary | ICD-10-CM | POA: Diagnosis not present

## 2014-12-04 DIAGNOSIS — Z7901 Long term (current) use of anticoagulants: Secondary | ICD-10-CM

## 2014-12-04 NOTE — Progress Notes (Deleted)
Pt was started on Eliquis for *** on {DATE MONTH DAY SMOL:078675449}.    Reviewed patients medication list.  Pt {Is/is not:9024} currently on any combined P-gp and strong CYP3A4 inhibitors/inducers (ketoconazole, traconazole, ritonavir, carbamazepine, phenytoin, rifampin, St. John's wort).  Reviewed labs.  SCr ***, Weight ***, CrCl- ***.  Dose {Desc; appropriate/inappropriate:30686::"appropriate"} based on CrCl.   Hgb and HCT {EEF:00712}  A full discussion of the nature of anticoagulants has been carried out.  A benefit/risk analysis has been presented to the patient, so that they understand the justification for choosing anticoagulation with Eliquis at this time.  The need for compliance is stressed.  Pt is aware to take the medication twice daily.  Side effects of potential bleeding are discussed, including unusual colored urine or stools, coughing up blood or coffee ground emesis, nose bleeds or serious fall or head trauma.  Discussed signs and symptoms of stroke. The patient should avoid any OTC items containing aspirin or ibuprofen.  Avoid alcohol consumption.   Call if any signs of abnormal bleeding.  Discussed financial obligations and resolved any difficulty in obtaining medication.  Next lab test test in 6 months.

## 2014-12-04 NOTE — Progress Notes (Signed)
Pt was started on Eliquis 2.5mg  twice a day for AFIB on 11/06/13.    Reviewed patients medication list.  Pt is not currently on any combined P-gp and strong CYP3A4 inhibitors/inducers (ketoconazole, traconazole, ritonavir, carbamazepine, phenytoin, rifampin, St. John's wort).  Age 81 years old and Weight 56kg, Labs reviewed-SCr 1.63, Hgb and HCT WNL. Dose appropriate based on dosing criteria.     A full discussion of the nature of anticoagulants has been carried out.  A benefit/risk analysis has been presented to the patient, so that they understand the justification for choosing anticoagulation with Eliquis at this time.  The need for compliance is stressed.  Pt is aware to take the medication twice daily.  Side effects of potential bleeding are discussed, including unusual colored urine or stools, coughing up blood or coffee ground emesis, nose bleeds or serious fall or head trauma.  Discussed signs and symptoms of stroke. The patient should avoid any OTC items containing aspirin or ibuprofen.  Avoid alcohol consumption. Call if any signs of abnormal bleeding.  Discussed financial obligations and resolved any difficulty in obtaining medication.  Pt has been going to regular follow up with cardiology and PCP so no need for additional follow up with anticoagulation clinic.

## 2014-12-08 ENCOUNTER — Other Ambulatory Visit: Payer: Self-pay | Admitting: *Deleted

## 2014-12-08 MED ORDER — ADVAIR DISKUS 250-50 MCG/DOSE IN AEPB: 1.0000 | INHALATION_SPRAY | Freq: Two times a day (BID) | RESPIRATORY_TRACT | Status: DC

## 2014-12-24 ENCOUNTER — Other Ambulatory Visit: Payer: Self-pay | Admitting: Cardiology

## 2015-01-07 ENCOUNTER — Telehealth: Payer: Self-pay | Admitting: Cardiology

## 2015-01-07 NOTE — Telephone Encounter (Signed)
New message  4. Are you calling to see if we received your device transmission? Yes  Pt has to go out of town and request to have her signal sent in early. Please call

## 2015-01-07 NOTE — Telephone Encounter (Signed)
Pt rescheduled remote transmission from 01-14-15 to 01-18-15

## 2015-01-18 ENCOUNTER — Ambulatory Visit (INDEPENDENT_AMBULATORY_CARE_PROVIDER_SITE_OTHER): Payer: Medicare Other | Admitting: *Deleted

## 2015-01-18 ENCOUNTER — Telehealth: Payer: Self-pay | Admitting: Cardiology

## 2015-01-18 DIAGNOSIS — I495 Sick sinus syndrome: Secondary | ICD-10-CM | POA: Diagnosis not present

## 2015-01-18 NOTE — Progress Notes (Signed)
Remote pacemaker transmission.   

## 2015-01-18 NOTE — Telephone Encounter (Signed)
Spoke with pt and reminded pt of remote transmission that is due today. Pt verbalized understanding.   

## 2015-01-19 ENCOUNTER — Encounter: Payer: Self-pay | Admitting: Cardiology

## 2015-01-19 LAB — CUP PACEART REMOTE DEVICE CHECK
Brady Statistic AP VP Percent: 2 %
Brady Statistic AP VS Percent: 0 %
Brady Statistic AS VP Percent: 1 %
Implantable Lead Implant Date: 20051017
Implantable Lead Location: 753859
Implantable Lead Location: 753860
Implantable Lead Model: 5076
Implantable Lead Model: 5076
Lead Channel Impedance Value: 483 Ohm
Lead Channel Impedance Value: 527 Ohm
Lead Channel Pacing Threshold Amplitude: 1 V
Lead Channel Pacing Threshold Pulse Width: 0.4 ms
Lead Channel Sensing Intrinsic Amplitude: 1.4 mV
MDC IDC LEAD IMPLANT DT: 20051017
MDC IDC MSMT BATTERY IMPEDANCE: 100 Ohm
MDC IDC MSMT BATTERY REMAINING LONGEVITY: 124 mo
MDC IDC MSMT BATTERY VOLTAGE: 2.8 V
MDC IDC MSMT LEADCHNL RV SENSING INTR AMPL: 8 mV
MDC IDC SESS DTM: 20161017164738
MDC IDC SET LEADCHNL RA PACING AMPLITUDE: 2 V
MDC IDC SET LEADCHNL RV PACING AMPLITUDE: 2.5 V
MDC IDC SET LEADCHNL RV PACING PULSEWIDTH: 0.4 ms
MDC IDC SET LEADCHNL RV SENSING SENSITIVITY: 4 mV
MDC IDC STAT BRADY AS VS PERCENT: 97 %

## 2015-03-16 ENCOUNTER — Ambulatory Visit (INDEPENDENT_AMBULATORY_CARE_PROVIDER_SITE_OTHER): Payer: Medicare Other | Admitting: Internal Medicine

## 2015-03-16 ENCOUNTER — Encounter: Payer: Self-pay | Admitting: Internal Medicine

## 2015-03-16 VITALS — BP 126/74 | HR 93 | Temp 97.5°F | Resp 20 | Ht 64.5 in | Wt 125.0 lb

## 2015-03-16 DIAGNOSIS — E785 Hyperlipidemia, unspecified: Secondary | ICD-10-CM | POA: Diagnosis not present

## 2015-03-16 DIAGNOSIS — F411 Generalized anxiety disorder: Secondary | ICD-10-CM

## 2015-03-16 DIAGNOSIS — H9193 Unspecified hearing loss, bilateral: Secondary | ICD-10-CM

## 2015-03-16 DIAGNOSIS — N183 Chronic kidney disease, stage 3 unspecified: Secondary | ICD-10-CM

## 2015-03-16 DIAGNOSIS — G250 Essential tremor: Secondary | ICD-10-CM | POA: Diagnosis not present

## 2015-03-16 DIAGNOSIS — I10 Essential (primary) hypertension: Secondary | ICD-10-CM

## 2015-03-16 DIAGNOSIS — Z8669 Personal history of other diseases of the nervous system and sense organs: Secondary | ICD-10-CM | POA: Diagnosis not present

## 2015-03-16 DIAGNOSIS — Z950 Presence of cardiac pacemaker: Secondary | ICD-10-CM | POA: Diagnosis not present

## 2015-03-16 DIAGNOSIS — Z7901 Long term (current) use of anticoagulants: Secondary | ICD-10-CM

## 2015-03-16 DIAGNOSIS — H6122 Impacted cerumen, left ear: Secondary | ICD-10-CM

## 2015-03-16 DIAGNOSIS — Z8679 Personal history of other diseases of the circulatory system: Secondary | ICD-10-CM | POA: Diagnosis not present

## 2015-03-16 DIAGNOSIS — E039 Hypothyroidism, unspecified: Secondary | ICD-10-CM

## 2015-03-16 DIAGNOSIS — Z87898 Personal history of other specified conditions: Secondary | ICD-10-CM

## 2015-03-16 LAB — BASIC METABOLIC PANEL
BUN: 27 mg/dL — ABNORMAL HIGH (ref 7–25)
CHLORIDE: 103 mmol/L (ref 98–110)
CO2: 26 mmol/L (ref 20–31)
Calcium: 9.4 mg/dL (ref 8.6–10.4)
Creat: 1.64 mg/dL — ABNORMAL HIGH (ref 0.60–0.88)
Glucose, Bld: 82 mg/dL (ref 65–99)
Potassium: 4.3 mmol/L (ref 3.5–5.3)
SODIUM: 138 mmol/L (ref 135–146)

## 2015-03-16 LAB — TSH: TSH: 2.339 u[IU]/mL (ref 0.350–4.500)

## 2015-03-16 NOTE — Progress Notes (Signed)
   Subjective:    Patient ID: [name], female    DOB: 1921/09/20, 81 y.o.   MRN: [id number]  HPI Patient was told by home health nurse to do daily weights. These have been very stable at her home. She brings in a list of these weights and they range between 120 and 121 pounds. She weighs 125 pounds here today and weight 125 pounds in June. At one point we were concerned that she was not eating well. However weight is been stable over the past 6 months. Her family purchased her bilateral hearing aids. She's complaining of decreased hearing in left ear for the past several days. She was found to have of impacted cerumen which was removed today. She has a history of essential hypertension, hypothyroidism, pacemaker, atrial fibrillation requiring chronic anticoagulation, essential tremor, history of vertigo, GE reflux, anxiety, chronic kidney disease stage III No recent falls Had flu vaccine at local drug store about a month ago  Review of Systems     Objective:   Physical Exam  Impacted cerumen left ear removed by curette without difficulty. Cardiac exam irregular irregular rhythm. Neck supple no thyromegaly. No adenopathy. Chest clear to auscultation without rales or wheezing. Essential tremor present. Chest ambulates with a cane. Skin warm and dry. No lower extremity edema. No focal deficits on brief neurological exam other than essential tremor.      Assessment & Plan:  Chronic atrial fibrillation-is on chronic anticoagulation  Pacemaker-followed by cardiology  Impacted cerumen left ear-removed  Bilateral hearing loss-has bilateral hearing aids  History of vertigo-no recent episodes  Anxiety-stable with Xanax  Hypothyroidism-TSH pending. Is on chronic thyroid replacement  History of congestive heart failure-stable  Chronic kidney disease stage III-basic metabolic panel pending  Gait instability-ambulates with a cane with a cane    Plan: TSH and basic metabolic panel  checked. She has appointment with cardiology later this week. Return in 6 months for physical examination. Continue same medications.

## 2015-03-16 NOTE — Patient Instructions (Signed)
Continue same medications and return in 6 months. Lab work is pending. It was a pleasure to see you today.

## 2015-03-18 DIAGNOSIS — H353132 Nonexudative age-related macular degeneration, bilateral, intermediate dry stage: Secondary | ICD-10-CM | POA: Diagnosis not present

## 2015-03-18 DIAGNOSIS — H524 Presbyopia: Secondary | ICD-10-CM | POA: Diagnosis not present

## 2015-03-22 ENCOUNTER — Other Ambulatory Visit: Payer: Self-pay | Admitting: Internal Medicine

## 2015-04-08 ENCOUNTER — Other Ambulatory Visit: Payer: Self-pay | Admitting: Internal Medicine

## 2015-04-08 NOTE — Telephone Encounter (Signed)
Refill x 6 months 

## 2015-04-09 NOTE — Telephone Encounter (Signed)
Phoned to pharmacy 

## 2015-04-16 ENCOUNTER — Encounter: Payer: Medicare Other | Admitting: Internal Medicine

## 2015-04-22 ENCOUNTER — Encounter: Payer: Self-pay | Admitting: Internal Medicine

## 2015-04-27 ENCOUNTER — Telehealth: Payer: Self-pay | Admitting: Cardiology

## 2015-04-27 ENCOUNTER — Other Ambulatory Visit: Payer: Self-pay

## 2015-04-27 MED ORDER — APIXABAN 2.5 MG PO TABS: ORAL_TABLET | ORAL | Status: DC

## 2015-04-27 NOTE — Telephone Encounter (Signed)
errogenous encounter

## 2015-04-27 NOTE — Telephone Encounter (Signed)
Refill sent to the pharmacy electronically.  

## 2015-04-27 NOTE — Telephone Encounter (Signed)
°*  STAT* If patient is at the pharmacy, call can be transferred to refill team.   1. Which medications need to be refilled? (please list name of each medication and dose if known) Eliquis 2.5mg   2. Which pharmacy/location (including street and city if local pharmacy) is medication to be sent to?[facility]   3. Do they need a 30 day or 90 day supply? [place]

## 2015-05-05 ENCOUNTER — Encounter: Payer: Self-pay | Admitting: Internal Medicine

## 2015-05-05 ENCOUNTER — Ambulatory Visit (INDEPENDENT_AMBULATORY_CARE_PROVIDER_SITE_OTHER): Payer: Medicare Other | Admitting: Internal Medicine

## 2015-05-05 VITALS — BP 136/72 | HR 88 | Ht 65.0 in | Wt 127.2 lb

## 2015-05-05 DIAGNOSIS — I482 Chronic atrial fibrillation, unspecified: Secondary | ICD-10-CM

## 2015-05-05 DIAGNOSIS — I4891 Unspecified atrial fibrillation: Secondary | ICD-10-CM | POA: Insufficient documentation

## 2015-05-05 LAB — CUP PACEART INCLINIC DEVICE CHECK
Battery Impedance: 104 Ohm
Brady Statistic AP VP Percent: 2 %
Brady Statistic AP VS Percent: 0 %
Brady Statistic AS VP Percent: 1 %
Brady Statistic AS VS Percent: 97 %
Date Time Interrogation Session: 20170201181008
Implantable Lead Implant Date: 20051017
Implantable Lead Implant Date: 20051017
Implantable Lead Location: 753860
Implantable Lead Model: 5076
Lead Channel Impedance Value: 454 Ohm
Lead Channel Impedance Value: 641 Ohm
Lead Channel Sensing Intrinsic Amplitude: 11.2 mV
Lead Channel Setting Pacing Amplitude: 2.5 V
MDC IDC LEAD LOCATION: 753859
MDC IDC MSMT BATTERY REMAINING LONGEVITY: 123 mo
MDC IDC MSMT BATTERY VOLTAGE: 2.8 V
MDC IDC MSMT LEADCHNL RA SENSING INTR AMPL: 0.7 mV
MDC IDC MSMT LEADCHNL RV PACING THRESHOLD AMPLITUDE: 1 V
MDC IDC MSMT LEADCHNL RV PACING THRESHOLD PULSEWIDTH: 0.4 ms
MDC IDC SET LEADCHNL RA PACING AMPLITUDE: 2 V
MDC IDC SET LEADCHNL RV PACING PULSEWIDTH: 0.4 ms
MDC IDC SET LEADCHNL RV SENSING SENSITIVITY: 4 mV

## 2015-05-05 NOTE — Progress Notes (Signed)
HPI [name] returns today for followup. She is a very pleasant 82 year old woman with a history of symptomatic bradycardia, paroxysmal atrial fibrillation, status post permanent pacemaker insertion. In the interim, she has done well. She denies chest pain or sob. She now has chronic atrial fib and is asymptomatic. She denies syncope. She is only taking one dose of short acting beta blocker a day. She denies peripheral edema. No Known Allergies   Current Outpatient Prescriptions  Medication Sig Dispense Refill  . ADVAIR DISKUS 250-50 MCG/DOSE AEPB Inhale 1 puff into the lungs 2 (two) times daily. 60 each 3  . ALPRAZolam (XANAX) 0.5 MG tablet TAKE 1 TABLET BY MOUTH 3 TIMES A DAY AS NEEDED 90 tablet 5  . apixaban (ELIQUIS) 2.5 MG TABS tablet TAKE 1 TABLET TWICE A DAY  (STOP WARFARIN) 180 tablet 2  . Cholecalciferol (VITAMIN D3) 1000 UNITS CAPS Take 1,000 Units by mouth daily.     Marland Kitchen diltiazem (CARDIZEM CD) 120 MG 24 hr capsule TAKE ONE CAPSULE BY MOUTH EVERY DAY 30 capsule 6  . ferrous sulfate 325 (65 FE) MG tablet Take 325 mg by mouth daily with breakfast.      . furosemide (LASIX) 20 MG tablet Take 1 tablet (20 mg total) by mouth daily. 30 tablet 11  . KLOR-CON M20 20 MEQ tablet TAKE 1 TABLET BY MOUTH EVERY DAY 30 tablet 6  . levothyroxine (SYNTHROID, LEVOTHROID) 88 MCG tablet TAKE 1 TABLET BY MOUTH EVERY DAY 90 tablet 3  . metoprolol (LOPRESSOR) 50 MG tablet TAKE 1 TABLET TWICE A DAY 180 tablet 3  . Multiple Vitamins-Minerals (ICAPS) CAPS Take 1 capsule by mouth 2 (two) times daily.     . psyllium (METAMUCIL) 58.6 % powder Take 1 packet by mouth every evening.      No current facility-administered medications for this visit.     Past Medical History  Diagnosis Date  . Symptomatic sinus bradycardia     s/p pacemaker  . Atrial flutter ([place])     s/p RFCA  . Atrial fibrillation ([place])   . Hypothyroidism   . GERD (gastroesophageal reflux disease)   . Esophageal stricture   . Anemia   .  Asthma   . Tremor, essential   . Diverticulosis   . Vertigo   . Urinary, incontinence, stress female   . DJD (degenerative joint disease)     knees  . Renal insufficiency   . Hearing loss   . Macular degeneration   . HTN (hypertension)     a.  echo 3/06: EF 55-65%, mild LVH, mild RVH;   b.  Echo (06/10/13): EF 50-55%, normal wall motion, MAC, mild MR, mild RPE, PASP 40.    ROS:   All systems reviewed and negative except as noted in the HPI.   Past Surgical History  Procedure Laterality Date  . Pacemaker insertion    . Radiofrequency catheter ablation    . Transthoracic echocardiogram  06/10/2013    EF 50-55% Mild MR  . Permanent pacemaker generator change N/A 12/15/2013    Procedure: PERMANENT PACEMAKER GENERATOR CHANGE;  Surgeon: Evans Lance, MD;  Location: [facility] CATH LAB;  Service: Cardiovascular;  Laterality: N/A;     Family History  Problem Relation Age of Onset  . Cancer Mother     breast  . Cancer Father     leukemia  . Cirrhosis Brother     deceased     Social History   Social History  . Marital Status: Widowed  Spouse Name: N/A  . Number of Children: 2  . Years of Education: N/A   Occupational History  . 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 Smoking status: Former Smoker    Types: Cigarettes    Quit date: 02/04/1962  . Smokeless tobacco: Never Used  . Alcohol Use: Yes     Comment: rarely  . Drug Use: No  . Sexual Activity: Not on file   Other Topics Concern  . Not on file   Social History Narrative     BP 136/72 mmHg  Pulse 88  Ht 5\' 5"  (1.651 m)  Wt 127 lb 3.2 oz (57.698 kg)  BMI 21.17 kg/m2  SpO2 98%  Physical Exam:  Well appearing 82 year old woman, NAD HEENT: Unremarkable Neck:  6 cm JVD, no thyromegally Lungs:  Clear with no wheezes, rales, or rhonchi. PPM is well healed. HEART:  IRegular rate rhythm, no murmurs, no rubs, no clicks Abd:  soft, positive bowel sounds, no organomegally, no rebound, no  guarding Ext:  2 plus pulses, no edema, no cyanosis, no clubbing Skin:  No rashes no nodules Neuro:  CN II through XII intact, motor grossly intact  DEVICE  Normal device function.  See PaceArt for details.   Assess/Plan:  1. Atrial fib -her rate is controlled. She will continue her anti-coagulation 2. HTN - her blood pressure is controlled.  3. PM - her medtronic DDD PM is working normally. Will recheck in several months.   [name].D.

## 2015-05-05 NOTE — Patient Instructions (Signed)
Medication Instructions:  Your physician recommends that you continue on your current medications as directed. Please refer to the Current Medication list given to you today.   Labwork: None ordered   Testing/Procedures: None ordered   Follow-Up: Your physician wants you to follow-up in: 12 months with Dr Knox Saliva will receive a reminder letter in the mail two months in advance. If you don't receive a letter, please call our office to schedule the follow-up appointment.  Remote monitoring is used to monitor your Pacemaker from home. This monitoring reduces the number of office visits required to check your device to one time per year. It allows [place] to keep an eye on the functioning of your device to ensure it is working properly. You are scheduled for a device check from home on 08/04/15. You [place] send your transmission at any time that day. If you have a wireless device, the transmission will be sent automatically. After your physician reviews your transmission, you will receive a postcard with your next transmission date.    Any Other Special Instructions Will Be Listed Below (If Applicable).     If you need a refill on your cardiac medications before your next appointment, please call your pharmacy.

## 2015-06-09 ENCOUNTER — Other Ambulatory Visit: Payer: Self-pay | Admitting: Internal Medicine

## 2015-06-09 NOTE — Telephone Encounter (Signed)
Phoned to pharmacy 

## 2015-06-09 NOTE — Telephone Encounter (Signed)
Refill x 6 months 

## 2015-07-01 ENCOUNTER — Other Ambulatory Visit: Payer: Self-pay | Admitting: Internal Medicine

## 2015-07-15 ENCOUNTER — Other Ambulatory Visit: Payer: Self-pay | Admitting: Cardiology

## 2015-07-15 NOTE — Telephone Encounter (Signed)
Rx(s) sent to pharmacy electronically.  

## 2015-08-04 ENCOUNTER — Ambulatory Visit: Payer: Medicare Other | Admitting: *Deleted

## 2015-08-05 NOTE — Progress Notes (Signed)
Opened in error transmission not received  

## 2015-08-06 ENCOUNTER — Encounter: Payer: Self-pay | Admitting: Cardiology

## 2015-08-16 ENCOUNTER — Ambulatory Visit (INDEPENDENT_AMBULATORY_CARE_PROVIDER_SITE_OTHER): Payer: Medicare Other | Admitting: *Deleted

## 2015-08-16 ENCOUNTER — Telehealth: Payer: Self-pay | Admitting: Internal Medicine

## 2015-08-16 DIAGNOSIS — Z950 Presence of cardiac pacemaker: Secondary | ICD-10-CM | POA: Diagnosis not present

## 2015-08-16 DIAGNOSIS — I495 Sick sinus syndrome: Secondary | ICD-10-CM | POA: Diagnosis not present

## 2015-08-16 NOTE — Telephone Encounter (Signed)
Spoke w/ pt and informed her that her remote transmission was received on 08-15-15. Pt verbalized understanding.

## 2015-08-16 NOTE — Telephone Encounter (Signed)
New Message   4. Are you calling to see if we received your device transmission? No. Pt called states that she has forgotten how to send a remote check. Please call

## 2015-08-17 NOTE — Progress Notes (Signed)
Remote pacemaker transmission.   

## 2015-09-03 LAB — CUP PACEART REMOTE DEVICE CHECK
Brady Statistic AP VP Percent: 2 %
Brady Statistic AP VS Percent: 0.4 %
Brady Statistic AS VP Percent: 0.9 %
Brady Statistic AS VS Percent: 96.7 %
Date Time Interrogation Session: 20170602134702
Implantable Lead Implant Date: 20051017
Implantable Lead Implant Date: 20051017
Implantable Lead Location: 753859
Implantable Lead Model: 5076
Lead Channel Pacing Threshold Pulse Width: 0.4 ms
Lead Channel Setting Pacing Amplitude: 2 V
Lead Channel Setting Pacing Amplitude: 2.5 V
Lead Channel Setting Sensing Sensitivity: 4 mV
MDC IDC LEAD LOCATION: 753860
MDC IDC MSMT LEADCHNL RA SENSING INTR AMPL: 1 mV
MDC IDC MSMT LEADCHNL RV PACING THRESHOLD AMPLITUDE: 1 V
MDC IDC MSMT LEADCHNL RV SENSING INTR AMPL: 8 mV
MDC IDC SET LEADCHNL RV PACING PULSEWIDTH: 0.4 ms

## 2015-09-15 ENCOUNTER — Encounter: Payer: Self-pay | Admitting: Cardiology

## 2015-09-21 ENCOUNTER — Encounter: Payer: Self-pay | Admitting: Internal Medicine

## 2015-09-21 ENCOUNTER — Ambulatory Visit (INDEPENDENT_AMBULATORY_CARE_PROVIDER_SITE_OTHER): Payer: Medicare Other | Admitting: Internal Medicine

## 2015-09-21 DIAGNOSIS — I482 Chronic atrial fibrillation, unspecified: Secondary | ICD-10-CM

## 2015-09-21 DIAGNOSIS — F411 Generalized anxiety disorder: Secondary | ICD-10-CM

## 2015-09-21 DIAGNOSIS — H3530 Unspecified macular degeneration: Secondary | ICD-10-CM | POA: Diagnosis not present

## 2015-09-21 DIAGNOSIS — H8110 Benign paroxysmal vertigo, unspecified ear: Secondary | ICD-10-CM | POA: Diagnosis not present

## 2015-09-21 DIAGNOSIS — I10 Essential (primary) hypertension: Secondary | ICD-10-CM

## 2015-09-21 DIAGNOSIS — Z13220 Encounter for screening for lipoid disorders: Secondary | ICD-10-CM

## 2015-09-21 DIAGNOSIS — N183 Chronic kidney disease, stage 3 unspecified: Secondary | ICD-10-CM

## 2015-09-21 DIAGNOSIS — E039 Hypothyroidism, unspecified: Secondary | ICD-10-CM

## 2015-09-21 DIAGNOSIS — Z7901 Long term (current) use of anticoagulants: Secondary | ICD-10-CM

## 2015-09-21 DIAGNOSIS — Z0000 Encounter for general adult medical examination without abnormal findings: Secondary | ICD-10-CM | POA: Diagnosis not present

## 2015-09-21 DIAGNOSIS — Z950 Presence of cardiac pacemaker: Secondary | ICD-10-CM

## 2015-09-21 DIAGNOSIS — Z130 Encounter for screening for diseases of the blood and blood-forming organs and certain disorders involving the immune mechanism: Secondary | ICD-10-CM

## 2015-09-21 DIAGNOSIS — H9193 Unspecified hearing loss, bilateral: Secondary | ICD-10-CM | POA: Diagnosis not present

## 2015-09-21 DIAGNOSIS — E559 Vitamin D deficiency, unspecified: Secondary | ICD-10-CM

## 2015-09-21 LAB — CBC WITH DIFFERENTIAL/PLATELET
BASOS ABS: 66 {cells}/uL (ref 0–200)
Basophils Relative: 1 %
EOS PCT: 8 %
Eosinophils Absolute: 528 cells/uL — ABNORMAL HIGH (ref 15–500)
HCT: 37.7 % (ref 35.0–45.0)
Hemoglobin: 12.3 g/dL (ref 11.7–15.5)
Lymphocytes Relative: 27 %
Lymphs Abs: 1782 cells/uL (ref 850–3900)
MCH: 31.9 pg (ref 27.0–33.0)
MCHC: 32.6 g/dL (ref 32.0–36.0)
MCV: 97.7 fL (ref 80.0–100.0)
MONOS PCT: 11 %
MPV: 10.2 fL (ref 7.5–12.5)
Monocytes Absolute: 726 cells/uL (ref 200–950)
NEUTROS ABS: 3498 {cells}/uL (ref 1500–7800)
NEUTROS PCT: 53 %
PLATELETS: 247 10*3/uL (ref 140–400)
RBC: 3.86 MIL/uL (ref 3.80–5.10)
RDW: 13.9 % (ref 11.0–15.0)
WBC: 6.6 10*3/uL (ref 3.8–10.8)

## 2015-09-21 LAB — COMPLETE METABOLIC PANEL WITH GFR
ALT: 8 U/L (ref 6–29)
AST: 10 U/L (ref 10–35)
Albumin: 4 g/dL (ref 3.6–5.1)
Alkaline Phosphatase: 87 U/L (ref 33–130)
BUN: 23 mg/dL (ref 7–25)
CHLORIDE: 103 mmol/L (ref 98–110)
CO2: 26 mmol/L (ref 20–31)
Calcium: 9.4 mg/dL (ref 8.6–10.4)
Creat: 1.6 mg/dL — ABNORMAL HIGH (ref 0.60–0.88)
GFR, Est African American: 32 mL/min — ABNORMAL LOW (ref >=60)
GFR, Est Non African American: 28 mL/min — ABNORMAL LOW (ref >=60)
GLUCOSE: 89 mg/dL (ref 65–99)
POTASSIUM: 4.3 mmol/L (ref 3.5–5.3)
SODIUM: 138 mmol/L (ref 135–146)
Total Bilirubin: 0.8 mg/dL (ref 0.2–1.2)
Total Protein: 6.4 g/dL (ref 6.1–8.1)

## 2015-09-21 LAB — POCT URINALYSIS DIPSTICK
BILIRUBIN UA: NEGATIVE
GLUCOSE UA: NEGATIVE
Ketones, UA: NEGATIVE
Leukocytes, UA: NEGATIVE
NITRITE UA: NEGATIVE
Protein, UA: NEGATIVE
RBC UA: NEGATIVE
SPEC GRAV UA: 1.02
Urobilinogen, UA: 0.2
pH, UA: 7

## 2015-09-21 LAB — LIPID PANEL
CHOL/HDL RATIO: 2.7 ratio (ref <=5.0)
Cholesterol: 156 mg/dL (ref 125–200)
HDL: 57 mg/dL (ref >=46)
LDL CALC: 76 mg/dL (ref <130)
Triglycerides: 113 mg/dL (ref <150)
VLDL: 23 mg/dL (ref <30)

## 2015-09-21 LAB — TSH: TSH: 1.54 m[IU]/L

## 2015-09-21 NOTE — Patient Instructions (Signed)
It was a pleasure to see you today. Lab work is pending. Continue same medications and return in 6 months. Have annual flu vaccine.

## 2015-09-21 NOTE — Progress Notes (Signed)
Subjective:    Patient ID: [name], female    DOB: October 03, 1921, 82 y.o.   MRN: [id number]  HPI 82 year old female in today for health maintenance exam and evaluation of medical problems. She continues to live alone. Her daughter visits her on weekends and does shopping for her. Patient no longer drives. She is not had a fall in the past year. Ambulates outside with a cane. She has a pacemaker. She has chronic atrial fibrillation and some chronic anticoagulation therapy. History of acute diastolic heart failure in 2015 precipitated by atrial fibrillation with rapid ventricular response. History of hypertension. Pacemaker was placed for sick sinus syndrome in 2005.  She had an esophageal stricture dilated in 1996. Long-standing history of GE reflux for currently not on PPI.  History of stress urinary incontinence, osteoarthritis, chronic kidney disease, hearing loss in both ears, history of anemia. History of anxiety. History of Schatzki's  ring dilated 1992. Long-standing history of hypothyroidism. History of essential tremor that started in the early 1990s.  Colonoscopy done in 2001. Not repeated due to age.  Cataract extraction right eye 1992 in the left eye in 1993.  History of right middle lobe pneumonia in 1996.  Immunizations are up-to-date.  Social history: She is a widow. She is retired Chief Technology Officer. She has maintained good physical conditioning. Formerly smoked a pack of cigarettes daily for some 5 years but quit in the 1960s. Daughter resides in [place]. Son resides in [place].  For a time, weight loss has been an issue. She weighed 135.5 pounds in June 2015. Weight in June 2016 was 125 pounds. Weight nail is stable at 127 pounds. No GI complaints.  Family history: Father died at age 76 due to acute leukemia. Mother died at age 70 of kidney failure. One brother died at age 86 of cirrhosis of the liver. One sister.    Review of Systems  Constitutional:  Positive for fatigue.  HENT:       Bilateral hearing loss. Wears bilateral hearing aids  Respiratory: Negative.   Cardiovascular: Negative for chest pain, palpitations and leg swelling.  Genitourinary: Negative.   Neurological:       History of essential tremor  Psychiatric/Behavioral: Negative.        Objective:   Physical Exam  Constitutional: She is oriented to person, place, and time. She appears well-developed and well-nourished. No distress.  HENT:  Head: Normocephalic and atraumatic.  Right Ear: External ear normal.  Left Ear: External ear normal.  Mouth/Throat: Oropharynx is clear and moist. No oropharyngeal exudate.  Eyes: Conjunctivae and EOM are normal. Pupils are equal, round, and reactive to light. Right eye exhibits no discharge. Left eye exhibits no discharge. No scleral icterus.  Neck: Neck supple. No JVD present. No thyromegaly present.  Cardiovascular: Normal rate, regular rhythm, normal heart sounds and intact distal pulses.   No murmur heard. Pulmonary/Chest: Effort normal and breath sounds normal. No respiratory distress. She has no wheezes. She has no rales.  Abdominal: Soft. Bowel sounds are normal. She exhibits no distension. There is no tenderness. There is no rebound and no guarding.  Genitourinary:  Deferred due to age  Musculoskeletal: She exhibits no edema.  Lymphadenopathy:    She has no cervical adenopathy.  Neurological: She is alert and oriented to person, place, and time. She has normal reflexes. No cranial nerve deficit. Coordination normal.  Skin: Skin is warm and dry. No rash noted. She is not diaphoretic.  Psychiatric: She has a normal mood  and affect. Her behavior is normal. Judgment and thought content normal.  Vitals reviewed.         Assessment & Plan:  Normal health maintenance exam  Chronic atrial fibrillation on chronic anticoagulation therapy  Essential tremor  Hypothyroidism  Chronic kidney disease-continue to  monitor  Essential hypertension-stable  History of acute diastolic heart failure  Macular degeneration  History of benign positional vertigo  Anxiety  Plan: Continue same medications and return in 6 months. Fasting lab work drawn and pending.  Subjective:   Patient presents for Medicare Annual/Subsequent preventive examination.  Review Past Medical/Family/Social:See above   Risk Factors  Current exercise habits: Sedentary Dietary issues discussed:   Cardiac risk factors:  Depression Screen  (Note: if answer to either of the following is "Yes", a more complete depression screening is indicated)   Over the past two weeks, have you felt down, depressed or hopeless? No  Over the past two weeks, have you felt little interest or pleasure in doing things? No Have you lost interest or pleasure in daily life? No Do you often feel hopeless? No Do you cry easily over simple problems? No   Activities of Daily Living  In your present state of health, do you have any difficulty performing the following activities?:   Driving? Does not drive Managing money? No  Feeding yourself? No  Getting from bed to chair? No  Climbing a flight of stairs? Yes-not a strong as in the past Preparing food and eating?: Yes Bathing or showering? No  Getting dressed: No  Getting to the toilet? No  Using the toilet:No  Moving around from place to place: No  In the past year have you fallen or had a near fall?:No  Are you sexually active? No  Do you have more than one partner? No   Hearing Difficulties: No  Do you often ask people to speak up or repeat themselves? Yes Do you experience ringing or noises in your ears? Yes Do you have difficulty understanding soft or whispered voices? Yes Do you feel that you have a problem with memory? Some Do you often misplace items? No    Home Safety:  Do you have a smoke alarm at your residence? Yes Do you have grab bars in the bathroom?Yes Do you have  throw rugs in your house? No   Cognitive Testing  Alert? Yes Normal Appearance?Yes  Oriented to person? Yes Place? Yes  Time? Yes  Recall of three objects? Yes  Can perform simple calculations? Yes  Displays appropriate judgment?Yes  Can read the correct time from a watch face?Yes   List the Names of Other Physician/Practitioners you currently use:  See referral list for the physicians patient is currently seeing.  Cardiologist   Review of Systems: See above   Objective:     General appearance: Appears stated age .Thin and slightly frail Head: Normocephalic, without obvious abnormality, atraumatic  Eyes: conj clear, EOMi PEERLA  Ears: normal TM's and external ear canals both ears  Nose: Nares normal. Septum midline. Mucosa normal. No drainage or sinus tenderness.  Throat: lips, mucosa, and tongue normal; teeth and gums normal  Neck: no adenopathy, no carotid bruit, no JVD, supple, symmetrical, trachea midline and thyroid not enlarged, symmetric, no tenderness/mass/nodules  No CVA tenderness.  Lungs: clear to auscultation bilaterally  Breasts: normal appearance, no masses or tenderness, top of the pacemaker on left upper chest.  Heart: regular rate and rhythm, S1, S2 normal, no murmur, click, rub or gallop  Abdomen: soft, non-tender; bowel sounds normal; no masses, no organomegaly  Musculoskeletal: ROM normal in all joints, no crepitus, no deformity, Normal muscle strengthen. Back  is symmetric, no curvature. Skin: Skin color, texture, turgor normal. No rashes or lesions  Lymph nodes: Cervical, supraclavicular, and axillary nodes normal.  Neurologic: CN 2 -12 Normal, Normal symmetric reflexes. Normal coordination and gait  Psych: Alert & Oriented x 3, Mood appear stable.    Assessment:    Annual wellness medicare exam   Plan:    During the course of the visit the patient was educated and counseled about appropriate screening and preventive services including:    Declines mammogram  Annual flu vaccine      Patient Instructions (the written plan) was given to the patient.  Medicare Attestation  I have personally reviewed:  The patient's medical and social history  Their use of alcohol, tobacco or illicit drugs  Their current medications and supplements  The patient's functional ability including ADLs,fall risks, home safety risks, cognitive, and hearing and visual impairment  Diet and physical activities  Evidence for depression or mood disorders  The patient's weight, height, BMI, and visual acuity have been recorded in the chart. I have made referrals, counseling, and provided education to the patient based on review of the above and I have provided the patient with a written personalized care plan for preventive services.

## 2015-09-22 LAB — VITAMIN D 25 HYDROXY (VIT D DEFICIENCY, FRACTURES): Vit D, 25-Hydroxy: 45 ng/mL (ref 30–100)

## 2015-10-26 NOTE — Progress Notes (Signed)
HPI The patient presents for follow up.  Since I last saw her she has had follow up with Dr. Lovena Le.  She has done well from a cardiovascular standpoint since I saw her. She's a little slower to get around but she still does her household chores and lives independently. She has some joint pains. She has ongoing hoarseness. However, she doesn't really notice palpitations she's had no presyncope or syncope. She denies any chest pressure, neck or arm discomfort. She's had no issues with new shortness of breath, PND or orthopnea. She's had no problems with bleeding related to her medications.  No Known Allergies  Current Outpatient Prescriptions  Medication Sig Dispense Refill  . ADVAIR DISKUS 250-50 MCG/DOSE AEPB Inhale 1 puff into the lungs 2 (two) times daily. 60 each 3  . ALPRAZolam (XANAX) 0.5 MG tablet TAKE 1 TABLET BY MOUTH 3 TIMES A DAY AS NEEDED 90 tablet 5  . apixaban (ELIQUIS) 2.5 MG TABS tablet TAKE 1 TABLET TWICE A DAY  (STOP WARFARIN) 180 tablet 2  . Cholecalciferol (VITAMIN D3) 1000 UNITS CAPS Take 1,000 Units by mouth daily.     Marland Kitchen diltiazem (CARDIZEM CD) 120 MG 24 hr capsule TAKE ONE CAPSULE BY MOUTH EVERY DAY 30 capsule 3  . ferrous sulfate 325 (65 FE) MG tablet Take 325 mg by mouth daily with breakfast.      . furosemide (LASIX) 20 MG tablet Take 1 tablet (20 mg total) by mouth daily. 30 tablet 11  . KLOR-CON M20 20 MEQ tablet TAKE 1 TABLET BY MOUTH EVERY DAY 30 tablet 6  . levothyroxine (SYNTHROID, LEVOTHROID) 88 MCG tablet TAKE 1 TABLET BY MOUTH EVERY DAY 90 tablet 3  . metoprolol (LOPRESSOR) 50 MG tablet TAKE 1 TABLET BY MOUTH TWICE A DAY 180 tablet 3  . Multiple Vitamins-Minerals (ICAPS) CAPS Take 1 capsule by mouth 2 (two) times daily.     . psyllium (METAMUCIL) 58.6 % powder Take 1 packet by mouth every evening.      No current facility-administered medications for this visit.     Past Medical History:  Diagnosis Date  . Anemia   . Asthma   . Atrial fibrillation  ([place])   . Atrial flutter ([place])    s/p RFCA  . Diverticulosis   . DJD (degenerative joint disease)    knees  . Esophageal stricture   . GERD (gastroesophageal reflux disease)   . Hearing loss   . HTN (hypertension)    a.  echo 3/06: EF 55-65%, mild LVH, mild RVH;   b.  Echo (06/10/13): EF 50-55%, normal wall motion, MAC, mild MR, mild RPE, PASP 40.  Marland Kitchen Hypothyroidism   . Macular degeneration   . Renal insufficiency   . Symptomatic sinus bradycardia    s/p pacemaker  . Tremor, essential   . Urinary, incontinence, stress female   . Vertigo     Past Surgical History:  Procedure Laterality Date  . PACEMAKER INSERTION    . PERMANENT PACEMAKER GENERATOR CHANGE N/A 12/15/2013   Procedure: PERMANENT PACEMAKER GENERATOR CHANGE;  Surgeon: Evans Lance, MD;  Location: [facility] CATH LAB;  Service: Cardiovascular;  Laterality: N/A;  . radiofrequency catheter ablation    . TRANSTHORACIC ECHOCARDIOGRAM  06/10/2013   EF 50-55% Mild MR    ROS:  As stated in the HPI and negative for all other systems.  PHYSICAL EXAM BP (!) 118/56   Pulse 85   Ht 5\' 5"  (1.651 m)   Wt 129 lb (58.5 kg)  BMI 21.47 kg/m  GENERAL:  Well appearing, but frail NECK:  No jugular venous distention, waveform within normal limits, carotid upstroke brisk and symmetric, no bruits, no thyromegaly LUNGS:  Clear to auscultation bilaterally BACK:  No CVA tenderness, lordosis CHEST:  Pacer pocket OK. HEART:  PMI not displaced or sustained,S1 and S2 within normal limits, no S3,  no clicks, no rubs, no murmurs, irregular ABD:  Flat, positive bowel sounds normal in frequency in pitch, no bruits, no rebound, no guarding, no midline pulsatile mass, no hepatomegaly, no splenomegaly EXT:  2 plus pulses throughout, mild edema, no cyanosis no clubbing  EKG:  Atrial fibrillation rate 76, no acute ST-T wave changes.  PVCs. 10/28/2015  ASSESSMENT AND PLAN  ATRIAL FIBRILLATION -  The patient  tolerates this rhythm and rate control and  anticoagulation. We will continue with the meds as listed.   [name] has a CHA2DS2 - VASc score of 4 with a risk of stroke of 4%.  CARDIAC PACEMAKER IN SITU -  She is up to date with follow up.   HYPERTENSION -  The blood pressure is at target. No change in medications is indicated. We will continue with therapeutic lifestyle changes (TLC).

## 2015-10-28 ENCOUNTER — Other Ambulatory Visit: Payer: Self-pay | Admitting: Cardiology

## 2015-10-28 ENCOUNTER — Ambulatory Visit (INDEPENDENT_AMBULATORY_CARE_PROVIDER_SITE_OTHER): Payer: Medicare Other | Admitting: Cardiology

## 2015-10-28 ENCOUNTER — Encounter: Payer: Self-pay | Admitting: Cardiology

## 2015-10-28 VITALS — BP 118/56 | HR 85 | Ht 65.0 in | Wt 129.0 lb

## 2015-10-28 DIAGNOSIS — I482 Chronic atrial fibrillation, unspecified: Secondary | ICD-10-CM

## 2015-10-28 DIAGNOSIS — I10 Essential (primary) hypertension: Secondary | ICD-10-CM

## 2015-10-28 NOTE — Telephone Encounter (Signed)
REFILL 

## 2015-10-28 NOTE — Patient Instructions (Signed)
Your physician wants you to follow-up in: 1 Year. You will receive a reminder letter in the mail two months in advance. If you don't receive a letter, please call our office to schedule the follow-up appointment.  

## 2015-10-29 ENCOUNTER — Other Ambulatory Visit: Payer: Self-pay | Admitting: Internal Medicine

## 2015-10-29 NOTE — Telephone Encounter (Signed)
Refilled for 6 months  

## 2015-11-14 ENCOUNTER — Other Ambulatory Visit: Payer: Self-pay | Admitting: Cardiology

## 2015-11-16 ENCOUNTER — Telehealth: Payer: Self-pay | Admitting: Cardiology

## 2015-11-16 ENCOUNTER — Ambulatory Visit (INDEPENDENT_AMBULATORY_CARE_PROVIDER_SITE_OTHER): Payer: Medicare Other | Admitting: *Deleted

## 2015-11-16 DIAGNOSIS — I495 Sick sinus syndrome: Secondary | ICD-10-CM

## 2015-11-16 NOTE — Progress Notes (Signed)
Remote pacemaker transmission.   

## 2015-11-16 NOTE — Telephone Encounter (Signed)
Spoke with pt and reminded pt of remote transmission that is due today. Pt verbalized understanding.   

## 2015-11-17 ENCOUNTER — Encounter: Payer: Self-pay | Admitting: Cardiology

## 2015-12-07 LAB — CUP PACEART REMOTE DEVICE CHECK
Battery Remaining Longevity: 112 mo
Battery Voltage: 2.8 V
Implantable Lead Implant Date: 20051017
Implantable Lead Location: 753859
Implantable Lead Location: 753860
Lead Channel Pacing Threshold Amplitude: 1 V
Lead Channel Pacing Threshold Pulse Width: 0.4 ms
Lead Channel Setting Pacing Amplitude: 2 V
Lead Channel Setting Pacing Pulse Width: 0.4 ms
Lead Channel Setting Sensing Sensitivity: 4 mV
MDC IDC LEAD IMPLANT DT: 20051017
MDC IDC MSMT BATTERY IMPEDANCE: 153 Ohm
MDC IDC MSMT LEADCHNL RA IMPEDANCE VALUE: 536 Ohm
MDC IDC MSMT LEADCHNL RA SENSING INTR AMPL: 1 mV
MDC IDC MSMT LEADCHNL RV IMPEDANCE VALUE: 470 Ohm
MDC IDC MSMT LEADCHNL RV SENSING INTR AMPL: 8 mV
MDC IDC SESS DTM: 20170815151058
MDC IDC SET LEADCHNL RV PACING AMPLITUDE: 2.5 V
MDC IDC STAT BRADY AP VP PERCENT: 2 %
MDC IDC STAT BRADY AP VS PERCENT: 0 %
MDC IDC STAT BRADY AS VP PERCENT: 1 %
MDC IDC STAT BRADY AS VS PERCENT: 97 %

## 2015-12-16 ENCOUNTER — Telehealth: Payer: Self-pay | Admitting: Internal Medicine

## 2015-12-16 NOTE — Telephone Encounter (Signed)
Patient calling; states that she feels she needs to be seen, but she doesn't have ANY complaints other than she's TIRED!  She doesn't have fever, no body aches, no cough, congestion.  She went to [place] last week and they came back home on Thursday of last week.  She thought by Monday of this week she would be rested, she states as of today, she still feels exhausted.  No mention of chest pain.  States she's just weak and feels drained.  Says that she's taking her thyroid medication as prescribed and hasn't missed any of her medications.    She's not scheduled to see you for a 6 month f/u until 12/14 @ 10:45 with BMET, TSH.    Please advise what you would like to do for patient?  She feels she should come in and be seen, yet she doesn't really have any complaints.

## 2015-12-16 NOTE — Telephone Encounter (Signed)
Spoke with patient to advise of instructions.  Patient states she will have to speak with the young lady who drives her to her appointments.  She'll do this tonight and she'll have to call us in the morning.  Advised patient I will go ahead and put her on the schedule for tomorrow and she can call us in the morning at 9:00 to confirm.  Advised patient she can come between 9:00 - 12:00 tomorrow for labs, weight check and BP check.  We will then wait on labs to return for further advisement from there.    Patient will call me at 9:00 in the morning.

## 2015-12-16 NOTE — Telephone Encounter (Signed)
Have [name] come for labs tomorrow. CBC with diff, C-met, TSH, BNP and weight and BP check. Do not have appt tomorrow. Will check these labs and go from there. 

## 2015-12-17 ENCOUNTER — Ambulatory Visit (INDEPENDENT_AMBULATORY_CARE_PROVIDER_SITE_OTHER): Payer: Medicare Other | Admitting: Internal Medicine

## 2015-12-17 ENCOUNTER — Encounter: Payer: Self-pay | Admitting: Internal Medicine

## 2015-12-17 VITALS — BP 122/80 | Wt 129.0 lb

## 2015-12-17 DIAGNOSIS — E039 Hypothyroidism, unspecified: Secondary | ICD-10-CM

## 2015-12-17 DIAGNOSIS — R5383 Other fatigue: Secondary | ICD-10-CM

## 2015-12-17 DIAGNOSIS — I10 Essential (primary) hypertension: Secondary | ICD-10-CM | POA: Diagnosis not present

## 2015-12-17 DIAGNOSIS — I5022 Chronic systolic (congestive) heart failure: Secondary | ICD-10-CM | POA: Diagnosis not present

## 2015-12-17 LAB — COMPREHENSIVE METABOLIC PANEL
ALK PHOS: 63 U/L (ref 33–130)
ALT: 9 U/L (ref 6–29)
AST: 13 U/L (ref 10–35)
Albumin: 3.7 g/dL (ref 3.6–5.1)
BUN: 24 mg/dL (ref 7–25)
CHLORIDE: 104 mmol/L (ref 98–110)
CO2: 26 mmol/L (ref 20–31)
CREATININE: 1.64 mg/dL — AB (ref 0.60–0.88)
Calcium: 9.2 mg/dL (ref 8.6–10.4)
Glucose, Bld: 77 mg/dL (ref 65–99)
Potassium: 4.3 mmol/L (ref 3.5–5.3)
SODIUM: 140 mmol/L (ref 135–146)
TOTAL PROTEIN: 6 g/dL — AB (ref 6.1–8.1)
Total Bilirubin: 0.6 mg/dL (ref 0.2–1.2)

## 2015-12-17 LAB — CBC WITH DIFFERENTIAL/PLATELET
BASOS ABS: 73 {cells}/uL (ref 0–200)
Basophils Relative: 1 %
EOS PCT: 6 %
Eosinophils Absolute: 438 cells/uL (ref 15–500)
HCT: 37.4 % (ref 35.0–45.0)
HEMOGLOBIN: 12.3 g/dL (ref 11.7–15.5)
LYMPHS PCT: 24 %
Lymphs Abs: 1752 cells/uL (ref 850–3900)
MCH: 31.6 pg (ref 27.0–33.0)
MCHC: 32.9 g/dL (ref 32.0–36.0)
MCV: 96.1 fL (ref 80.0–100.0)
MPV: 10.3 fL (ref 7.5–12.5)
Monocytes Absolute: 876 cells/uL (ref 200–950)
Monocytes Relative: 12 %
NEUTROS PCT: 57 %
Neutro Abs: 4161 cells/uL (ref 1500–7800)
Platelets: 230 10*3/uL (ref 140–400)
RBC: 3.89 MIL/uL (ref 3.80–5.10)
RDW: 14.3 % (ref 11.0–15.0)
WBC: 7.3 10*3/uL (ref 3.8–10.8)

## 2015-12-17 LAB — TSH: TSH: 1.5 m[IU]/L

## 2015-12-17 NOTE — Progress Notes (Signed)
Nurse visit. C/o of fatigue. Labs drawn and pending further instructions to follow.

## 2015-12-17 NOTE — Telephone Encounter (Signed)
Patient came this morning for labs.

## 2015-12-17 NOTE — Patient Instructions (Signed)
Labs drawn and pending

## 2015-12-18 LAB — BRAIN NATRIURETIC PEPTIDE: Brain Natriuretic Peptide: 399.8 pg/mL — ABNORMAL HIGH (ref <100)

## 2016-01-12 ENCOUNTER — Other Ambulatory Visit: Payer: Self-pay | Admitting: *Deleted

## 2016-01-12 MED ORDER — ADVAIR DISKUS 250-50 MCG/DOSE IN AEPB: 1.0000 | INHALATION_SPRAY | Freq: Two times a day (BID) | RESPIRATORY_TRACT | 3 refills | Status: DC

## 2016-01-21 ENCOUNTER — Ambulatory Visit
Admission: RE | Admit: 2016-01-21 | Discharge: 2016-01-21 | Disposition: A | Discharge status: Home / Self Care | Payer: Medicare Other | Source: Ambulatory Visit | Attending: Internal Medicine | Admitting: Internal Medicine

## 2016-01-21 ENCOUNTER — Ambulatory Visit (INDEPENDENT_AMBULATORY_CARE_PROVIDER_SITE_OTHER): Payer: Medicare Other | Admitting: Internal Medicine

## 2016-01-21 ENCOUNTER — Encounter: Payer: Self-pay | Admitting: Internal Medicine

## 2016-01-21 ENCOUNTER — Other Ambulatory Visit: Payer: Self-pay

## 2016-01-21 VITALS — BP 120/80 | HR 86 | Wt 129.5 lb

## 2016-01-21 DIAGNOSIS — Z87898 Personal history of other specified conditions: Secondary | ICD-10-CM

## 2016-01-21 DIAGNOSIS — I10 Essential (primary) hypertension: Secondary | ICD-10-CM

## 2016-01-21 DIAGNOSIS — N183 Chronic kidney disease, stage 3 unspecified: Secondary | ICD-10-CM

## 2016-01-21 DIAGNOSIS — R8290 Unspecified abnormal findings in urine: Secondary | ICD-10-CM

## 2016-01-21 DIAGNOSIS — Z130 Encounter for screening for diseases of the blood and blood-forming organs and certain disorders involving the immune mechanism: Secondary | ICD-10-CM

## 2016-01-21 DIAGNOSIS — I509 Heart failure, unspecified: Secondary | ICD-10-CM | POA: Diagnosis not present

## 2016-01-21 DIAGNOSIS — Z7901 Long term (current) use of anticoagulants: Secondary | ICD-10-CM | POA: Diagnosis not present

## 2016-01-21 DIAGNOSIS — Z950 Presence of cardiac pacemaker: Secondary | ICD-10-CM | POA: Diagnosis not present

## 2016-01-21 DIAGNOSIS — F411 Generalized anxiety disorder: Secondary | ICD-10-CM

## 2016-01-21 DIAGNOSIS — I5022 Chronic systolic (congestive) heart failure: Secondary | ICD-10-CM | POA: Diagnosis not present

## 2016-01-21 DIAGNOSIS — I480 Paroxysmal atrial fibrillation: Secondary | ICD-10-CM

## 2016-01-21 DIAGNOSIS — J90 Pleural effusion, not elsewhere classified: Secondary | ICD-10-CM | POA: Diagnosis not present

## 2016-01-21 DIAGNOSIS — I4891 Unspecified atrial fibrillation: Secondary | ICD-10-CM | POA: Diagnosis not present

## 2016-01-21 DIAGNOSIS — R5383 Other fatigue: Secondary | ICD-10-CM

## 2016-01-21 LAB — CBC WITH DIFFERENTIAL/PLATELET
Basophils Absolute: 68 {cells}/uL (ref 0–200)
Basophils Relative: 1 %
Eosinophils Absolute: 408 {cells}/uL (ref 15–500)
Eosinophils Relative: 6 %
HCT: 34.5 % — ABNORMAL LOW (ref 35.0–45.0)
Hemoglobin: 11.4 g/dL — ABNORMAL LOW (ref 11.7–15.5)
Lymphocytes Relative: 24 %
Lymphs Abs: 1632 {cells}/uL (ref 850–3900)
MCH: 32 pg (ref 27.0–33.0)
MCHC: 33 g/dL (ref 32.0–36.0)
MCV: 96.9 fL (ref 80.0–100.0)
MPV: 10 fL (ref 7.5–12.5)
Monocytes Absolute: 748 {cells}/uL (ref 200–950)
Monocytes Relative: 11 %
Neutro Abs: 3944 {cells}/uL (ref 1500–7800)
Neutrophils Relative %: 58 %
Platelets: 273 10*3/uL (ref 140–400)
RBC: 3.56 MIL/uL — ABNORMAL LOW (ref 3.80–5.10)
RDW: 13.7 % (ref 11.0–15.0)
WBC: 6.8 10*3/uL (ref 3.8–10.8)

## 2016-01-21 LAB — POC URINALSYSI DIPSTICK (AUTOMATED)
BILIRUBIN UA: NEGATIVE
GLUCOSE UA: NEGATIVE
KETONES UA: NEGATIVE
Nitrite, UA: NEGATIVE
Protein, UA: NEGATIVE
RBC UA: NEGATIVE
SPEC GRAV UA: 1.01
Urobilinogen, UA: NEGATIVE
pH, UA: 7

## 2016-01-21 LAB — FOLATE: Folate: >24 ng/mL (ref >5.4)

## 2016-01-21 LAB — VITAMIN B12: Vitamin B-12: 422 pg/mL (ref 200–1100)

## 2016-01-21 MED ORDER — CYANOCOBALAMIN 1000 MCG/ML IJ SOLN
1000.0000 ug | Freq: Once | INTRAMUSCULAR | Status: AC
Start: 2016-01-21 — End: 2016-01-21
  Administered 2016-01-21: 1000 ug via INTRAMUSCULAR

## 2016-01-21 NOTE — Progress Notes (Signed)
   Subjective:    Patient ID: [name], female    DOB: Oct 06, 1921, 82 y.o.   MRN: [id number]  HPI  82 year old Female with no energy. Takes MVI daily. Stayed in bed for past several days until late afternoon. No chest pain. No SOB. Did not walk to mailbox for several days. No headache.  Last saw Dr. Lovena Le re: pacer in February. Had similar complaint mid September but labs were OK except very slight elevation of BNP.  Today feels better.Doesn't know why she feels better today. Resides alone. Daughter is with her today. Discussion with daughter about patient residing alone. Daughter says she goes to see her every weekend and checks in by phone daily. No recent falls.    Review of Systems see above. No complaint of pain.     Objective:   Physical Exam Weight 129.5 lbs     weighed 129 pounds December 17, 2015  Skin warm and dry. Nodes none. Chest clear. Cardiac exam irregular rhythm. Extremities without edema. Abdomen is benign. She is alert and oriented.       Assessment & Plan:  Unexplained fatigue. Check labs including CBC, B-12, BNP. Has trace LE on urine dipstick. Culture sent. Folate level checked.  Mild CHF-based on elevated BNP  History of pacemaker  Anxiety  History of hypothyroidism-TSH checked in September was normal  Weight is stable  C met in September showed creatinine 1.64. Albumin was 3.7. Total protein 6.0. Liver functions were normal.  Addendum: Hemoglobin 11.4 g. BNP 510.5  Plan: Continue to monitor. Discussion with daughter about patient residing alone. She seems to think patient is doing okay. Apparently there is some company coming next week and patient has been a bit nervous and agitated about that but daughter says she's been helping her get cleaned up for the company. Patient is to call if symptoms persist.

## 2016-01-22 LAB — BRAIN NATRIURETIC PEPTIDE: Brain Natriuretic Peptide: 510.5 pg/mL — ABNORMAL HIGH (ref <100)

## 2016-01-23 LAB — URINE CULTURE: Organism ID, Bacteria: NO GROWTH

## 2016-01-31 ENCOUNTER — Encounter: Payer: Self-pay | Admitting: Internal Medicine

## 2016-01-31 ENCOUNTER — Ambulatory Visit (INDEPENDENT_AMBULATORY_CARE_PROVIDER_SITE_OTHER): Payer: Medicare Other | Admitting: Internal Medicine

## 2016-01-31 VITALS — BP 122/60 | HR 81 | Ht 63.0 in | Wt 129.8 lb

## 2016-01-31 DIAGNOSIS — I482 Chronic atrial fibrillation, unspecified: Secondary | ICD-10-CM

## 2016-01-31 DIAGNOSIS — Z950 Presence of cardiac pacemaker: Secondary | ICD-10-CM | POA: Diagnosis not present

## 2016-01-31 NOTE — Patient Instructions (Signed)
Lab work drawn and pending. Please call us symptoms persist.

## 2016-01-31 NOTE — Patient Instructions (Addendum)
Medication Instructions:  Your physician recommends that you continue on your current medications as directed. Please refer to the Current Medication list given to you today.   Labwork: None ordered   Testing/Procedures: None ordered   Follow-Up: Your physician wants you to follow-up in: 12 months with Dr. Lovena Le. You will receive a reminder letter in the mail two months in advance. If you don't receive a letter, please call our office to schedule the follow-up appointment.  Remote monitoring is used to monitor your Pacemaker from home. This monitoring reduces the number of office visits required to check your device to one time per year. It allows [place] to keep an eye on the functioning of your device to ensure it is working properly. You are scheduled for a device check from home on 05/01/2016. You [place] send your transmission at any time that day. If you have a wireless device, the transmission will be sent automatically. After your physician reviews your transmission, you will receive a postcard with your next transmission date.   Any Other Special Instructions Will Be Listed Below (If Applicable).     If you need a refill on your cardiac medications before your next appointment, please call your pharmacy.

## 2016-01-31 NOTE — Progress Notes (Signed)
HPI [name] returns today for followup. She is a very pleasant 82 year old woman with a history of symptomatic bradycardia, paroxysmal and now chronic atrial fibrillation, status post permanent pacemaker insertion. In the interim, she has been stable but has become more sedentary and notes generalized fatigue. She has gained 5 lbs, admitting that she is eating better. Her atrial fib dates back at least 2 years. No peripheral edema. No syncope. No bleeding or falling. No Known Allergies   Current Outpatient Prescriptions  Medication Sig Dispense Refill  . ADVAIR DISKUS 250-50 MCG/DOSE AEPB Inhale 1 puff into the lungs 2 (two) times daily. 60 each 3  . ALPRAZolam (XANAX) 0.5 MG tablet Take 0.5 mg by mouth 3 (three) times daily as needed (Take as directed).    Marland Kitchen apixaban (ELIQUIS) 2.5 MG TABS tablet Take 2.5 mg by mouth 2 (two) times daily. STOP WARFARIN    . Cholecalciferol (VITAMIN D3) 1000 UNITS CAPS Take 1,000 Units by mouth daily.     Marland Kitchen diltiazem (CARDIZEM CD) 120 MG 24 hr capsule TAKE ONE CAPSULE BY MOUTH EVERY DAY 30 capsule 3  . ferrous sulfate 325 (65 FE) MG tablet Take 325 mg by mouth daily with breakfast.      . furosemide (LASIX) 20 MG tablet TAKE 1 TABLET BY MOUTH EVERY DAY 30 tablet 11  . KLOR-CON M20 20 MEQ tablet TAKE 1 TABLET BY MOUTH EVERY DAY 30 tablet 11  . levothyroxine (SYNTHROID, LEVOTHROID) 88 MCG tablet TAKE 1 TABLET BY MOUTH EVERY DAY 90 tablet 3  . metoprolol (LOPRESSOR) 50 MG tablet TAKE 1 TABLET BY MOUTH TWICE A DAY 180 tablet 3  . Multiple Vitamins-Minerals (ICAPS) CAPS Take 1 capsule by mouth 2 (two) times daily.     . psyllium (METAMUCIL) 58.6 % powder Take 1 packet by mouth every evening.      No current facility-administered medications for this visit.      Past Medical History:  Diagnosis Date  . Anemia   . Asthma   . Atrial fibrillation ([place])   . Atrial flutter ([employer])    s/p RFCA  . Diverticulosis   . DJD (degenerative joint disease)    knees  .  Esophageal stricture   . GERD (gastroesophageal reflux disease)   . Hearing loss   . HTN (hypertension)    a.  echo 3/06: EF 55-65%, mild LVH, mild RVH;   b.  Echo (06/10/13): EF 50-55%, normal wall motion, MAC, mild MR, mild RPE, PASP 40.  Marland Kitchen Hypothyroidism   . Macular degeneration   . Renal insufficiency   . Symptomatic sinus bradycardia    s/p pacemaker  . Tremor, essential   . Urinary, incontinence, stress female   . Vertigo     ROS:   All systems reviewed and negative except as noted in the HPI.   Past Surgical History:  Procedure Laterality Date  . PACEMAKER INSERTION    . PERMANENT PACEMAKER GENERATOR CHANGE N/A 12/15/2013   Procedure: PERMANENT PACEMAKER GENERATOR CHANGE;  Surgeon: Evans Lance, MD;  Location: [facility] LAB;  Service: Cardiovascular;  Laterality: N/A;  . radiofrequency catheter ablation    . TRANSTHORACIC ECHOCARDIOGRAM  06/10/2013   EF 50-55% Mild MR     Family History  Problem Relation Age of Onset  . Cancer Mother     breast  . Cancer Father     leukemia  . Cirrhosis Brother     deceased     Social History   Social History  .  Marital status: Widowed    Spouse name: N/A  . Number of children: 2  . Years of education: N/A   Occupational History  . 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 Smoking status: Former Smoker    Types: Cigarettes    Quit date: 02/04/1962  . Smokeless tobacco: Never Used  . Alcohol use Yes     Comment: rarely  . Drug use: No  . Sexual activity: No   Other Topics Concern  . Not on file   Social History Narrative  . No narrative on file     BP 122/60   Pulse 81   Ht 5\' 3"  (1.6 m)   Wt 129 lb 12.8 oz (58.9 kg)   BMI 22.99 kg/m   Physical Exam:  Well appearing 82 year old woman, NAD HEENT: Unremarkable Neck:  6 cm JVD, no thyromegally Lungs:  Clear with no wheezes, rales, or rhonchi. PPM is well healed. HEART:  IRegular rate rhythm, no murmurs, no rubs, no clicks Abd:  soft,  positive bowel sounds, no organomegally, no rebound, no guarding Ext:  2 plus pulses, no edema, no cyanosis, no clubbing Skin:  No rashes no nodules Neuro:  CN II through XII intact, motor grossly intact  DEVICE  Normal device function.  See PaceArt for details.   Assess/Plan:  1. Atrial fib -her rate is controlled. She will continue her anti-coagulation 2. HTN - her blood pressure is controlled.  3. PM - her medtronic DDD PM is working normally. Will recheck in several months.   [name].D.

## 2016-02-07 LAB — CUP PACEART INCLINIC DEVICE CHECK
Battery Impedance: 177 Ohm
Brady Statistic RV Percent Paced: 32 %
Implantable Lead Location: 753859
Implantable Lead Location: 753860
Implantable Lead Model: 5076
Lead Channel Impedance Value: 478 Ohm
Lead Channel Impedance Value: 589 Ohm
Lead Channel Sensing Intrinsic Amplitude: 0.7 mV
Lead Channel Sensing Intrinsic Amplitude: 11.2 mV
Lead Channel Setting Pacing Amplitude: 2.5 V
MDC IDC LEAD IMPLANT DT: 20051017
MDC IDC LEAD IMPLANT DT: 20051017
MDC IDC MSMT BATTERY REMAINING LONGEVITY: 108 mo
MDC IDC MSMT BATTERY VOLTAGE: 2.8 V
MDC IDC MSMT LEADCHNL RV PACING THRESHOLD AMPLITUDE: 1.25 V
MDC IDC MSMT LEADCHNL RV PACING THRESHOLD PULSEWIDTH: 0.4 ms
MDC IDC PG IMPLANT DT: 20150914
MDC IDC SESS DTM: 20171030162523
MDC IDC SET LEADCHNL RV PACING PULSEWIDTH: 0.4 ms
MDC IDC SET LEADCHNL RV SENSING SENSITIVITY: 4 mV

## 2016-02-11 ENCOUNTER — Other Ambulatory Visit: Payer: Self-pay

## 2016-02-11 MED ORDER — APIXABAN 2.5 MG PO TABS: 2.5000 mg | ORAL_TABLET | Freq: Two times a day (BID) | ORAL | 0 refills | Status: DC

## 2016-02-14 ENCOUNTER — Telehealth: Payer: Self-pay | Admitting: Cardiology

## 2016-02-14 MED ORDER — APIXABAN 2.5 MG PO TABS: 2.5000 mg | ORAL_TABLET | Freq: Two times a day (BID) | ORAL | 0 refills | Status: DC

## 2016-02-14 NOTE — Telephone Encounter (Signed)
Patient called to let us know that we sent her refills to [facility] instead of [facility]. Please send to the mail order [facility] and call patient to let her know the status.

## 2016-02-14 NOTE — Telephone Encounter (Signed)
Pt called stating that her Rx for Eliquis 2.5 mg tablet had been sent to wrong pharmacy. I resent the Rx to the correct pharmacy as requested, confirmation received. I advised the pt that if she has any other problems, questions or concerns to call the office. Pt verbalized understanding.

## 2016-02-26 ENCOUNTER — Other Ambulatory Visit: Payer: Self-pay | Admitting: Internal Medicine

## 2016-03-09 ENCOUNTER — Other Ambulatory Visit: Payer: Self-pay

## 2016-03-10 ENCOUNTER — Other Ambulatory Visit: Payer: Self-pay | Admitting: Cardiology

## 2016-03-13 ENCOUNTER — Ambulatory Visit (INDEPENDENT_AMBULATORY_CARE_PROVIDER_SITE_OTHER): Payer: Medicare Other | Admitting: Internal Medicine

## 2016-03-13 ENCOUNTER — Encounter: Payer: Self-pay | Admitting: Internal Medicine

## 2016-03-13 VITALS — BP 116/72 | HR 57 | Temp 97.9°F | Ht 63.0 in | Wt 130.0 lb

## 2016-03-13 DIAGNOSIS — Z7901 Long term (current) use of anticoagulants: Secondary | ICD-10-CM | POA: Diagnosis not present

## 2016-03-13 DIAGNOSIS — H903 Sensorineural hearing loss, bilateral: Secondary | ICD-10-CM | POA: Diagnosis not present

## 2016-03-13 DIAGNOSIS — I480 Paroxysmal atrial fibrillation: Secondary | ICD-10-CM | POA: Diagnosis not present

## 2016-03-13 DIAGNOSIS — F411 Generalized anxiety disorder: Secondary | ICD-10-CM

## 2016-03-13 DIAGNOSIS — Z950 Presence of cardiac pacemaker: Secondary | ICD-10-CM

## 2016-03-13 DIAGNOSIS — H8110 Benign paroxysmal vertigo, unspecified ear: Secondary | ICD-10-CM | POA: Diagnosis not present

## 2016-03-13 DIAGNOSIS — Z8679 Personal history of other diseases of the circulatory system: Secondary | ICD-10-CM | POA: Diagnosis not present

## 2016-03-13 DIAGNOSIS — N183 Chronic kidney disease, stage 3 unspecified: Secondary | ICD-10-CM

## 2016-03-13 DIAGNOSIS — H3530 Unspecified macular degeneration: Secondary | ICD-10-CM | POA: Diagnosis not present

## 2016-03-13 DIAGNOSIS — E039 Hypothyroidism, unspecified: Secondary | ICD-10-CM

## 2016-03-13 DIAGNOSIS — I10 Essential (primary) hypertension: Secondary | ICD-10-CM | POA: Diagnosis not present

## 2016-03-13 LAB — BASIC METABOLIC PANEL
BUN: 26 mg/dL — AB (ref 7–25)
CHLORIDE: 105 mmol/L (ref 98–110)
CO2: 27 mmol/L (ref 20–31)
Calcium: 9.3 mg/dL (ref 8.6–10.4)
Creat: 1.46 mg/dL — ABNORMAL HIGH (ref 0.60–0.88)
GLUCOSE: 83 mg/dL (ref 65–99)
POTASSIUM: 4.6 mmol/L (ref 3.5–5.3)
SODIUM: 138 mmol/L (ref 135–146)

## 2016-03-13 LAB — TSH: TSH: 2.28 mIU/L

## 2016-03-16 ENCOUNTER — Ambulatory Visit: Payer: Medicare Other | Admitting: Internal Medicine

## 2016-04-02 NOTE — Progress Notes (Signed)
   Subjective:    Patient ID: [name], female    DOB: [birth year], 82 y.o.   MRN: [id number]  HPI  82 year old Female in today for six-month follow-up. History of hypothyroidism and hypertension. History of atrial fibrillation on chronic anticoagulation. She resides alone. Daughter checks in on her. She has a pacemaker. History of acute diastolic heart failure in 2015 precipitated by atrial fibrillation with rapid ventricular response. Pacemaker was placed for sick sinus syndrome in 2005. She no longer drives.  History of chronic kidney disease, hearing loss in both ears, osteoarthritis, essential tremor.  Right now she feels pretty well. She is looking forward to going to [place] for the Christmas holidays to be with her son and his family. She will be flying down there. She notes to stay for several weeks.  History of vague malaise and fatigue at times. Also history of benign positional vertigo    Review of Systems see above     Objective:   Physical Exam Neck is supple without JVD or thyromegaly. Chest clear. Cardiac exam: Irregular rhythm. Extremities without edema.       Assessment & Plan:  Essential hypertension  Hypothyroidism-TSH stable at 2.28  History of pacemaker  History of Atrial fibrillation with rapid ventricular response on chronic anticoagulation  History of benign positional vertigo  Anxiety  Congestive heart failure with mildly elevated BNP in October of 510.5  Chronic kidney disease-creatinine is 1.46 and previously was 1.64 when checked 3 months ago  Plan: Patient seems to be doing well at the present time. Return in 6 months for physical examination.

## 2016-04-02 NOTE — Patient Instructions (Addendum)
It was a pleasure to see today. Please have a good time in [place] was safe travels during the holidays. Return in 6 months. Continue same medications.

## 2016-05-01 ENCOUNTER — Encounter: Payer: Medicare Other | Admitting: *Deleted

## 2016-05-01 ENCOUNTER — Telehealth: Payer: Self-pay | Admitting: Cardiology

## 2016-05-01 NOTE — Telephone Encounter (Signed)
LMOVM reminding pt to send remote transmission.   

## 2016-05-04 ENCOUNTER — Encounter: Payer: Self-pay | Admitting: Cardiology

## 2016-05-09 ENCOUNTER — Ambulatory Visit (INDEPENDENT_AMBULATORY_CARE_PROVIDER_SITE_OTHER): Payer: Medicare Other | Admitting: *Deleted

## 2016-05-09 DIAGNOSIS — I495 Sick sinus syndrome: Secondary | ICD-10-CM

## 2016-05-11 NOTE — Progress Notes (Signed)
Remote pacemaker transmission.   

## 2016-05-12 ENCOUNTER — Encounter: Payer: Self-pay | Admitting: Cardiology

## 2016-05-12 LAB — CUP PACEART REMOTE DEVICE CHECK
Battery Impedance: 202 Ohm
Brady Statistic RV Percent Paced: 18 %
Implantable Lead Implant Date: 20051017
Implantable Lead Location: 753860
Lead Channel Impedance Value: 451 Ohm
Lead Channel Impedance Value: 67 Ohm
Lead Channel Setting Pacing Amplitude: 2.5 V
Lead Channel Setting Sensing Sensitivity: 4 mV
MDC IDC LEAD IMPLANT DT: 20051017
MDC IDC LEAD LOCATION: 753859
MDC IDC MSMT BATTERY REMAINING LONGEVITY: 125 mo
MDC IDC MSMT BATTERY VOLTAGE: 2.8 V
MDC IDC MSMT LEADCHNL RV PACING THRESHOLD AMPLITUDE: 1.125 V
MDC IDC MSMT LEADCHNL RV PACING THRESHOLD PULSEWIDTH: 0.4 ms
MDC IDC PG IMPLANT DT: 20150914
MDC IDC SESS DTM: 20180206193059
MDC IDC SET LEADCHNL RV PACING PULSEWIDTH: 0.4 ms

## 2016-05-15 ENCOUNTER — Telehealth: Payer: Self-pay | Admitting: Cardiology

## 2016-05-15 ENCOUNTER — Other Ambulatory Visit: Payer: Self-pay | Admitting: Cardiology

## 2016-05-15 MED ORDER — APIXABAN 2.5 MG PO TABS: 2.5000 mg | ORAL_TABLET | Freq: Two times a day (BID) | ORAL | 0 refills | Status: DC

## 2016-05-15 NOTE — Telephone Encounter (Signed)
New Message  Patient calling the office for samples of medication:   1.  What medication and dosage are you requesting samples for? Eliquis 2.5mg    2.  Are you currently out of this medication? Per pt son has a week worth. The mail in order has been order but pt will run out of medications before the new prescription will arrive. Please call back to discuss

## 2016-05-15 NOTE — Telephone Encounter (Signed)
F/U Patient's son (reed Selke) is calling back to make sure we have correct contact number [phone]. Thanks.

## 2016-05-15 NOTE — Telephone Encounter (Signed)
close

## 2016-05-15 NOTE — Telephone Encounter (Signed)
No samples of eliquis 2.5mg  available  Attempted to contact [facility] refill room and there was no answer Notified patient's son that our office does not have samples He states local pharmacy can provider short supply of medication  Rx(s) sent to pharmacy electronically.

## 2016-06-23 ENCOUNTER — Other Ambulatory Visit: Payer: Self-pay | Admitting: Internal Medicine

## 2016-07-06 ENCOUNTER — Other Ambulatory Visit: Payer: Self-pay | Admitting: Internal Medicine

## 2016-07-06 NOTE — Telephone Encounter (Signed)
Refill x 6 months 

## 2016-08-04 DIAGNOSIS — H524 Presbyopia: Secondary | ICD-10-CM | POA: Diagnosis not present

## 2016-08-04 DIAGNOSIS — H04123 Dry eye syndrome of bilateral lacrimal glands: Secondary | ICD-10-CM | POA: Diagnosis not present

## 2016-08-04 DIAGNOSIS — H353132 Nonexudative age-related macular degeneration, bilateral, intermediate dry stage: Secondary | ICD-10-CM | POA: Diagnosis not present

## 2016-08-09 ENCOUNTER — Ambulatory Visit (INDEPENDENT_AMBULATORY_CARE_PROVIDER_SITE_OTHER): Payer: Medicare Other | Admitting: *Deleted

## 2016-08-09 ENCOUNTER — Telehealth: Payer: Self-pay | Admitting: Cardiology

## 2016-08-09 DIAGNOSIS — I495 Sick sinus syndrome: Secondary | ICD-10-CM

## 2016-08-09 NOTE — Telephone Encounter (Signed)
LMOVM reminding pt to send remote transmission.   

## 2016-08-09 NOTE — Progress Notes (Signed)
Remote pacemaker transmission.   

## 2016-08-11 ENCOUNTER — Encounter: Payer: Self-pay | Admitting: Cardiology

## 2016-08-11 LAB — CUP PACEART REMOTE DEVICE CHECK
Battery Impedance: 227 Ohm
Battery Remaining Longevity: 122 mo
Brady Statistic RV Percent Paced: 17 %
Implantable Lead Implant Date: 20051017
Implantable Lead Implant Date: 20051017
Implantable Lead Location: 753859
Implantable Lead Model: 5076
Implantable Lead Model: 5076
Lead Channel Sensing Intrinsic Amplitude: 8 mV
Lead Channel Setting Pacing Amplitude: 2.5 V
Lead Channel Setting Pacing Pulse Width: 0.4 ms
MDC IDC LEAD LOCATION: 753860
MDC IDC MSMT BATTERY VOLTAGE: 2.8 V
MDC IDC MSMT LEADCHNL RA IMPEDANCE VALUE: 67 Ohm
MDC IDC MSMT LEADCHNL RV IMPEDANCE VALUE: 495 Ohm
MDC IDC MSMT LEADCHNL RV PACING THRESHOLD AMPLITUDE: 1 V
MDC IDC MSMT LEADCHNL RV PACING THRESHOLD PULSEWIDTH: 0.4 ms
MDC IDC PG IMPLANT DT: 20150914
MDC IDC SESS DTM: 20180509182524
MDC IDC SET LEADCHNL RV SENSING SENSITIVITY: 4 mV

## 2016-08-25 ENCOUNTER — Telehealth: Payer: Self-pay | Admitting: Cardiology

## 2016-08-25 NOTE — Telephone Encounter (Signed)
Patient calling states that she has moved to [place] and would like to know your recommendations for a cardiologist in our [place] office. Patient states that she would prefer you call Tuesday.Thanks.

## 2016-08-29 NOTE — Telephone Encounter (Signed)
Spoke with pt, aware she can see dr Stanford Breed in the [place] office. Location and availability of the schedule discussed. She will discuss with her dtr and call back to schedule follow up

## 2016-10-05 ENCOUNTER — Other Ambulatory Visit: Payer: Self-pay | Admitting: Cardiology

## 2016-11-02 ENCOUNTER — Other Ambulatory Visit: Payer: Self-pay | Admitting: Cardiology

## 2016-11-08 ENCOUNTER — Ambulatory Visit (INDEPENDENT_AMBULATORY_CARE_PROVIDER_SITE_OTHER): Payer: Medicare Other | Admitting: *Deleted

## 2016-11-08 DIAGNOSIS — I495 Sick sinus syndrome: Secondary | ICD-10-CM

## 2016-11-09 ENCOUNTER — Encounter: Payer: Self-pay | Admitting: Cardiology

## 2016-11-09 NOTE — Progress Notes (Signed)
Remote pacemaker transmission.   

## 2016-11-20 NOTE — Progress Notes (Signed)
HPI The patient presents for follow up.  Since I last saw her she has done very well.  The patient denies any new symptoms such as chest discomfort, neck or arm discomfort. There has been no new shortness of breath, PND or orthopnea. There have been no reported palpitations, presyncope or syncope.  She does limited activity with bad knees but gets around to do some light shopping and walks with a cane.   No Known Allergies  Current Outpatient Prescriptions  Medication Sig Dispense Refill  . ADVAIR DISKUS 250-50 MCG/DOSE AEPB Inhale 1 puff into the lungs 2 (two) times daily. 60 each 3  . ALPRAZolam (XANAX) 0.5 MG tablet TAKE 1 TABLET BY MOUTH 3 TIMES A DAY AS NEEDED 90 tablet 5  . apixaban (ELIQUIS) 2.5 MG TABS tablet Take 1 tablet (2.5 mg total) by mouth 2 (two) times daily. 180 tablet 1  . Cholecalciferol (VITAMIN D3) 1000 UNITS CAPS Take 1,000 Units by mouth daily.     Marland Kitchen diltiazem (CARDIZEM CD) 120 MG 24 hr capsule TAKE ONE CAPSULE BY MOUTH EVERY DAY 30 capsule 10  . ferrous sulfate 325 (65 FE) MG tablet Take 325 mg by mouth daily with breakfast.      . furosemide (LASIX) 20 MG tablet TAKE 1 TABLET BY MOUTH EVERY DAY 30 tablet 1  . KLOR-CON M20 20 MEQ tablet TAKE 1 TABLET BY MOUTH EVERY DAY 30 tablet 2  . levothyroxine (SYNTHROID, LEVOTHROID) 88 MCG tablet TAKE 1 TABLET BY MOUTH EVERY DAY 90 tablet 3  . metoprolol (LOPRESSOR) 50 MG tablet TAKE 1 TABLET BY MOUTH TWICE A DAY 180 tablet 1  . Multiple Vitamins-Minerals (PRESERVISION AREDS 2) CAPS Take 2 capsules by mouth daily.     No current facility-administered medications for this visit.     Past Medical History:  Diagnosis Date  . Anemia   . Asthma   . Atrial fibrillation ([place])   . Atrial flutter ([place])    s/p RFCA  . Diverticulosis   . DJD (degenerative joint disease)    knees  . Esophageal stricture   . GERD (gastroesophageal reflux disease)   . Hearing loss   . HTN (hypertension)    a.  echo 3/06: EF 55-65%, mild LVH,  mild RVH;   b.  Echo (06/10/13): EF 50-55%, normal wall motion, MAC, mild MR, mild RPE, PASP 40.  Marland Kitchen Hypothyroidism   . Macular degeneration   . Renal insufficiency   . Symptomatic sinus bradycardia    s/p pacemaker  . Tremor, essential   . Urinary, incontinence, stress female   . Vertigo     Past Surgical History:  Procedure Laterality Date  . PACEMAKER INSERTION    . PERMANENT PACEMAKER GENERATOR CHANGE N/A 12/15/2013   Procedure: PERMANENT PACEMAKER GENERATOR CHANGE;  Surgeon: Evans Lance, MD;  Location[facility] C[facility] LAB;  Service: Cardiovascular;  Laterality: N/A;  . radiofrequency catheter ablation    . TRANSTHORACIC ECHOCARDIOGRAM  06/10/2013   EF 50-55% Mild MR    ROS:  As stated in the HPI and negative for all other systems.  PHYSICAL EXAM BP 134/72   Pulse 84   Ht _0  (1.6 m)   Wt 130 lb 12.8 oz (59.3 kg)   BMI 23.17 kg/m  GENERAL:  Well appearing and looks great for her stated age.  NECK:  No jugular venous distention, waveform within normal limits, carotid upstroke brisk and symmetric, no bruits, no thyromegaly LUNGS:  Clear to auscultation bilaterally CHEST:  Unremarkable HEART:  PMI not displaced or sustained,S1 and S2 within normal limits, no S3, no clicks, no rubs, no murmurs, irregular ABD:  Flat, positive bowel sounds normal in frequency in pitch, no bruits, no rebound, no guarding, no midline pulsatile mass, no hepatomegaly, no splenomegaly EXT:  2 plus pulses throughout, no edema, no cyanosis no clubbing   EKG:  Atrial fibrillation rate 84, no acute ST-T wave changes.  PVCs. 11/22/2016  ASSESSMENT AND PLAN  ATRIAL FIBRILLATION -  [name] has a CHA2DS2 - VASc score of 4 with a risk of stroke of 4%.   No change in therapy is indicated.  I will check a CBC and BMET.    CARDIAC PACEMAKER IN SITU -  She is up to date with follow up.       HYPERTENSION -  The blood pressure is at target. No change in medications is indicated. We will continue  with therapeutic lifestyle changes (TLC).

## 2016-11-21 ENCOUNTER — Ambulatory Visit (INDEPENDENT_AMBULATORY_CARE_PROVIDER_SITE_OTHER): Payer: Medicare Other | Admitting: Cardiology

## 2016-11-21 ENCOUNTER — Encounter: Payer: Self-pay | Admitting: Cardiology

## 2016-11-21 VITALS — BP 134/72 | HR 84 | Ht 63.0 in | Wt 130.8 lb

## 2016-11-21 DIAGNOSIS — Z79899 Other long term (current) drug therapy: Secondary | ICD-10-CM | POA: Diagnosis not present

## 2016-11-21 DIAGNOSIS — I10 Essential (primary) hypertension: Secondary | ICD-10-CM | POA: Diagnosis not present

## 2016-11-21 DIAGNOSIS — I482 Chronic atrial fibrillation, unspecified: Secondary | ICD-10-CM

## 2016-11-21 NOTE — Patient Instructions (Signed)
Medication Instructions:  Your physician recommends that you continue on your current medications as directed. Please refer to the Current Medication list given to you today. If you need a refill on your cardiac medications before your next appointment, please call your pharmacy.  Labwork: CBC AND BMET IN [place], [place], [place], [place] [address] [phone] Follow-Up: Your physician wants you to follow-up in: [place] should receive a reminder letter in the mail two months in advance. If you do not receive a letter, please call our office June 2019 to schedule the AUGUST 2019 follow-up appointment.  Thank you for choosing [facility] at [facility]!!

## 2016-11-22 ENCOUNTER — Encounter: Payer: Self-pay | Admitting: Cardiology

## 2016-11-23 NOTE — Addendum Note (Signed)
Addended by: Vennie Homans on: 11/23/2016 08:12 AM   Modules accepted: Orders

## 2016-11-26 LAB — CUP PACEART REMOTE DEVICE CHECK
Date Time Interrogation Session: 20180808161846
Implantable Lead Implant Date: 20051017
Implantable Lead Location: 753860
Implantable Lead Model: 5076
Implantable Pulse Generator Implant Date: 20150914
Lead Channel Impedance Value: 512 Ohm
Lead Channel Impedance Value: 67 Ohm
Lead Channel Pacing Threshold Amplitude: 1 V
Lead Channel Pacing Threshold Pulse Width: 0.4 ms
Lead Channel Setting Pacing Amplitude: 2.5 V
MDC IDC LEAD IMPLANT DT: 20051017
MDC IDC LEAD LOCATION: 753859
MDC IDC MSMT BATTERY IMPEDANCE: 227 Ohm
MDC IDC MSMT BATTERY REMAINING LONGEVITY: 121 mo
MDC IDC MSMT BATTERY VOLTAGE: 2.79 V
MDC IDC SET LEADCHNL RV PACING PULSEWIDTH: 0.4 ms
MDC IDC SET LEADCHNL RV SENSING SENSITIVITY: 5.6 mV
MDC IDC STAT BRADY RV PERCENT PACED: 17 %

## 2016-11-30 ENCOUNTER — Other Ambulatory Visit: Payer: Self-pay | Admitting: Cardiology

## 2016-12-06 DIAGNOSIS — S20211A Contusion of right front wall of thorax, initial encounter: Secondary | ICD-10-CM | POA: Diagnosis not present

## 2016-12-06 DIAGNOSIS — W1830XA Fall on same level, unspecified, initial encounter: Secondary | ICD-10-CM | POA: Diagnosis not present

## 2016-12-06 DIAGNOSIS — W19XXXA Unspecified fall, initial encounter: Secondary | ICD-10-CM | POA: Diagnosis not present

## 2016-12-06 DIAGNOSIS — S299XXA Unspecified injury of thorax, initial encounter: Secondary | ICD-10-CM | POA: Diagnosis not present

## 2016-12-06 DIAGNOSIS — R0781 Pleurodynia: Secondary | ICD-10-CM | POA: Diagnosis not present

## 2016-12-12 ENCOUNTER — Other Ambulatory Visit: Payer: Self-pay | Admitting: Cardiology

## 2016-12-12 ENCOUNTER — Telehealth: Payer: Self-pay | Admitting: Internal Medicine

## 2016-12-12 NOTE — Telephone Encounter (Signed)
Called the daughter, Curt Bears back.  Advised of Dr. Verlene Mayer instructions.  Daughter advised that patient states she is feeling some better.  And, she's not going to go to the Urgent Care.  And, she's now living with the daughter in [place].  So, IF she were to need to be seen, she would go to [facility] [place].  Asked if patient still wanted to be seen on Thursday at 12:00?  Daughter advised to cancel that appointment as well.  Advised that if her Mother needed to be seen, she would get her somewhere there in [place] close by to be seen.    Daughter did thank [place] for the call back to her.

## 2016-12-12 NOTE — Telephone Encounter (Signed)
She should go to Urgent Care at [facility]

## 2016-12-12 NOTE — Telephone Encounter (Signed)
Daughter, Curt Bears is calling.  States they were on vacation about a week ago and she took a hard fall.  She took her to an ED on 9/5 in [place] where they were on vacation.  They just returned home yesterday from [place].  She fell inside and hit her side on the arm of a chair.  The ED did x-rays and released her.  They didn't find anything broken.  They told them that she had bruised ribs and pulled muscles.  They advised her to take Tylenol and to use ice and heat.    She is saying that Calene is "wanting to come and be seen" today.  I gave her an appointment earlier for Thursday at 12:00 and she was going to see how she did for the next few days.  She's now calling back and saying that her Mom is so uncomfortable that she feels she needs to be seen prior to Thursday.  She wants to know if she can be worked in today?  Or should she go to the ED?    Best # to call back:  [phone] Curt Bears)

## 2016-12-22 ENCOUNTER — Ambulatory Visit (INDEPENDENT_AMBULATORY_CARE_PROVIDER_SITE_OTHER): Payer: Medicare Other | Admitting: Internal Medicine

## 2016-12-22 ENCOUNTER — Encounter (HOSPITAL_COMMUNITY): Payer: Self-pay | Admitting: *Deleted

## 2016-12-22 ENCOUNTER — Emergency Department (HOSPITAL_COMMUNITY): Payer: Medicare Other

## 2016-12-22 ENCOUNTER — Ambulatory Visit
Admission: RE | Admit: 2016-12-22 | Discharge: 2016-12-22 | Disposition: A | Discharge status: Home / Self Care | Payer: Medicare Other | Source: Ambulatory Visit | Attending: Internal Medicine | Admitting: Internal Medicine

## 2016-12-22 ENCOUNTER — Observation Stay (HOSPITAL_COMMUNITY)
Admission: EM | Admit: 2016-12-22 | Discharge: 2016-12-23 | Disposition: A | Discharge status: Home / Self Care | Payer: Medicare Other | Attending: Family Medicine | Admitting: Family Medicine

## 2016-12-22 ENCOUNTER — Encounter: Payer: Self-pay | Admitting: Internal Medicine

## 2016-12-22 ENCOUNTER — Observation Stay (HOSPITAL_COMMUNITY): Payer: Medicare Other

## 2016-12-22 VITALS — BP 104/58 | HR 90 | Temp 97.4°F | Wt 128.0 lb

## 2016-12-22 DIAGNOSIS — N183 Chronic kidney disease, stage 3 unspecified: Secondary | ICD-10-CM | POA: Diagnosis present

## 2016-12-22 DIAGNOSIS — S2231XA Fracture of one rib, right side, initial encounter for closed fracture: Secondary | ICD-10-CM | POA: Diagnosis present

## 2016-12-22 DIAGNOSIS — H3530 Unspecified macular degeneration: Secondary | ICD-10-CM | POA: Diagnosis not present

## 2016-12-22 DIAGNOSIS — K219 Gastro-esophageal reflux disease without esophagitis: Secondary | ICD-10-CM | POA: Diagnosis not present

## 2016-12-22 DIAGNOSIS — I700 Atherosclerosis of aorta: Secondary | ICD-10-CM | POA: Diagnosis not present

## 2016-12-22 DIAGNOSIS — G250 Essential tremor: Secondary | ICD-10-CM | POA: Diagnosis not present

## 2016-12-22 DIAGNOSIS — R079 Chest pain, unspecified: Secondary | ICD-10-CM | POA: Diagnosis not present

## 2016-12-22 DIAGNOSIS — R0602 Shortness of breath: Secondary | ICD-10-CM

## 2016-12-22 DIAGNOSIS — S2241XD Multiple fractures of ribs, right side, subsequent encounter for fracture with routine healing: Secondary | ICD-10-CM | POA: Diagnosis not present

## 2016-12-22 DIAGNOSIS — Z950 Presence of cardiac pacemaker: Secondary | ICD-10-CM

## 2016-12-22 DIAGNOSIS — R0902 Hypoxemia: Secondary | ICD-10-CM

## 2016-12-22 DIAGNOSIS — J9601 Acute respiratory failure with hypoxia: Secondary | ICD-10-CM | POA: Diagnosis not present

## 2016-12-22 DIAGNOSIS — R000 Tachycardia, unspecified: Secondary | ICD-10-CM | POA: Diagnosis not present

## 2016-12-22 DIAGNOSIS — N393 Stress incontinence (female) (male): Secondary | ICD-10-CM | POA: Diagnosis not present

## 2016-12-22 DIAGNOSIS — Z87891 Personal history of nicotine dependence: Secondary | ICD-10-CM | POA: Insufficient documentation

## 2016-12-22 DIAGNOSIS — M545 Low back pain: Secondary | ICD-10-CM | POA: Diagnosis not present

## 2016-12-22 DIAGNOSIS — Z7901 Long term (current) use of anticoagulants: Secondary | ICD-10-CM | POA: Diagnosis not present

## 2016-12-22 DIAGNOSIS — H9193 Unspecified hearing loss, bilateral: Secondary | ICD-10-CM | POA: Diagnosis present

## 2016-12-22 DIAGNOSIS — E039 Hypothyroidism, unspecified: Secondary | ICD-10-CM

## 2016-12-22 DIAGNOSIS — Z79899 Other long term (current) drug therapy: Secondary | ICD-10-CM | POA: Insufficient documentation

## 2016-12-22 DIAGNOSIS — I4891 Unspecified atrial fibrillation: Secondary | ICD-10-CM | POA: Diagnosis not present

## 2016-12-22 DIAGNOSIS — S2241XA Multiple fractures of ribs, right side, initial encounter for closed fracture: Secondary | ICD-10-CM | POA: Diagnosis not present

## 2016-12-22 DIAGNOSIS — I5032 Chronic diastolic (congestive) heart failure: Secondary | ICD-10-CM | POA: Insufficient documentation

## 2016-12-22 DIAGNOSIS — I130 Hypertensive heart and chronic kidney disease with heart failure and stage 1 through stage 4 chronic kidney disease, or unspecified chronic kidney disease: Secondary | ICD-10-CM | POA: Diagnosis not present

## 2016-12-22 DIAGNOSIS — W19XXXD Unspecified fall, subsequent encounter: Secondary | ICD-10-CM | POA: Diagnosis not present

## 2016-12-22 DIAGNOSIS — J90 Pleural effusion, not elsewhere classified: Secondary | ICD-10-CM | POA: Diagnosis not present

## 2016-12-22 DIAGNOSIS — R5383 Other fatigue: Secondary | ICD-10-CM

## 2016-12-22 DIAGNOSIS — S2239XA Fracture of one rib, unspecified side, initial encounter for closed fracture: Secondary | ICD-10-CM

## 2016-12-22 DIAGNOSIS — Z95810 Presence of automatic (implantable) cardiac defibrillator: Secondary | ICD-10-CM | POA: Diagnosis not present

## 2016-12-22 DIAGNOSIS — R0609 Other forms of dyspnea: Secondary | ICD-10-CM | POA: Diagnosis present

## 2016-12-22 DIAGNOSIS — I10 Essential (primary) hypertension: Secondary | ICD-10-CM | POA: Diagnosis present

## 2016-12-22 DIAGNOSIS — R0789 Other chest pain: Secondary | ICD-10-CM | POA: Diagnosis not present

## 2016-12-22 DIAGNOSIS — D649 Anemia, unspecified: Secondary | ICD-10-CM | POA: Diagnosis present

## 2016-12-22 DIAGNOSIS — S299XXA Unspecified injury of thorax, initial encounter: Secondary | ICD-10-CM | POA: Diagnosis not present

## 2016-12-22 DIAGNOSIS — D631 Anemia in chronic kidney disease: Secondary | ICD-10-CM | POA: Diagnosis not present

## 2016-12-22 LAB — URINALYSIS, ROUTINE W REFLEX MICROSCOPIC
BILIRUBIN URINE: NEGATIVE
GLUCOSE, UA: NEGATIVE mg/dL
HGB URINE DIPSTICK: NEGATIVE
KETONES UR: 5 mg/dL — AB
Leukocytes, UA: NEGATIVE
Nitrite: NEGATIVE
PROTEIN: NEGATIVE mg/dL
Specific Gravity, Urine: 1.008 (ref 1.005–1.030)
pH: 8 (ref 5.0–8.0)

## 2016-12-22 LAB — BASIC METABOLIC PANEL WITH GFR
Anion gap: 10 (ref 5–15)
BUN: 24 mg/dL — ABNORMAL HIGH (ref 6–20)
CO2: 21 mmol/L — ABNORMAL LOW (ref 22–32)
Calcium: 9.5 mg/dL (ref 8.9–10.3)
Chloride: 106 mmol/L (ref 101–111)
Creatinine, Ser: 1.59 mg/dL — ABNORMAL HIGH (ref 0.44–1.00)
GFR calc Af Amer: 31 mL/min — ABNORMAL LOW
GFR calc non Af Amer: 27 mL/min — ABNORMAL LOW
Glucose, Bld: 103 mg/dL — ABNORMAL HIGH (ref 65–99)
Potassium: 4.5 mmol/L (ref 3.5–5.1)
Sodium: 137 mmol/L (ref 135–145)

## 2016-12-22 LAB — CBC
HCT: 33.9 % — ABNORMAL LOW (ref 36.0–46.0)
Hemoglobin: 11.3 g/dL — ABNORMAL LOW (ref 12.0–15.0)
MCH: 31.4 pg (ref 26.0–34.0)
MCHC: 33.3 g/dL (ref 30.0–36.0)
MCV: 94.2 fL (ref 78.0–100.0)
Platelets: 279 K/uL (ref 150–400)
RBC: 3.6 MIL/uL — ABNORMAL LOW (ref 3.87–5.11)
RDW: 13.7 % (ref 11.5–15.5)
WBC: 8.3 K/uL (ref 4.0–10.5)

## 2016-12-22 LAB — BRAIN NATRIURETIC PEPTIDE: B Natriuretic Peptide: 379.5 pg/mL — ABNORMAL HIGH (ref 0.0–100.0)

## 2016-12-22 LAB — I-STAT TROPONIN, ED: Troponin i, poc: 0 ng/mL (ref 0.00–0.08)

## 2016-12-22 MED ORDER — HYDROCODONE-ACETAMINOPHEN 5-325 MG PO TABS: 1.0000 | ORAL_TABLET | ORAL | Status: DC | PRN

## 2016-12-22 MED ORDER — ALPRAZOLAM 0.5 MG PO TABS
0.5000 mg | ORAL_TABLET | Freq: Every evening | ORAL | Status: DC | PRN
  Administered 2016-12-23: 0.5 mg via ORAL
  Filled 2016-12-22: qty 1

## 2016-12-22 MED ORDER — VITAMIN D 1000 UNITS PO TABS
1000.0000 [IU] | ORAL_TABLET | Freq: Every day | ORAL | Status: DC
  Administered 2016-12-23: 1000 [IU] via ORAL
  Filled 2016-12-22: qty 1

## 2016-12-22 MED ORDER — SENNOSIDES-DOCUSATE SODIUM 8.6-50 MG PO TABS: 1.0000 | ORAL_TABLET | Freq: Every evening | ORAL | Status: DC | PRN

## 2016-12-22 MED ORDER — FERROUS SULFATE 325 (65 FE) MG PO TABS
325.0000 mg | ORAL_TABLET | Freq: Every day | ORAL | Status: DC
  Administered 2016-12-23: 325 mg via ORAL
  Filled 2016-12-22: qty 1

## 2016-12-22 MED ORDER — MORPHINE SULFATE (PF) 4 MG/ML IV SOLN
1.0000 mg | INTRAVENOUS | Status: DC | PRN
  Administered 2016-12-22: 1 mg via INTRAVENOUS
  Filled 2016-12-22: qty 1

## 2016-12-22 MED ORDER — METOPROLOL TARTRATE 50 MG PO TABS
50.0000 mg | ORAL_TABLET | Freq: Two times a day (BID) | ORAL | Status: DC
Start: 2016-12-23 — End: 2016-12-23
  Administered 2016-12-23: 50 mg via ORAL
  Filled 2016-12-22: qty 1

## 2016-12-22 MED ORDER — DILTIAZEM HCL ER COATED BEADS 120 MG PO CP24
120.0000 mg | ORAL_CAPSULE | Freq: Every day | ORAL | Status: DC
  Administered 2016-12-23: 120 mg via ORAL
  Filled 2016-12-22 (×2): qty 1

## 2016-12-22 MED ORDER — ONDANSETRON HCL 4 MG PO TABS: 4.0000 mg | ORAL_TABLET | Freq: Four times a day (QID) | ORAL | Status: DC | PRN

## 2016-12-22 MED ORDER — APIXABAN 2.5 MG PO TABS
2.5000 mg | ORAL_TABLET | Freq: Two times a day (BID) | ORAL | Status: DC
  Administered 2016-12-23: 2.5 mg via ORAL
  Filled 2016-12-22 (×2): qty 1

## 2016-12-22 MED ORDER — SODIUM CHLORIDE 0.9 % IV SOLN
INTRAVENOUS | Status: DC
  Administered 2016-12-22: 19:00:00 via INTRAVENOUS

## 2016-12-22 MED ORDER — VITAMIN D3 25 MCG (1000 UT) PO CAPS: 1000.0000 [IU] | ORAL_CAPSULE | Freq: Every day | ORAL | Status: DC

## 2016-12-22 MED ORDER — MOMETASONE FURO-FORMOTEROL FUM 200-5 MCG/ACT IN AERO
2.0000 | INHALATION_SPRAY | Freq: Two times a day (BID) | RESPIRATORY_TRACT | Status: DC
  Administered 2016-12-23: 2 via RESPIRATORY_TRACT
  Filled 2016-12-22: qty 8.8

## 2016-12-22 MED ORDER — ACETAMINOPHEN 325 MG PO TABS: 650.0000 mg | ORAL_TABLET | Freq: Four times a day (QID) | ORAL | Status: DC | PRN

## 2016-12-22 MED ORDER — BISACODYL 10 MG RE SUPP: 10.0000 mg | Freq: Every day | RECTAL | Status: DC | PRN

## 2016-12-22 MED ORDER — LEVOTHYROXINE SODIUM 88 MCG PO TABS
88.0000 ug | ORAL_TABLET | Freq: Every day | ORAL | Status: DC
  Administered 2016-12-23: 88 ug via ORAL
  Filled 2016-12-22: qty 1

## 2016-12-22 MED ORDER — FUROSEMIDE 20 MG PO TABS
20.0000 mg | ORAL_TABLET | Freq: Every day | ORAL | Status: DC
  Administered 2016-12-23: 20 mg via ORAL
  Filled 2016-12-22: qty 1

## 2016-12-22 MED ORDER — ONDANSETRON HCL 4 MG/2ML IJ SOLN: 4.0000 mg | Freq: Four times a day (QID) | INTRAMUSCULAR | Status: DC | PRN

## 2016-12-22 MED ORDER — ACETAMINOPHEN 650 MG RE SUPP: 650.0000 mg | Freq: Four times a day (QID) | RECTAL | Status: DC | PRN

## 2016-12-22 MED ORDER — SODIUM CHLORIDE 0.9 % IV BOLUS (SEPSIS): 500.0000 mL | Freq: Once | INTRAVENOUS | Status: DC

## 2016-12-22 MED ORDER — POLYETHYL GLYCOL-PROPYL GLYCOL 0.4-0.3 % OP GEL: Freq: Every day | OPHTHALMIC | Status: DC | PRN

## 2016-12-22 MED ORDER — TECHNETIUM TO 99M ALBUMIN AGGREGATED
4.3700 | Freq: Once | INTRAVENOUS | Status: AC | PRN
  Administered 2016-12-22: 4.37 via INTRAVENOUS

## 2016-12-22 MED ORDER — TECHNETIUM TC 99M DIETHYLENETRIAME-PENTAACETIC ACID
32.0000 | Freq: Once | INTRAVENOUS | Status: AC | PRN
  Administered 2016-12-22: 32 via RESPIRATORY_TRACT

## 2016-12-22 MED ORDER — POTASSIUM CHLORIDE CRYS ER 20 MEQ PO TBCR
20.0000 meq | EXTENDED_RELEASE_TABLET | Freq: Every day | ORAL | Status: DC
  Administered 2016-12-23: 20 meq via ORAL
  Filled 2016-12-22: qty 1

## 2016-12-22 NOTE — ED Notes (Signed)
Called Lab advised they will add on BNP

## 2016-12-22 NOTE — Patient Instructions (Signed)
Daughter to transport to the emergency department for further evaluation of shortness of breath. Has been diagnosed with 2 right rib fractures that are nondisplaced

## 2016-12-22 NOTE — Progress Notes (Signed)
   Subjective:    Patient ID: [name], female    DOB: 09-13-1921, 83 y.o.   MRN: [id number]  HPI 83 year old Female was visiting family in [place], [place] on September 5. She fell tripping over a chair and was taken to the emergency department complaining of right rib pain and chest pain. Rib detail films obtained that day in the emergency department interpretation did not think she had rib fractures. She was diagnosed with rib contusion/chest wall pain and discharged home.  She moved from her home into an apartment  for older adults in [place] about a month ago according to her daughter who accompanies her today. Patient is feeling short of breath and complaining of fatigue but is vague about the fatigue. Seems to be short of breath with exertion.  Chest x-ray obtained today as an outpatient shows small bilateral pleural effusions. However rib detail films showed fracture of right 10th and 11th ribs better not displaced.  She cannot verbalize exactly what is bothering her other than some shortness of breath and some rib pain.  I am concerned she Zeiner be in congestive heart failure. I'm asking daughter to take her to the emergency department for further evaluation.  She has a history of pacemaker and history of atrial fibrillation. She is followed by Dr. Percival Spanish. She has a history of hypothyroidism. She has a history of chronic kidney disease and has been hospitalized with congestive heart failure previously.   Review of Systems see above     Objective:   Physical Exam Her chest is clear. Cardiac exam irregular irregular rhythm. Extremities without edema. She is tender right lateral rib cage area       Assessment & Plan:  Fatigue-etiology unclear  Chronic atrial fibrillation  Pacemaker  Prior history of congestive heart failure-needs BNP and other labs including CBC and TSH  History of hypothyroidism  Chronic anticoagulation  Fractured right 10th and 11th ribs  nondisplaced secondary to fall on September 5  Plan: Patient advised to present to the emergency department for further evaluation.

## 2016-12-22 NOTE — H&P (Signed)
History and Physical    [name] MRN:[id number] DOB: [birth year] DOA: 12/22/2016   PCP: Elby Showers, MD   Patient coming from:  Home    Chief Complaint: Shortness of breath and rib pain  HPI: [name] is a 83 y.o. female with medical history significant for atrial fibrillation on chronic anticoagulation with Eliquis, history of pacemaker placement, hypothyroidism, asthma, anemia, CK D stage III, brought to the ED for evaluation of increasing shortness of breath. The patient was visiting her family in [place] on September 5, at which time, she sustained a mechanical fall to her right area. She was diagnosed with rib contusion and chest wall pain, and discharged home. After returning to her apartment for older adults in [place], she began feeling short of breath, worse on exertion, and complaining of fatigue.  SHe denies any fever, shills or sick contacts. She denied pleuritic chest pain. She denies any hemoptysis. She denies any chest pain or palpitations. She denied any nausea, vomiting or diaphoresis. She also reported increased pain in her mid back. She denies any lower extremity swelling. No confusion is reported. She denies any vision changes, or headaches.  ED Course:  BP (!) 122/56   Pulse (!) 36   Temp (!) 97.3 F (36.3 C) (Oral)   Resp (!) 25   SpO2 99%   O2 sats were 84% in room air, in prove with 2 L of oxygen and nasal cannula to the mid 90s. Due to low O2 sats, V/Q scan was ordered, which results are currently pending. Chest x-rayUnchanged small bilateral pleural effusions and bibasilar atelectasis/scarring. Ribs x-ray Nondisplaced fractures of the anterior right tenth and eleventh ribs  Labs: Remarkable for creatinine 1.59, at baseline BNP 379.5 (likely reactive) Troponin 0 Hemoglobin 11.3, white count 8.3, platelets 279 Last 2-D echo in 2015 shows EF 50-55% with normal systolic. The patient has a history of atrial fibrillation. .  Review of  Systems:  As per HPI otherwise all other systems reviewed and are negative  Past Medical History:  Diagnosis Date  . Anemia   . Asthma   . Atrial fibrillation ([place])   . Atrial flutter ([place])    s/p RFCA  . Diverticulosis   . DJD (degenerative joint disease)    knees  . Esophageal stricture   . GERD (gastroesophageal reflux disease)   . Hearing loss   . HTN (hypertension)    a.  echo 3/06: EF 55-65%, mild LVH, mild RVH;   b.  Echo (06/10/13): EF 50-55%, normal wall motion, MAC, mild MR, mild RPE, PASP 40.  Marland Kitchen Hypothyroidism   . Macular degeneration   . Renal insufficiency   . Symptomatic sinus bradycardia    s/p pacemaker  . Tremor, essential   . Urinary, incontinence, stress female   . Vertigo     Past Surgical History:  Procedure Laterality Date  . PACEMAKER INSERTION    . PERMANENT PACEMAKER GENERATOR CHANGE N/A 12/15/2013   Procedure: PERMANENT PACEMAKER GENERATOR CHANGE;  Surgeon: Evans Lance, MD;  Location: [facility] CATH LAB;  Service: Cardiovascular;  Laterality: N/A;  . radiofrequency catheter ablation    . TRANSTHORACIC ECHOCARDIOGRAM  06/10/2013   EF 50-55% Mild MR    Social History Social History   Social History  . Marital status: Widowed    Spouse name: N/A  . Number of children: 2  . Years of education: N/A   Occupational History  . retired Careers information officer    Social History Main  Topics  . Smoking status: Former Smoker    Types: Cigarettes    Quit date: 02/04/1962  . Smokeless tobacco: Never Used  . Alcohol use Yes     Comment: rarely  . Drug use: No  . Sexual activity: No   Other Topics Concern  . Not on file   Social History Narrative  . No narrative on file     No Known Allergies  Family History  Problem Relation Age of Onset  . Cancer Mother        breast  . Cancer Father        leukemia  . Cirrhosis Brother        deceased      Prior to Admission medications   Medication Sig Start Date End Date Taking? Authorizing Provider  ADVAIR  DISKUS 250-50 MCG/DOSE AEPB Inhale 1 puff into the lungs 2 (two) times daily. 01/12/16   Elby Showers, MD  ALPRAZolam Duanne Moron) 0.5 MG tablet TAKE 1 TABLET BY MOUTH 3 TIMES A DAY AS NEEDED 07/06/16   Elby Showers, MD  apixaban (ELIQUIS) 2.5 MG TABS tablet Take 1 tablet (2.5 mg total) by mouth 2 (two) times daily. 05/16/16   Minus Breeding, MD  Cholecalciferol (VITAMIN D3) 1000 UNITS CAPS Take 1,000 Units by mouth daily.     [provider]  diltiazem (CARDIZEM CD) 120 MG 24 hr capsule TAKE ONE CAPSULE BY MOUTH EVERY DAY 03/10/16   Evans Lance, MD  ferrous sulfate 325 (65 FE) MG tablet Take 325 mg by mouth daily with breakfast.      [provider]  furosemide (LASIX) 20 MG tablet TAKE 1 TABLET BY MOUTH EVERY DAY 11/30/16   Minus Breeding, MD  KLOR-CON M20 20 MEQ tablet TAKE 1 TABLET BY MOUTH EVERY DAY 11/02/16   Minus Breeding, MD  levothyroxine (SYNTHROID, LEVOTHROID) 88 MCG tablet TAKE 1 TABLET BY MOUTH EVERY DAY 02/26/16   Elby Showers, MD  metoprolol (LOPRESSOR) 50 MG tablet TAKE 1 TABLET BY MOUTH TWICE A DAY 06/23/16   Evans Lance, MD  Multiple Vitamins-Minerals (PRESERVISION AREDS 2) CAPS Take 2 capsules by mouth daily.    [provider]    Physical Exam:  Vitals:   12/22/16 1332 12/22/16 1351 12/22/16 1430  BP: 114/72 132/79 (!) 122/56  Pulse: (!) 118 70 (!) 36  Resp: (!) 28 (!) 25 (!) 25  Temp: (!) 97.3 F (36.3 C)    TempSrc: Oral    SpO2: (!) 89% (!) 76% 99%   Constitutional: NAD, calm,He will appearing, mildly anxious.  Eyes: PERRL, lids and conjunctivae normal. Known macular degeneration ENMT: Mucous membranes are moist, without exudate or lesions  Neck: normal, supple, no masses, no thyromegaly Respiratory: Essentially clear to auscultation bilaterally, no wheezing, no crackles. She has known atelectatic sounds. Normal respiratory effort  Cardiovascular: IRRr, no murmur, rubs or gallops. No extremity edema. 2+ pedal pulses. No carotid  bruits.  Abdomen: Soft, non tender, No hepatosplenomegaly. Bowel sounds positive.  Musculoskeletal: no clubbing / cyanosis. Moves all extremities. Right posterior lower chest wall tenderness, without deformity or crepitus. Skin: no jaundice, No lesions.  Neurologic: Sensation intact  Strength equal in all extremities. Known essential tremor and HOH  Psychiatric:   Alert and oriented x 3. Mildly anxiousmood.     Labs on Admission: I have personally reviewed following labs and imaging studies  CBC:  Recent Labs Lab 12/22/16 1334  WBC 8.3  HGB 11.3*  HCT 33.9*  MCV 94.2  PLT 629    Basic Metabolic Panel:  Recent Labs Lab 12/22/16 1334  NA 137  K 4.5  CL 106  CO2 21*  GLUCOSE 103*  BUN 24*  CREATININE 1.59*  CALCIUM 9.5    GFR: Estimated Creatinine Clearance: 18.7 mL/min (A) (by C-G formula based on SCr of 1.59 mg/dL (H)).  Liver Function Tests: No results for input(s): AST, ALT, ALKPHOS, BILITOT, PROT, ALBUMIN in the last 168 hours. No results for input(s): LIPASE, AMYLASE in the last 168 hours. No results for input(s): AMMONIA in the last 168 hours.  Coagulation Profile: No results for input(s): INR, PROTIME in the last 168 hours.  Cardiac Enzymes: No results for input(s): CKTOTAL, CKMB, CKMBINDEX, TROPONINI in the last 168 hours.  BNP (last 3 results) No results for input(s): PROBNP in the last 8760 hours.  HbA1C: No results for input(s): HGBA1C in the last 72 hours.  CBG: No results for input(s): GLUCAP in the last 168 hours.  Lipid Profile: No results for input(s): CHOL, HDL, LDLCALC, TRIG, CHOLHDL, LDLDIRECT in the last 72 hours.  Thyroid Function Tests: No results for input(s): TSH, T4TOTAL, FREET4, T3FREE, THYROIDAB in the last 72 hours.  Anemia Panel: No results for input(s): VITAMINB12, FOLATE, FERRITIN, TIBC, IRON, RETICCTPCT in the last 72 hours.  Urine analysis:    Component Value Date/Time   COLORURINE YELLOW 09/15/2010 [place] 09/15/2010 1458   LABSPEC 1.007 09/15/2010 1458   PHURINE 8.0 09/15/2010 1458   GLUCOSEU NEGATIVE 09/15/2010 1458   HGBUR NEGATIVE 09/15/2010 1458   BILIRUBINUR NEG 01/21/2016 1257   KETONESUR NEGATIVE 09/15/2010 1458   PROTEINUR NEG 01/21/2016 1257   PROTEINUR NEGATIVE 09/15/2010 1458   UROBILINOGEN negative 01/21/2016 1257   UROBILINOGEN 0.2 09/15/2010 1458   NITRITE NEG 01/21/2016 1257   NITRITE NEGATIVE 09/15/2010 1458   LEUKOCYTESUR Trace (A) 01/21/2016 1257    Sepsis Labs: _0 (procalcitonin:4,lacticidven:4) )No results found for this or any previous visit (from the past 240 hour(s)).   Radiological Exams on Admission: Dg Chest 2 View  Result Date: 12/22/2016 CLINICAL DATA:  Shortness of breath. EXAM: CHEST  2 VIEW COMPARISON:  Chest x-ray from earlier today. FINDINGS: Left chest wall AICD in unchanged position. The cardiomediastinal silhouette is normal in size. Normal pulmonary vascularity. Atherosclerotic calcification of the aortic arch. Bibasilar atelectasis/scarring and small bilateral pleural effusions again noted. IMPRESSION: Unchanged small bilateral pleural effusions and bibasilar atelectasis/scarring. Electronically Signed   By: Titus Dubin M.D.   On: 12/22/2016 14:56   Dg Chest 2 View  Result Date: 12/22/2016 CLINICAL DATA:  [place] 2 weeks ago. Right-sided chest pain and shortness of breath. EXAM: CHEST  2 VIEW COMPARISON:  01/21/2016 FINDINGS: Heart size is normal. Chronic aortic atherosclerosis. Dual lead pacemaker appears unchanged. There are small bilateral pleural effusions. Mild basilar pulmonary scarring. No pneumothorax. No visible rib or spinal fracture on these images. IMPRESSION: No traumatic finding. Aortic atherosclerosis. Small pleural effusions and mild basilar atelectasis or scarring. Electronically Signed   By: Nelson Chimes M.D.   On: 12/22/2016 12:45   Dg Ribs Unilateral Right  Result Date: 12/22/2016 CLINICAL DATA:  [place] 2  weeks ago.  Right-sided rib pain. EXAM: RIGHT RIBS - 2 VIEW COMPARISON:  Chest radiography same day FINDINGS: Two views show nondisplaced fractures of the anterior tenth and eleventh ribs. Otherwise are ribs appear normal. IMPRESSION: Nondisplaced fractures of the anterior right tenth and eleventh ribs. Electronically Signed   By: Jan Fireman.D.  On: 12/22/2016 12:47    EKG: Independently reviewed.  Assessment/Plan Active Problems:   Rib fracture   Essential hypertension   GERD   CARDIAC PACEMAKER IN SITU- MDT 2005   Long term current use of anticoagulant   Benign essential tremor   SUI (stress urinary incontinence, female)   Hypothyroidism   Hearing loss of both ears   Anemia   Chronic renal insufficiency, stage III (moderate)   Dyspnea on exertion   Macular degeneration   Atrial fibrillation (HCC)   Right rib fracture     Lower back pain  likely due to  Nondisplaced fractures of the anterior right tenth and eleventh ribs.  Received  IV pain meds .  Continue supportive therapy  No surgical intervention is indicated at this time Continue vitamin D3 supplements  Acute respiratory failure with initial hypoxia  , likely to poor inspiratory effort due to right rib fracture as above, in the setting of known atelectasis and atrial fibrillation, as well as osteoporosis. CXR confirms known small pleural effusion without changes from prior. Initial O2 sats were 84% in room air, with improvement with 2 L of nasal cannula. VQ scan ordered by EDP, low suspiciou for PE though, as she is compliant with anticoagulation  Obtain daily weights Monitor intake and output Continue home meds with Cardizem and Eliquis now O2 prn  Chronic Diastolic CHF: BNP is 945, in the setting of O2 requirement No acute decompensation weight 128 lbs. Last 2 D echo 2015 EF 50-55% with normal systolic.  EKG without acute changes. Tn neg  Continue Lasix,and Lopressor Obtain daily weights Monitor intake and  output   Anemia of chronic disease Hemoglobin on admission 11.3 at baseline no bleeding issues noted. Repeat CBC in am  No transfusion is indicated at this time Continue iron supplements  Hypothyroidism:  Continue home Synthroid   Chronic kidney disease stage 3  creatinine 1.59, at baseline Lab Results  Component Value Date   CREATININE 1.59 (H) 12/22/2016   CREATININE 1.46 (H) 03/13/2016   CREATININE 1.64 (H) 12/17/2015  IVF Repeat CMET in am      DVT prophylaxis:  Eliquis  Code Status:  Full   Family Communication:  Discussed with patient Disposition Plan: Expect patient to be discharged to home after condition improves Consults called:     Admission status:    Rondel Jumbo, PA-C [facility]   12/22/2016, 4:15 PM

## 2016-12-22 NOTE — ED Notes (Signed)
Pt to VQ scan.

## 2016-12-22 NOTE — ED Notes (Signed)
Patient in Xray

## 2016-12-22 NOTE — ED Provider Notes (Signed)
[facility] DEPT Provider Note   CSN: [id number] Arrival date & time: 12/22/16  1320     History   Chief Complaint Chief Complaint  Patient presents with  . Shortness of Breath    HPI [name] is a 83 y.o. female.  She presents for evaluation of right mid back pain, shortness of breath, and "not feeling well."  Her daughter took her to her PCP today for the same complaints.  The patient reported fall 3 weeks ago, while visiting in [place].  X-ray done today as an outpatient revealed new right lower rib fractures 10 and 11.  The patient lives in an assisted living facility.  She is here with her daughter.  She denies chest pain, nausea or vomiting.  She has had gradually worsening shortness of breath for the last several days.  The breathing trouble is worst with attempts at ambulation even a short distance.  She has a history of a pacemaker but no known prior episodes of congestive heart failure.  HPI  Past Medical History:  Diagnosis Date  . Anemia   . Asthma   . Atrial fibrillation ([place])   . Atrial flutter ([place])    s/p RFCA  . Diverticulosis   . DJD (degenerative joint disease)    knees  . Esophageal stricture   . GERD (gastroesophageal reflux disease)   . Hearing loss   . HTN (hypertension)    a.  echo 3/06: EF 55-65%, mild LVH, mild RVH;   b.  Echo (06/10/13): EF 50-55%, normal wall motion, MAC, mild MR, mild RPE, PASP 40.  Marland Kitchen Hypothyroidism   . Macular degeneration   . Renal insufficiency   . Symptomatic sinus bradycardia    s/p pacemaker  . Tremor, essential   . Urinary, incontinence, stress female   . Vertigo     Patient Active Problem List   Diagnosis Date Noted  . Atrial fibrillation ([place]) 05/05/2015  . Acute diastolic heart failure ([place]) 06/16/2013    Class: Hospitalized for  . Pneumonia, organism unspecified; Question of 06/16/2013    Class: Question of  . Dyspnea on exertion 06/09/2013  . Acute diastolic congestive heart failure ([place])  06/09/2013  . Hypertensive cardiovascular disease 06/09/2013  . Macular degeneration 06/09/2013  . Palpitations 05/13/2013  . Chronic renal insufficiency, stage III (moderate) 08/30/2012  . SUI (stress urinary incontinence, female) 11/29/2010  . Hypothyroidism 11/29/2010  . Hearing loss of both ears 11/29/2010  . Anemia 11/29/2010  . Benign positional vertigo 09/30/2010  . Primary osteoarthritis of both knees 09/30/2010  . Benign essential tremor 09/30/2010  . Long term current use of anticoagulant 06/01/2010  . BRADYCARDIA 12/15/2008  . CARDIAC PACEMAKER IN SITU- MDT 2005 12/15/2008  . Essential hypertension 09/17/2008  . ASTHMA 09/17/2008  . GERD 09/17/2008    Past Surgical History:  Procedure Laterality Date  . PACEMAKER INSERTION    . PERMANENT PACEMAKER GENERATOR CHANGE N/A 12/15/2013   Procedure: PERMANENT PACEMAKER GENERATOR CHANGE;  Surgeon: Evans Lance, MD;  Location: [facility] CATH LAB;  Service: Cardiovascular;  Laterality: N/A;  . radiofrequency catheter ablation    . TRANSTHORACIC ECHOCARDIOGRAM  06/10/2013   EF 50-55% Mild MR    OB History    No data available       Home Medications    Prior to Admission medications   Medication Sig Start Date End Date Taking? Authorizing Provider  ADVAIR DISKUS 250-50 MCG/DOSE AEPB Inhale 1 puff into the lungs 2 (two) times daily. 01/12/16  Elby Showers, MD  ALPRAZolam Duanne Moron) 0.5 MG tablet TAKE 1 TABLET BY MOUTH 3 TIMES A DAY AS NEEDED 07/06/16   Elby Showers, MD  apixaban (ELIQUIS) 2.5 MG TABS tablet Take 1 tablet (2.5 mg total) by mouth 2 (two) times daily. 05/16/16   Minus Breeding, MD  Cholecalciferol (VITAMIN D3) 1000 UNITS CAPS Take 1,000 Units by mouth daily.     [provider]  diltiazem (CARDIZEM CD) 120 MG 24 hr capsule TAKE ONE CAPSULE BY MOUTH EVERY DAY 03/10/16   Evans Lance, MD  ferrous sulfate 325 (65 FE) MG tablet Take 325 mg by mouth daily with breakfast.      [provider]    furosemide (LASIX) 20 MG tablet TAKE 1 TABLET BY MOUTH EVERY DAY 11/30/16   Minus Breeding, MD  KLOR-CON M20 20 MEQ tablet TAKE 1 TABLET BY MOUTH EVERY DAY 11/02/16   Minus Breeding, MD  levothyroxine (SYNTHROID, LEVOTHROID) 88 MCG tablet TAKE 1 TABLET BY MOUTH EVERY DAY 02/26/16   Elby Showers, MD  metoprolol (LOPRESSOR) 50 MG tablet TAKE 1 TABLET BY MOUTH TWICE A DAY 06/23/16   Evans Lance, MD  Multiple Vitamins-Minerals (PRESERVISION AREDS 2) CAPS Take 2 capsules by mouth daily.    [provider]    Family History Family History  Problem Relation Age of Onset  . Cancer Mother        breast  . Cancer Father        leukemia  . Cirrhosis Brother        deceased    Social History Social History  Substance Use Topics  . Smoking status: Former Smoker    Types: Cigarettes    Quit date: 02/04/1962  . Smokeless tobacco: Never Used  . Alcohol use Yes     Comment: rarely     Allergies   Patient has no known allergies.   Review of Systems Review of Systems  All other systems reviewed and are negative.    Physical Exam Updated Vital Signs BP (!) 122/56   Pulse (!) 36   Temp (!) 97.3 F (36.3 C) (Oral)   Resp (!) 25   SpO2 99%   Physical Exam  Constitutional: She is oriented to person, place, and time. She appears well-developed. No distress.  Elderly, frail  HENT:  Head: Normocephalic and atraumatic.  Eyes: Pupils are equal, round, and reactive to light. Conjunctivae and EOM are normal.  Neck: Normal range of motion and phonation normal. Neck supple.  Cardiovascular: Normal rate and regular rhythm.   Pulmonary/Chest: Effort normal and breath sounds normal. No respiratory distress. She has no wheezes. She has no rales. She exhibits no tenderness (Right lower posterior chest wall tenderness, mild without crepitation or deformity).  Abdominal: Soft. She exhibits no distension. There is no tenderness. There is no guarding.  Musculoskeletal: Normal range of  motion. She exhibits no edema, tenderness or deformity.  Neurological: She is alert and oriented to person, place, and time. She exhibits normal muscle tone.  Skin: Skin is warm and dry.  Psychiatric: She has a normal mood and affect. Her behavior is normal. Judgment and thought content normal.  Nursing note and vitals reviewed.    ED Treatments / Results  Labs (all labs ordered are listed, but only abnormal results are displayed) Labs Reviewed  BASIC METABOLIC PANEL - Abnormal; Notable for the following:       Result Value   CO2 21 (*)    Glucose,  Bld 103 (*)    BUN 24 (*)    Creatinine, Ser 1.59 (*)    GFR calc non Af Amer 27 (*)    GFR calc Af Amer 31 (*)    All other components within normal limits  CBC - Abnormal; Notable for the following:    RBC 3.60 (*)    Hemoglobin 11.3 (*)    HCT 33.9 (*)    All other components within normal limits  BRAIN NATRIURETIC PEPTIDE - Abnormal; Notable for the following:    B Natriuretic Peptide 379.5 (*)    All other components within normal limits  I-STAT TROPONIN, ED    EKG  EKG Interpretation  Date/Time:  Friday December 22 2016 13:26:17 EDT Ventricular Rate:  113 PR Interval:    QRS Duration: 92 QT Interval:  336 QTC Calculation: 460 R Axis:   2 Text Interpretation:  Atrial fibrillation with rapid ventricular response Septal infarct , age undetermined Abnormal ECG Since last tracing rate faster Confirmed by Daleen Bo [phone]) on 12/22/2016 1:58:47 PM       Radiology Dg Chest 2 View  Result Date: 12/22/2016 CLINICAL DATA:  [place] 2 weeks ago. Right-sided chest pain and shortness of breath. EXAM: CHEST  2 VIEW COMPARISON:  01/21/2016 FINDINGS: Heart size is normal. Chronic aortic atherosclerosis. Dual lead pacemaker appears unchanged. There are small bilateral pleural effusions. Mild basilar pulmonary scarring. No pneumothorax. No visible rib or spinal fracture on these images. IMPRESSION: No traumatic finding. Aortic  atherosclerosis. Small pleural effusions and mild basilar atelectasis or scarring. Electronically Signed   By: Nelson Chimes M.D.   On: 12/22/2016 12:45   Dg Ribs Unilateral Right  Result Date: 12/22/2016 CLINICAL DATA:  [place] 2 weeks ago.  Right-sided rib pain. EXAM: RIGHT RIBS - 2 VIEW COMPARISON:  Chest radiography same day FINDINGS: Two views show nondisplaced fractures of the anterior tenth and eleventh ribs. Otherwise are ribs appear normal. IMPRESSION: Nondisplaced fractures of the anterior right tenth and eleventh ribs. Electronically Signed   By: Nelson Chimes M.D.   On: 12/22/2016 12:47    Procedures Procedures (including critical care time)  Medications Ordered in ED Medications  sodium chloride 0.9 % bolus 500 mL (not administered)     Initial Impression / Assessment and Plan / ED Course  I have reviewed the triage vital signs and the nursing notes.  Pertinent labs & imaging results that were available during my care of the patient were reviewed by me and considered in my medical decision making (see chart for details).  Clinical Course as of Dec 22 1500  Fri Dec 22, 2016  1418 Patient with known prior atrial fibrillation presenting with rib injuries, pleural effusions, and shortness of breath.  Concern for pulmonary injury, pulmonary embolus, and infection.  Doubt congestive heart failure.  She is tachycardic so will give fluid bolus then reassess.  [EW]  1419 Oxygen saturation on arrival low, 89% on room air, placed on nasal cannula oxygen with improvement, to greater than 94%.  [EW]  1500 DG Chest 2 View [EW]    Clinical Course User Index [EW] Daleen Bo, MD     Patient Vitals for the past 24 hrs:  BP Temp Temp src Pulse Resp SpO2  12/22/16 1430 (!) 122/56 - - (!) 36 (!) 25 99 %  12/22/16 1351 132/79 - - 70 (!) 25 (!) 76 %  12/22/16 1332 114/72 (!) 97.3 F (36.3 C) Oral (!) 118 (!) 28 (!) 89 %  2:56 PM Reevaluation with update and discussion. After initial  assessment and treatment, an updated evaluation reveals no change in clinical status.  Findings discussed with patient and daughter, all questions answered. [name]   3:00 PM-Consult complete with Hospitalist. Patient case explained and discussed.  She agrees to admit patient for further evaluation and treatment. Call ended at 57: [place] Performed by: Daleen Bo Silva Total critical care time: 30 minutes Critical care time was exclusive of separately billable procedures and treating other patients. Critical care was necessary to treat or prevent imminent or life-threatening deterioration. Critical care was time spent personally by me on the following activities: development of treatment plan with patient and/or surrogate as well as nursing, discussions with consultants, evaluation of patient's response to treatment, examination of patient, obtaining history from patient or surrogate, ordering and performing treatments and interventions, ordering and review of laboratory studies, ordering and review of radiographic studies, pulse oximetry and re-evaluation of patient's condition.   Final Clinical Impressions(s) / ED Diagnoses   Final diagnoses:  Hypoxia  Closed fracture of multiple ribs of right side with routine healing, subsequent encounter  Pleural effusion   Shortness of breath with hypoxia, atrial fibrillation and rapid ventricular response, with chest wall injury and fractured ribs.  Nonspecific pleural effusions, present on imaging from 2017, possibly traumatic related versus fluid overload.  Patient has an overall picture of volume depletion therefore I doubt acute congestive heart failure.  BNP is slightly elevated.  Doubt ACS, metabolic instability or impending vascular collapse.  Nursing Notes Reviewed/ Care Coordinated Applicable Imaging Reviewed Interpretation of Laboratory Data incorporated into ED treatment   Plan: Admit  New Prescriptions New  Prescriptions   No medications on file     Daleen Bo, MD 12/22/16 [phone]

## 2016-12-22 NOTE — ED Notes (Signed)
Awaiting medications to be verified 

## 2016-12-22 NOTE — ED Triage Notes (Signed)
Pt sent here from Dr. Verlene Mayer office with sob at rest and exertion.  Just diagnosed with 2 rib fractures and fluid on lungs, sob, since falling 2 weeks ago.

## 2016-12-23 ENCOUNTER — Other Ambulatory Visit: Payer: Self-pay | Admitting: Internal Medicine

## 2016-12-23 ENCOUNTER — Observation Stay (HOSPITAL_COMMUNITY): Payer: Medicare Other

## 2016-12-23 DIAGNOSIS — G250 Essential tremor: Secondary | ICD-10-CM | POA: Diagnosis not present

## 2016-12-23 DIAGNOSIS — I482 Chronic atrial fibrillation: Secondary | ICD-10-CM | POA: Diagnosis not present

## 2016-12-23 DIAGNOSIS — R0602 Shortness of breath: Secondary | ICD-10-CM | POA: Diagnosis not present

## 2016-12-23 DIAGNOSIS — N183 Chronic kidney disease, stage 3 (moderate): Secondary | ICD-10-CM

## 2016-12-23 DIAGNOSIS — S2241XD Multiple fractures of ribs, right side, subsequent encounter for fracture with routine healing: Secondary | ICD-10-CM | POA: Diagnosis not present

## 2016-12-23 DIAGNOSIS — D631 Anemia in chronic kidney disease: Secondary | ICD-10-CM | POA: Diagnosis not present

## 2016-12-23 DIAGNOSIS — N393 Stress incontinence (female) (male): Secondary | ICD-10-CM | POA: Diagnosis not present

## 2016-12-23 DIAGNOSIS — I700 Atherosclerosis of aorta: Secondary | ICD-10-CM | POA: Diagnosis not present

## 2016-12-23 DIAGNOSIS — I130 Hypertensive heart and chronic kidney disease with heart failure and stage 1 through stage 4 chronic kidney disease, or unspecified chronic kidney disease: Secondary | ICD-10-CM | POA: Diagnosis not present

## 2016-12-23 DIAGNOSIS — K219 Gastro-esophageal reflux disease without esophagitis: Secondary | ICD-10-CM | POA: Diagnosis not present

## 2016-12-23 DIAGNOSIS — J9601 Acute respiratory failure with hypoxia: Secondary | ICD-10-CM | POA: Diagnosis not present

## 2016-12-23 DIAGNOSIS — I4891 Unspecified atrial fibrillation: Secondary | ICD-10-CM | POA: Diagnosis not present

## 2016-12-23 DIAGNOSIS — I5032 Chronic diastolic (congestive) heart failure: Secondary | ICD-10-CM | POA: Diagnosis not present

## 2016-12-23 DIAGNOSIS — E039 Hypothyroidism, unspecified: Secondary | ICD-10-CM | POA: Diagnosis not present

## 2016-12-23 DIAGNOSIS — M545 Low back pain: Secondary | ICD-10-CM | POA: Diagnosis not present

## 2016-12-23 LAB — CBC
HEMATOCRIT: 34.1 % — AB (ref 36.0–46.0)
HEMOGLOBIN: 11 g/dL — AB (ref 12.0–15.0)
MCH: 31.1 pg (ref 26.0–34.0)
MCHC: 32.3 g/dL (ref 30.0–36.0)
MCV: 96.3 fL (ref 78.0–100.0)
Platelets: 266 10*3/uL (ref 150–400)
RBC: 3.54 MIL/uL — AB (ref 3.87–5.11)
RDW: 13.7 % (ref 11.5–15.5)
WBC: 8.4 10*3/uL (ref 4.0–10.5)

## 2016-12-23 LAB — COMPREHENSIVE METABOLIC PANEL
ALBUMIN: 3.4 g/dL — AB (ref 3.5–5.0)
ALK PHOS: 75 U/L (ref 38–126)
ALT: 11 U/L — ABNORMAL LOW (ref 14–54)
ANION GAP: 8 (ref 5–15)
AST: 18 U/L (ref 15–41)
BILIRUBIN TOTAL: 0.7 mg/dL (ref 0.3–1.2)
BUN: 23 mg/dL — ABNORMAL HIGH (ref 6–20)
CALCIUM: 9 mg/dL (ref 8.9–10.3)
CO2: 23 mmol/L (ref 22–32)
Chloride: 107 mmol/L (ref 101–111)
Creatinine, Ser: 1.63 mg/dL — ABNORMAL HIGH (ref 0.44–1.00)
GFR, EST AFRICAN AMERICAN: 30 mL/min — AB (ref >60)
GFR, EST NON AFRICAN AMERICAN: 26 mL/min — AB (ref >60)
GLUCOSE: 100 mg/dL — AB (ref 65–99)
POTASSIUM: 3.8 mmol/L (ref 3.5–5.1)
Sodium: 138 mmol/L (ref 135–145)
TOTAL PROTEIN: 5.9 g/dL — AB (ref 6.5–8.1)

## 2016-12-23 LAB — TSH: TSH: 2.656 u[IU]/mL (ref 0.350–4.500)

## 2016-12-23 LAB — VITAMIN B12: VITAMIN B 12: 385 pg/mL (ref 180–914)

## 2016-12-23 MED ORDER — HYDROCODONE-ACETAMINOPHEN 5-325 MG PO TABS: 1.0000 | ORAL_TABLET | Freq: Four times a day (QID) | ORAL | 0 refills | Status: DC | PRN

## 2016-12-23 MED ORDER — CYANOCOBALAMIN 500 MCG PO TABS: 500.0000 ug | ORAL_TABLET | Freq: Every day | ORAL | Status: AC

## 2016-12-23 MED ORDER — FUROSEMIDE 10 MG/ML IJ SOLN
20.0000 mg | Freq: Once | INTRAMUSCULAR | Status: AC
  Administered 2016-12-23: 20 mg via INTRAVENOUS
  Filled 2016-12-23: qty 2

## 2016-12-23 NOTE — Progress Notes (Addendum)
Patient Saturations on Room Air at Rest = 97%  Patient Saturations on [employer] while Ambulating = 80%  Patient Saturations on 3 Liters of oxygen while Ambulating = 85%  Lasix was a method tried and failed.

## 2016-12-23 NOTE — Discharge Summary (Signed)
Physician Discharge Summary  [name]  QMG:[id number]  DOB: 11/18/1921  DOA: 12/22/2016 PCP: Elby Showers, MD  Admit date: 12/22/2016 Discharge date: 12/23/2016  Admitted From: Home  Disposition:  Home   Recommendations for Outpatient Follow-up:  1. Follow up with PCP in 1 week 2. Check CBC/BMP in 1 week to monitor Hgb and renal function   Home Health: Home health PT, RN, Social worker  Equipment/Devices: Oxygen 2L [place] with ambulation   Discharge Condition: Stable  CODE STATUS: Full  Diet recommendation: Heart Healthy   Brief/Interim Summary: [name] is a 83 year old female with medical history significant for A. Fib on chronic anticoagulation,hypothyroidism, chronic kidney disease stage III, asthma/COPD. Patient was brought to the emergency department complaining of shortness of breath and rib pain. Patient had a mechanical fall 2 weeks ago and she was diagnosed with broken ribs and contusion of chest wall. Since then patient has progressively become short of breath and complaining of fatigue. Upon ED evaluation she was found to have mild hypoxia and x-ray showed atelectasis. She was admitted for oxygen supplementation and further workup. Given hypoxia VQ scan was done which was negative for pulmonary embolism, as suspected as patient is on chronic anticoagulation. Patient was monitored overnight, she was given incentive spirometry and she was able to be weaned off oxygen while resting although pulse ox with ambulation reveals needing oxygen as she ambulates. Lab workup was not revealing hemoglobin was stable, vitamin B12 at borderline,TSH normal, renal function at baseline. Patient clinically improve and was deemed to be discharged home with home health PT, RN and social worker. Patient advised to follow-up with primary care doctor.  Subjective: Patient seen and examined, she has no new complaints, denies chest pain, shortness of breath, palpitations, dizziness and weakness. No  acute events overnight, saturation above 91% on room air. Patient afebrile.   Discharge Diagnoses/Hospital Course:  Acute respiratory failure with hypoxia Due to poor inspiratory effort due to pain from rib fractures. CXR shows small pleural effusion w/o any changes from previous and atelectasis/scarring. VQ scan low probability. Patient was treated with O2, incentive spirometry and pain control. Hypoxia resolved at rest, although persist with ambulation. Hemoglobin was stable. BNP was slight elevated although patient dry on exam. Patient was given 1 dose of IV lasix and she was continued on her daily dose. Patient was advised to continue incentive spirometry. She is discharge on O2. RN, PT and [place] aide was arranged.   Chronic diastolic CHF  No signs of fluid overload  BNP was mild elevated She was continued on her home medication without any changes   Anemia of chronic diseases Hgb stable  Continue Iron supplements  Check CBC in 1 week   CKD stage III Cr stable   Lower back pain  Continue supportive therapy  Short term opioid medication give x 3 days   All other chronic medical condition were stable during the hospitalization.  On the day of the discharge the patient's vitals were stable, and no other acute medical condition were reported by patient. Patient was felt safe to be discharge to home.   Discharge Instructions  You were cared for by a hospitalist during your hospital stay. If you have any questions about your discharge medications or the care you received while you were in the hospital after you are discharged, you can call the unit and asked to speak with the hospitalist on call if the hospitalist that took care of you is not available. Once  you are discharged, your primary care physician will handle any further medical issues. Please note that NO REFILLS for any discharge medications will be authorized once you are discharged, as it is imperative that you return to your  primary care physician (or establish a relationship with a primary care physician if you do not have one) for your aftercare needs so that they can reassess your need for medications and monitor your lab values.  Discharge Instructions    Call MD for:  difficulty breathing, headache or visual disturbances    Complete by:  As directed    Call MD for:  extreme fatigue    Complete by:  As directed    Call MD for:  hives    Complete by:  As directed    Call MD for:  persistant dizziness or light-headedness    Complete by:  As directed    Call MD for:  persistant nausea and vomiting    Complete by:  As directed    Call MD for:  redness, tenderness, or signs of infection (pain, swelling, redness, odor or green/yellow discharge around incision site)    Complete by:  As directed    Call MD for:  severe uncontrolled pain    Complete by:  As directed    Call MD for:  temperature >100.4    Complete by:  As directed    Diet - low sodium heart healthy    Complete by:  As directed    Increase activity slowly    Complete by:  As directed      Allergies as of 12/23/2016   No Known Allergies     Medication List    TAKE these medications   ADVAIR DISKUS 250-50 MCG/DOSE Aepb Generic drug:  Fluticasone-Salmeterol Inhale 1 puff into the lungs 2 (two) times daily.   ALPRAZolam 0.5 MG tablet Commonly known as:  XANAX TAKE 1 TABLET BY MOUTH 3 TIMES A DAY AS NEEDED What changed:  See the new instructions.   apixaban 2.5 MG Tabs tablet Commonly known as:  ELIQUIS Take 1 tablet (2.5 mg total) by mouth 2 (two) times daily.   cyanocobalamin 500 MCG tablet Take 1 tablet (500 mcg total) by mouth daily.   diltiazem 120 MG 24 hr capsule Commonly known as:  CARDIZEM CD TAKE ONE CAPSULE BY MOUTH EVERY DAY   ferrous sulfate 325 (65 FE) MG tablet Take 325 mg by mouth daily with breakfast.   furosemide 20 MG tablet Commonly known as:  LASIX TAKE 1 TABLET BY MOUTH EVERY DAY    HYDROcodone-acetaminophen 5-325 MG tablet Commonly known as:  NORCO/VICODIN Take 1 tablet by mouth every 6 (six) hours as needed for moderate pain.   KLOR-CON M20 20 MEQ tablet Generic drug:  potassium chloride SA TAKE 1 TABLET BY MOUTH EVERY DAY   levothyroxine 88 MCG tablet Commonly known as:  SYNTHROID, LEVOTHROID TAKE 1 TABLET BY MOUTH EVERY DAY   metoprolol tartrate 50 MG tablet Commonly known as:  LOPRESSOR TAKE 1 TABLET BY MOUTH TWICE A DAY   PRESERVISION AREDS 2 Caps Take 1 capsule by mouth 2 (two) times daily.   SYSTANE OP Place 1 drop into both eyes daily as needed (dry eyes).   Vitamin D3 1000 units Caps Take 1,000 Units by mouth daily.            Durable Medical Equipment        Start     Ordered   12/23/16 1641  DME Oxygen  Once    Question Answer Comment  Mode or (Route) Nasal cannula   Liters per Minute 2   Frequency Continuous (stationary and portable oxygen unit needed)   Oxygen delivery system Gas      12/23/16 1644       Discharge Care Instructions        Start     Ordered   12/23/16 0000  HYDROcodone-acetaminophen (NORCO/VICODIN) 5-325 MG tablet  Every 6 hours PRN     12/23/16 1644   12/23/16 0000  cyanocobalamin 500 MCG tablet  Daily     12/23/16 1644   12/23/16 0000  Increase activity slowly     12/23/16 1644   12/23/16 0000  Diet - low sodium heart healthy     12/23/16 1644   12/23/16 0000  Call MD for:  temperature >100.4     12/23/16 1644   12/23/16 0000  Call MD for:  persistant nausea and vomiting     12/23/16 1644   12/23/16 0000  Call MD for:  severe uncontrolled pain     12/23/16 1644   12/23/16 0000  Call MD for:  redness, tenderness, or signs of infection (pain, swelling, redness, odor or green/yellow discharge around incision site)     12/23/16 1644   12/23/16 0000  Call MD for:  difficulty breathing, headache or visual disturbances     12/23/16 1644   12/23/16 0000  Call MD for:  hives     12/23/16 1644    12/23/16 0000  Call MD for:  persistant dizziness or light-headedness     12/23/16 1644   12/23/16 0000  Call MD for:  extreme fatigue     12/23/16 1644      No Known Allergies  Consultations:  None    Procedures/Studies: X-ray Chest Pa And Lateral  Result Date: 12/23/2016 CLINICAL DATA:  History of rib fractures and shortness of Breath EXAM: CHEST  2 VIEW COMPARISON:  12/22/2016 FINDINGS: Cardiac shadow is mildly enlarged. Pacing device is seen. Small bilateral pleural effusions are noted stable from the prior exam. No new focal abnormality is seen. The known rib fractures are not well appreciated on this exam. IMPRESSION: Stable small effusions bilaterally. Electronically Signed   By: Inez Catalina M.D.   On: 12/23/2016 09:06   Dg Chest 2 View  Result Date: 12/22/2016 CLINICAL DATA:  Shortness of breath. EXAM: CHEST  2 VIEW COMPARISON:  Chest x-ray from earlier today. FINDINGS: Left chest wall AICD in unchanged position. The cardiomediastinal silhouette is normal in size. Normal pulmonary vascularity. Atherosclerotic calcification of the aortic arch. Bibasilar atelectasis/scarring and small bilateral pleural effusions again noted. IMPRESSION: Unchanged small bilateral pleural effusions and bibasilar atelectasis/scarring. Electronically Signed   By: Titus Dubin M.D.   On: 12/22/2016 14:56   Dg Chest 2 View  Result Date: 12/22/2016 CLINICAL DATA:  [place] 2 weeks ago. Right-sided chest pain and shortness of breath. EXAM: CHEST  2 VIEW COMPARISON:  01/21/2016 FINDINGS: Heart size is normal. Chronic aortic atherosclerosis. Dual lead pacemaker appears unchanged. There are small bilateral pleural effusions. Mild basilar pulmonary scarring. No pneumothorax. No visible rib or spinal fracture on these images. IMPRESSION: No traumatic finding. Aortic atherosclerosis. Small pleural effusions and mild basilar atelectasis or scarring. Electronically Signed   By: Nelson Chimes M.D.   On: 12/22/2016 12:45    Dg Ribs Unilateral Right  Result Date: 12/22/2016 CLINICAL DATA:  [place] 2 weeks ago.  Right-sided rib pain. EXAM: RIGHT RIBS - 2 VIEW  COMPARISON:  Chest radiography same day FINDINGS: Two views show nondisplaced fractures of the anterior tenth and eleventh ribs. Otherwise are ribs appear normal. IMPRESSION: Nondisplaced fractures of the anterior right tenth and eleventh ribs. Electronically Signed   By: Nelson Chimes M.D.   On: 12/22/2016 12:47   Nm Pulmonary Vent And Perf (v/q Scan)  Result Date: 12/22/2016 CLINICAL DATA:  Chest pain, shortness of breath, pleurisy EXAM: NUCLEAR MEDICINE VENTILATION - PERFUSION LUNG SCAN TECHNIQUE: Ventilation images were obtained in multiple projections using inhaled aerosol Tc-82m DTPA. Perfusion images were obtained in multiple projections after intravenous injection of Tc-27m MAA. RADIOPHARMACEUTICALS:  Thirty-two mCi Technetium-42m DTPA aerosol inhalation and 4.37 mCi Technetium-53m MAA IV COMPARISON:  None. FINDINGS: Ventilation: Nonspecific patchy ventilatory defects, suspect chronic lung disease when compared to the prior chest x-ray. Perfusion: No significant wedge shaped peripheral perfusion defects to suggest acute pulmonary embolism. Photopenic defect from the pacemaker noted. IMPRESSION: Low probability for pulmonary embolus. Suspect a component of chronic lung disease. Electronically Signed   By: Jerilynn Mages.  Shick M.D.   On: 12/22/2016 18:11     Discharge Exam: Vitals:   12/23/16 0900 12/23/16 1619  BP: (!) 111/55   Pulse: 96   Resp: 16   Temp: 97.6 F (36.4 C)   SpO2: 100% 100%   Vitals:   12/23/16 0517 12/23/16 0749 12/23/16 0900 12/23/16 1619  BP: (!) 100/51  (!) 111/55   Pulse: 85  96   Resp: (!) 22  16   Temp: (!) 97.5 F (36.4 C)  97.6 F (36.4 C)   TempSrc: Oral  Oral   SpO2:  99% 100% 100%    General: Pt is alert, awake, not in acute distress Cardiovascular: Irr Irr, S1/S2 +, no rubs, no gallops Respiratory: CTA bilaterally, no  wheezing, no rhonchi, posterior lower chest wall tenderness.  Abdominal: Soft, NT, ND, bowel sounds + Extremities: no edema.    The results of significant diagnostics from this hospitalization (including imaging, microbiology, ancillary and laboratory) are listed below for reference.     Microbiology: No results found for this or any previous visit (from the past 240 hour(s)).   Labs: BNP (last 3 results)  Recent Labs  01/21/16 1306 12/22/16 1334  BNP 510.5* 371.0*   Basic Metabolic Panel:  Recent Labs Lab 12/22/16 1334 12/23/16 0405  NA 137 138  K 4.5 3.8  CL 106 107  CO2 21* 23  GLUCOSE 103* 100*  BUN 24* 23*  CREATININE 1.59* 1.63*  CALCIUM 9.5 9.0   Liver Function Tests:  Recent Labs Lab 12/23/16 0405  AST 18  ALT 11*  ALKPHOS 75  BILITOT 0.7  PROT 5.9*  ALBUMIN 3.4*   No results for input(s): LIPASE, AMYLASE in the last 168 hours. No results for input(s): AMMONIA in the last 168 hours. CBC:  Recent Labs Lab 12/22/16 1334 12/23/16 0405  WBC 8.3 8.4  HGB 11.3* 11.0*  HCT 33.9* 34.1*  MCV 94.2 96.3  PLT 279 266   Cardiac Enzymes: No results for input(s): CKTOTAL, CKMB, CKMBINDEX, TROPONINI in the last 168 hours. BNP: Invalid input(s): POCBNP CBG: No results for input(s): GLUCAP in the last 168 hours. D-Dimer No results for input(s): DDIMER in the last 72 hours. Hgb A1c No results for input(s): HGBA1C in the last 72 hours. Lipid Profile No results for input(s): CHOL, HDL, LDLCALC, TRIG, CHOLHDL, LDLDIRECT in the last 72 hours. Thyroid function studies  Recent Labs  12/23/16 1156  TSH 2.656   Anemia work  up  Recent Labs  12/23/16 1156  VITAMINB12 385   Urinalysis    Component Value Date/Time   COLORURINE YELLOW 12/22/2016 1610   APPEARANCEUR CLEAR 12/22/2016 1610   LABSPEC 1.008 12/22/2016 1610   PHURINE 8.0 12/22/2016 1610   GLUCOSEU NEGATIVE 12/22/2016 1610   HGBUR NEGATIVE 12/22/2016 1610   BILIRUBINUR NEGATIVE  12/22/2016 1610   BILIRUBINUR NEG 01/21/2016 1257   KETONESUR 5 (A) 12/22/2016 1610   PROTEINUR NEGATIVE 12/22/2016 1610   UROBILINOGEN negative 01/21/2016 1257   UROBILINOGEN 0.2 09/15/2010 1458   NITRITE NEGATIVE 12/22/2016 1610   LEUKOCYTESUR NEGATIVE 12/22/2016 1610   Sepsis Labs Invalid input(s): PROCALCITONIN,  WBC,  LACTICIDVEN Microbiology No results found for this or any previous visit (from the past 240 hour(s)).   Time coordinating discharge: 30 minutes  SIGNED:  Chipper Oman, MD  [facility] 12/23/2016, 4:45 PM  Pager please text page via  [email or web address] Password [email or web address]

## 2016-12-23 NOTE — Care Management Note (Addendum)
Case Management Note  Patient Details  Name: [name] MRN: [id number] Date of Birth: 1921/07/07  Subjective/Objective:  Acute resp failure with hypoxia, lower back pain, CHF,                   Action/Plan: Discharge Planning: NCM spoke to pt's dtr, [name]. Pt lives at [place] apt. Offered choice for [facility]. Dtr agreeable to [facility] for [facility]. Contacted [facility] for oxygen and 3n1 bedside commode. Pt has RW with seat at home. Explained to dtr that portable oxygen will be deliver to her room then concentrator to the home. Contact numbers added to dc instructions. Dtr states PCP had discussed pt having Palliative Care services at home. NCM spoke to attending. Will arrange Palliative Care with [facility]. Will Faxed order to [place] when dc summary becomes available.    PCP Elby Showers    Expected Discharge Date:  12/23/16               Expected Discharge Plan:  [place]  In-House Referral:  NA  Discharge planning Services  CM Consult  Post Acute Care Choice:  Home Health Choice offered to:  Adult Children  DME Arranged:  Oxygen DME Agency:  [place] Arranged:  PT, RN, Social Work, Nurse's Aide [place] Agency:  [place]  Status of Service:  Completed, signed off  If discussed at [employer], dates discussed:    Additional Comments:  Erenest Rasher, RN 12/23/2016, 5:09 PM

## 2016-12-23 NOTE — Progress Notes (Signed)
[name] to be D/C'd to home with home health per MD order.  Discussed with the patient and all questions fully answered.  VSS, Skin clean, dry and intact without evidence of skin break down, no evidence of skin tears noted. IV catheter discontinued intact. Site without signs and symptoms of complications. Dressing and pressure applied.  An After Visit Summary was printed and given to the patient. Patient received prescriptions and oxygen delivered to room.  D/c education completed with patient/family including follow up instructions, medication list, d/c activities limitations if indicated, with other d/c instructions as indicated by MD - patient able to verbalize understanding, all questions fully answered.   Patient instructed to return to ED, call 911, or call MD for any changes in condition.   Patient escorted via [place], and D/C home via private auto.  Morley Kos Price 12/23/2016 6:22 PM

## 2016-12-25 LAB — VITAMIN D 25 HYDROXY (VIT D DEFICIENCY, FRACTURES): Vit D, 25-Hydroxy: 40.7 ng/mL (ref 30.0–100.0)

## 2016-12-25 NOTE — Progress Notes (Signed)
NCM contacted [place] to arrange [place] with [facility]. Spoke to [place]. Jonnie Finner RN CCM Case Mgmt phone [phone]

## 2017-01-02 ENCOUNTER — Telehealth: Payer: Self-pay | Admitting: Internal Medicine

## 2017-01-02 DIAGNOSIS — Z9981 Dependence on supplemental oxygen: Secondary | ICD-10-CM | POA: Diagnosis not present

## 2017-01-02 DIAGNOSIS — I5032 Chronic diastolic (congestive) heart failure: Secondary | ICD-10-CM | POA: Diagnosis not present

## 2017-01-02 DIAGNOSIS — N183 Chronic kidney disease, stage 3 (moderate): Secondary | ICD-10-CM | POA: Diagnosis not present

## 2017-01-02 DIAGNOSIS — S2241XD Multiple fractures of ribs, right side, subsequent encounter for fracture with routine healing: Secondary | ICD-10-CM | POA: Diagnosis not present

## 2017-01-02 DIAGNOSIS — J449 Chronic obstructive pulmonary disease, unspecified: Secondary | ICD-10-CM | POA: Diagnosis not present

## 2017-01-02 DIAGNOSIS — K219 Gastro-esophageal reflux disease without esophagitis: Secondary | ICD-10-CM | POA: Diagnosis not present

## 2017-01-02 DIAGNOSIS — J45909 Unspecified asthma, uncomplicated: Secondary | ICD-10-CM | POA: Diagnosis not present

## 2017-01-02 DIAGNOSIS — D631 Anemia in chronic kidney disease: Secondary | ICD-10-CM | POA: Diagnosis not present

## 2017-01-02 DIAGNOSIS — I4891 Unspecified atrial fibrillation: Secondary | ICD-10-CM | POA: Diagnosis not present

## 2017-01-02 DIAGNOSIS — I130 Hypertensive heart and chronic kidney disease with heart failure and stage 1 through stage 4 chronic kidney disease, or unspecified chronic kidney disease: Secondary | ICD-10-CM | POA: Diagnosis not present

## 2017-01-02 DIAGNOSIS — Z9181 History of falling: Secondary | ICD-10-CM | POA: Diagnosis not present

## 2017-01-02 NOTE — Telephone Encounter (Signed)
[name] with [facility] is calling; she is [place] home with an order for Home Health and PT.  They have set that up for her today.    Phone #:  [phone]

## 2017-01-03 DIAGNOSIS — S2241XD Multiple fractures of ribs, right side, subsequent encounter for fracture with routine healing: Secondary | ICD-10-CM | POA: Diagnosis not present

## 2017-01-03 DIAGNOSIS — J45909 Unspecified asthma, uncomplicated: Secondary | ICD-10-CM | POA: Diagnosis not present

## 2017-01-03 DIAGNOSIS — N183 Chronic kidney disease, stage 3 (moderate): Secondary | ICD-10-CM | POA: Diagnosis not present

## 2017-01-03 DIAGNOSIS — J449 Chronic obstructive pulmonary disease, unspecified: Secondary | ICD-10-CM | POA: Diagnosis not present

## 2017-01-03 DIAGNOSIS — I130 Hypertensive heart and chronic kidney disease with heart failure and stage 1 through stage 4 chronic kidney disease, or unspecified chronic kidney disease: Secondary | ICD-10-CM | POA: Diagnosis not present

## 2017-01-03 DIAGNOSIS — I5032 Chronic diastolic (congestive) heart failure: Secondary | ICD-10-CM | POA: Diagnosis not present

## 2017-01-04 DIAGNOSIS — I130 Hypertensive heart and chronic kidney disease with heart failure and stage 1 through stage 4 chronic kidney disease, or unspecified chronic kidney disease: Secondary | ICD-10-CM | POA: Diagnosis not present

## 2017-01-04 DIAGNOSIS — S2241XD Multiple fractures of ribs, right side, subsequent encounter for fracture with routine healing: Secondary | ICD-10-CM | POA: Diagnosis not present

## 2017-01-04 DIAGNOSIS — N183 Chronic kidney disease, stage 3 (moderate): Secondary | ICD-10-CM | POA: Diagnosis not present

## 2017-01-04 DIAGNOSIS — J449 Chronic obstructive pulmonary disease, unspecified: Secondary | ICD-10-CM | POA: Diagnosis not present

## 2017-01-04 DIAGNOSIS — I5032 Chronic diastolic (congestive) heart failure: Secondary | ICD-10-CM | POA: Diagnosis not present

## 2017-01-04 DIAGNOSIS — J45909 Unspecified asthma, uncomplicated: Secondary | ICD-10-CM | POA: Diagnosis not present

## 2017-01-09 DIAGNOSIS — S2239XA Fracture of one rib, unspecified side, initial encounter for closed fracture: Secondary | ICD-10-CM | POA: Diagnosis not present

## 2017-01-09 DIAGNOSIS — I130 Hypertensive heart and chronic kidney disease with heart failure and stage 1 through stage 4 chronic kidney disease, or unspecified chronic kidney disease: Secondary | ICD-10-CM | POA: Diagnosis not present

## 2017-01-09 DIAGNOSIS — J45909 Unspecified asthma, uncomplicated: Secondary | ICD-10-CM | POA: Diagnosis not present

## 2017-01-09 DIAGNOSIS — J449 Chronic obstructive pulmonary disease, unspecified: Secondary | ICD-10-CM | POA: Diagnosis not present

## 2017-01-10 DIAGNOSIS — J449 Chronic obstructive pulmonary disease, unspecified: Secondary | ICD-10-CM | POA: Diagnosis not present

## 2017-01-10 DIAGNOSIS — J45909 Unspecified asthma, uncomplicated: Secondary | ICD-10-CM | POA: Diagnosis not present

## 2017-01-10 DIAGNOSIS — S2241XD Multiple fractures of ribs, right side, subsequent encounter for fracture with routine healing: Secondary | ICD-10-CM | POA: Diagnosis not present

## 2017-01-10 DIAGNOSIS — I130 Hypertensive heart and chronic kidney disease with heart failure and stage 1 through stage 4 chronic kidney disease, or unspecified chronic kidney disease: Secondary | ICD-10-CM | POA: Diagnosis not present

## 2017-01-10 DIAGNOSIS — I5032 Chronic diastolic (congestive) heart failure: Secondary | ICD-10-CM | POA: Diagnosis not present

## 2017-01-10 DIAGNOSIS — N183 Chronic kidney disease, stage 3 (moderate): Secondary | ICD-10-CM | POA: Diagnosis not present

## 2017-01-12 DIAGNOSIS — J45909 Unspecified asthma, uncomplicated: Secondary | ICD-10-CM | POA: Diagnosis not present

## 2017-01-12 DIAGNOSIS — N183 Chronic kidney disease, stage 3 (moderate): Secondary | ICD-10-CM | POA: Diagnosis not present

## 2017-01-12 DIAGNOSIS — I130 Hypertensive heart and chronic kidney disease with heart failure and stage 1 through stage 4 chronic kidney disease, or unspecified chronic kidney disease: Secondary | ICD-10-CM | POA: Diagnosis not present

## 2017-01-12 DIAGNOSIS — S2241XD Multiple fractures of ribs, right side, subsequent encounter for fracture with routine healing: Secondary | ICD-10-CM | POA: Diagnosis not present

## 2017-01-12 DIAGNOSIS — I5032 Chronic diastolic (congestive) heart failure: Secondary | ICD-10-CM | POA: Diagnosis not present

## 2017-01-12 DIAGNOSIS — J449 Chronic obstructive pulmonary disease, unspecified: Secondary | ICD-10-CM | POA: Diagnosis not present

## 2017-01-16 DIAGNOSIS — S2241XD Multiple fractures of ribs, right side, subsequent encounter for fracture with routine healing: Secondary | ICD-10-CM | POA: Diagnosis not present

## 2017-01-16 DIAGNOSIS — J45909 Unspecified asthma, uncomplicated: Secondary | ICD-10-CM | POA: Diagnosis not present

## 2017-01-16 DIAGNOSIS — N183 Chronic kidney disease, stage 3 (moderate): Secondary | ICD-10-CM | POA: Diagnosis not present

## 2017-01-16 DIAGNOSIS — I130 Hypertensive heart and chronic kidney disease with heart failure and stage 1 through stage 4 chronic kidney disease, or unspecified chronic kidney disease: Secondary | ICD-10-CM | POA: Diagnosis not present

## 2017-01-16 DIAGNOSIS — J449 Chronic obstructive pulmonary disease, unspecified: Secondary | ICD-10-CM | POA: Diagnosis not present

## 2017-01-16 DIAGNOSIS — I5032 Chronic diastolic (congestive) heart failure: Secondary | ICD-10-CM | POA: Diagnosis not present

## 2017-01-18 DIAGNOSIS — I5032 Chronic diastolic (congestive) heart failure: Secondary | ICD-10-CM | POA: Diagnosis not present

## 2017-01-18 DIAGNOSIS — J45909 Unspecified asthma, uncomplicated: Secondary | ICD-10-CM | POA: Diagnosis not present

## 2017-01-18 DIAGNOSIS — J449 Chronic obstructive pulmonary disease, unspecified: Secondary | ICD-10-CM | POA: Diagnosis not present

## 2017-01-18 DIAGNOSIS — I130 Hypertensive heart and chronic kidney disease with heart failure and stage 1 through stage 4 chronic kidney disease, or unspecified chronic kidney disease: Secondary | ICD-10-CM | POA: Diagnosis not present

## 2017-01-18 DIAGNOSIS — S2241XD Multiple fractures of ribs, right side, subsequent encounter for fracture with routine healing: Secondary | ICD-10-CM | POA: Diagnosis not present

## 2017-01-18 DIAGNOSIS — N183 Chronic kidney disease, stage 3 (moderate): Secondary | ICD-10-CM | POA: Diagnosis not present

## 2017-01-22 DIAGNOSIS — S2241XD Multiple fractures of ribs, right side, subsequent encounter for fracture with routine healing: Secondary | ICD-10-CM | POA: Diagnosis not present

## 2017-01-22 DIAGNOSIS — I130 Hypertensive heart and chronic kidney disease with heart failure and stage 1 through stage 4 chronic kidney disease, or unspecified chronic kidney disease: Secondary | ICD-10-CM | POA: Diagnosis not present

## 2017-01-22 DIAGNOSIS — J45909 Unspecified asthma, uncomplicated: Secondary | ICD-10-CM | POA: Diagnosis not present

## 2017-01-22 DIAGNOSIS — N183 Chronic kidney disease, stage 3 (moderate): Secondary | ICD-10-CM | POA: Diagnosis not present

## 2017-01-22 DIAGNOSIS — I5032 Chronic diastolic (congestive) heart failure: Secondary | ICD-10-CM | POA: Diagnosis not present

## 2017-01-22 DIAGNOSIS — J449 Chronic obstructive pulmonary disease, unspecified: Secondary | ICD-10-CM | POA: Diagnosis not present

## 2017-01-24 DIAGNOSIS — I130 Hypertensive heart and chronic kidney disease with heart failure and stage 1 through stage 4 chronic kidney disease, or unspecified chronic kidney disease: Secondary | ICD-10-CM | POA: Diagnosis not present

## 2017-01-24 DIAGNOSIS — S2241XD Multiple fractures of ribs, right side, subsequent encounter for fracture with routine healing: Secondary | ICD-10-CM | POA: Diagnosis not present

## 2017-01-24 DIAGNOSIS — J45909 Unspecified asthma, uncomplicated: Secondary | ICD-10-CM | POA: Diagnosis not present

## 2017-01-24 DIAGNOSIS — J449 Chronic obstructive pulmonary disease, unspecified: Secondary | ICD-10-CM | POA: Diagnosis not present

## 2017-01-24 DIAGNOSIS — I5032 Chronic diastolic (congestive) heart failure: Secondary | ICD-10-CM | POA: Diagnosis not present

## 2017-01-24 DIAGNOSIS — N183 Chronic kidney disease, stage 3 (moderate): Secondary | ICD-10-CM | POA: Diagnosis not present

## 2017-01-26 DIAGNOSIS — N183 Chronic kidney disease, stage 3 (moderate): Secondary | ICD-10-CM | POA: Diagnosis not present

## 2017-01-26 DIAGNOSIS — I130 Hypertensive heart and chronic kidney disease with heart failure and stage 1 through stage 4 chronic kidney disease, or unspecified chronic kidney disease: Secondary | ICD-10-CM | POA: Diagnosis not present

## 2017-01-26 DIAGNOSIS — S2241XD Multiple fractures of ribs, right side, subsequent encounter for fracture with routine healing: Secondary | ICD-10-CM | POA: Diagnosis not present

## 2017-01-26 DIAGNOSIS — J45909 Unspecified asthma, uncomplicated: Secondary | ICD-10-CM | POA: Diagnosis not present

## 2017-01-26 DIAGNOSIS — I5032 Chronic diastolic (congestive) heart failure: Secondary | ICD-10-CM | POA: Diagnosis not present

## 2017-01-26 DIAGNOSIS — J449 Chronic obstructive pulmonary disease, unspecified: Secondary | ICD-10-CM | POA: Diagnosis not present

## 2017-01-29 DIAGNOSIS — J45909 Unspecified asthma, uncomplicated: Secondary | ICD-10-CM | POA: Diagnosis not present

## 2017-01-29 DIAGNOSIS — I130 Hypertensive heart and chronic kidney disease with heart failure and stage 1 through stage 4 chronic kidney disease, or unspecified chronic kidney disease: Secondary | ICD-10-CM | POA: Diagnosis not present

## 2017-01-29 DIAGNOSIS — S2241XD Multiple fractures of ribs, right side, subsequent encounter for fracture with routine healing: Secondary | ICD-10-CM | POA: Diagnosis not present

## 2017-01-29 DIAGNOSIS — J449 Chronic obstructive pulmonary disease, unspecified: Secondary | ICD-10-CM | POA: Diagnosis not present

## 2017-01-29 DIAGNOSIS — I5032 Chronic diastolic (congestive) heart failure: Secondary | ICD-10-CM | POA: Diagnosis not present

## 2017-01-29 DIAGNOSIS — N183 Chronic kidney disease, stage 3 (moderate): Secondary | ICD-10-CM | POA: Diagnosis not present

## 2017-02-01 DIAGNOSIS — S2241XD Multiple fractures of ribs, right side, subsequent encounter for fracture with routine healing: Secondary | ICD-10-CM | POA: Diagnosis not present

## 2017-02-01 DIAGNOSIS — I5032 Chronic diastolic (congestive) heart failure: Secondary | ICD-10-CM | POA: Diagnosis not present

## 2017-02-01 DIAGNOSIS — J449 Chronic obstructive pulmonary disease, unspecified: Secondary | ICD-10-CM | POA: Diagnosis not present

## 2017-02-01 DIAGNOSIS — J45909 Unspecified asthma, uncomplicated: Secondary | ICD-10-CM | POA: Diagnosis not present

## 2017-02-01 DIAGNOSIS — I130 Hypertensive heart and chronic kidney disease with heart failure and stage 1 through stage 4 chronic kidney disease, or unspecified chronic kidney disease: Secondary | ICD-10-CM | POA: Diagnosis not present

## 2017-02-01 DIAGNOSIS — N183 Chronic kidney disease, stage 3 (moderate): Secondary | ICD-10-CM | POA: Diagnosis not present

## 2017-02-06 DIAGNOSIS — J449 Chronic obstructive pulmonary disease, unspecified: Secondary | ICD-10-CM | POA: Diagnosis not present

## 2017-02-06 DIAGNOSIS — S2241XD Multiple fractures of ribs, right side, subsequent encounter for fracture with routine healing: Secondary | ICD-10-CM | POA: Diagnosis not present

## 2017-02-06 DIAGNOSIS — I130 Hypertensive heart and chronic kidney disease with heart failure and stage 1 through stage 4 chronic kidney disease, or unspecified chronic kidney disease: Secondary | ICD-10-CM | POA: Diagnosis not present

## 2017-02-06 DIAGNOSIS — J45909 Unspecified asthma, uncomplicated: Secondary | ICD-10-CM | POA: Diagnosis not present

## 2017-02-06 DIAGNOSIS — I5032 Chronic diastolic (congestive) heart failure: Secondary | ICD-10-CM | POA: Diagnosis not present

## 2017-02-06 DIAGNOSIS — N183 Chronic kidney disease, stage 3 (moderate): Secondary | ICD-10-CM | POA: Diagnosis not present

## 2017-02-07 ENCOUNTER — Telehealth: Payer: Self-pay | Admitting: Cardiology

## 2017-02-07 ENCOUNTER — Ambulatory Visit (INDEPENDENT_AMBULATORY_CARE_PROVIDER_SITE_OTHER): Payer: Medicare Other | Admitting: *Deleted

## 2017-02-07 DIAGNOSIS — S2241XD Multiple fractures of ribs, right side, subsequent encounter for fracture with routine healing: Secondary | ICD-10-CM | POA: Diagnosis not present

## 2017-02-07 DIAGNOSIS — I5032 Chronic diastolic (congestive) heart failure: Secondary | ICD-10-CM | POA: Diagnosis not present

## 2017-02-07 DIAGNOSIS — I495 Sick sinus syndrome: Secondary | ICD-10-CM | POA: Diagnosis not present

## 2017-02-07 DIAGNOSIS — J45909 Unspecified asthma, uncomplicated: Secondary | ICD-10-CM | POA: Diagnosis not present

## 2017-02-07 DIAGNOSIS — N183 Chronic kidney disease, stage 3 (moderate): Secondary | ICD-10-CM | POA: Diagnosis not present

## 2017-02-07 DIAGNOSIS — I130 Hypertensive heart and chronic kidney disease with heart failure and stage 1 through stage 4 chronic kidney disease, or unspecified chronic kidney disease: Secondary | ICD-10-CM | POA: Diagnosis not present

## 2017-02-07 DIAGNOSIS — J449 Chronic obstructive pulmonary disease, unspecified: Secondary | ICD-10-CM | POA: Diagnosis not present

## 2017-02-07 NOTE — Telephone Encounter (Signed)
Spoke with pt and reminded pt of remote transmission that is due today. Pt verbalized understanding.   

## 2017-02-08 ENCOUNTER — Encounter: Payer: Self-pay | Admitting: Cardiology

## 2017-02-08 ENCOUNTER — Other Ambulatory Visit: Payer: Self-pay | Admitting: Internal Medicine

## 2017-02-08 ENCOUNTER — Other Ambulatory Visit: Payer: Self-pay | Admitting: Cardiology

## 2017-02-08 NOTE — Telephone Encounter (Signed)
This is Dr. Hochrein's pt. °

## 2017-02-08 NOTE — Progress Notes (Signed)
Remote pacemaker transmission.   

## 2017-02-08 NOTE — Telephone Encounter (Signed)
Rx(s) sent to pharmacy electronically.  

## 2017-02-09 DIAGNOSIS — J449 Chronic obstructive pulmonary disease, unspecified: Secondary | ICD-10-CM | POA: Diagnosis not present

## 2017-02-09 DIAGNOSIS — S2241XD Multiple fractures of ribs, right side, subsequent encounter for fracture with routine healing: Secondary | ICD-10-CM | POA: Diagnosis not present

## 2017-02-09 DIAGNOSIS — N183 Chronic kidney disease, stage 3 (moderate): Secondary | ICD-10-CM | POA: Diagnosis not present

## 2017-02-09 DIAGNOSIS — I130 Hypertensive heart and chronic kidney disease with heart failure and stage 1 through stage 4 chronic kidney disease, or unspecified chronic kidney disease: Secondary | ICD-10-CM | POA: Diagnosis not present

## 2017-02-09 DIAGNOSIS — J45909 Unspecified asthma, uncomplicated: Secondary | ICD-10-CM | POA: Diagnosis not present

## 2017-02-09 DIAGNOSIS — I5032 Chronic diastolic (congestive) heart failure: Secondary | ICD-10-CM | POA: Diagnosis not present

## 2017-02-11 ENCOUNTER — Other Ambulatory Visit: Payer: Self-pay | Admitting: Internal Medicine

## 2017-02-11 NOTE — Telephone Encounter (Signed)
Refill x 6 months 

## 2017-02-12 ENCOUNTER — Telehealth: Payer: Self-pay

## 2017-02-12 MED ORDER — ADVAIR DISKUS 250-50 MCG/DOSE IN AEPB: 1.0000 | INHALATION_SPRAY | Freq: Two times a day (BID) | RESPIRATORY_TRACT | 3 refills | Status: DC

## 2017-02-12 NOTE — Telephone Encounter (Signed)
Received fax from [facility] in regards to a refill on Advair Disku 250/50 for patient. Medication was refilled per Dr. Verlene Mayer request. Sent 1 yr

## 2017-02-13 DIAGNOSIS — N183 Chronic kidney disease, stage 3 (moderate): Secondary | ICD-10-CM | POA: Diagnosis not present

## 2017-02-13 DIAGNOSIS — S2241XD Multiple fractures of ribs, right side, subsequent encounter for fracture with routine healing: Secondary | ICD-10-CM | POA: Diagnosis not present

## 2017-02-13 DIAGNOSIS — I130 Hypertensive heart and chronic kidney disease with heart failure and stage 1 through stage 4 chronic kidney disease, or unspecified chronic kidney disease: Secondary | ICD-10-CM | POA: Diagnosis not present

## 2017-02-13 DIAGNOSIS — J45909 Unspecified asthma, uncomplicated: Secondary | ICD-10-CM | POA: Diagnosis not present

## 2017-02-13 DIAGNOSIS — J449 Chronic obstructive pulmonary disease, unspecified: Secondary | ICD-10-CM | POA: Diagnosis not present

## 2017-02-13 DIAGNOSIS — I5032 Chronic diastolic (congestive) heart failure: Secondary | ICD-10-CM | POA: Diagnosis not present

## 2017-02-13 LAB — CUP PACEART REMOTE DEVICE CHECK
Battery Impedance: 276 Ohm
Implantable Lead Implant Date: 20051017
Implantable Lead Implant Date: 20051017
Implantable Lead Location: 753860
Implantable Lead Model: 5076
Lead Channel Impedance Value: 463 Ohm
Lead Channel Impedance Value: 67 Ohm
Lead Channel Pacing Threshold Amplitude: 0.75 V
Lead Channel Setting Sensing Sensitivity: 4 mV
MDC IDC LEAD LOCATION: 753859
MDC IDC MSMT BATTERY REMAINING LONGEVITY: 116 mo
MDC IDC MSMT BATTERY VOLTAGE: 2.79 V
MDC IDC MSMT LEADCHNL RV PACING THRESHOLD PULSEWIDTH: 0.4 ms
MDC IDC PG IMPLANT DT: 20150914
MDC IDC SESS DTM: 20181107180923
MDC IDC SET LEADCHNL RV PACING AMPLITUDE: 2.5 V
MDC IDC SET LEADCHNL RV PACING PULSEWIDTH: 0.4 ms
MDC IDC STAT BRADY RV PERCENT PACED: 16 %

## 2017-02-16 DIAGNOSIS — N183 Chronic kidney disease, stage 3 (moderate): Secondary | ICD-10-CM | POA: Diagnosis not present

## 2017-02-16 DIAGNOSIS — S2241XD Multiple fractures of ribs, right side, subsequent encounter for fracture with routine healing: Secondary | ICD-10-CM | POA: Diagnosis not present

## 2017-02-16 DIAGNOSIS — J449 Chronic obstructive pulmonary disease, unspecified: Secondary | ICD-10-CM | POA: Diagnosis not present

## 2017-02-16 DIAGNOSIS — I130 Hypertensive heart and chronic kidney disease with heart failure and stage 1 through stage 4 chronic kidney disease, or unspecified chronic kidney disease: Secondary | ICD-10-CM | POA: Diagnosis not present

## 2017-02-16 DIAGNOSIS — I5032 Chronic diastolic (congestive) heart failure: Secondary | ICD-10-CM | POA: Diagnosis not present

## 2017-02-16 DIAGNOSIS — J45909 Unspecified asthma, uncomplicated: Secondary | ICD-10-CM | POA: Diagnosis not present

## 2017-02-20 DIAGNOSIS — I130 Hypertensive heart and chronic kidney disease with heart failure and stage 1 through stage 4 chronic kidney disease, or unspecified chronic kidney disease: Secondary | ICD-10-CM | POA: Diagnosis not present

## 2017-02-20 DIAGNOSIS — J45909 Unspecified asthma, uncomplicated: Secondary | ICD-10-CM | POA: Diagnosis not present

## 2017-02-20 DIAGNOSIS — J449 Chronic obstructive pulmonary disease, unspecified: Secondary | ICD-10-CM | POA: Diagnosis not present

## 2017-02-20 DIAGNOSIS — S2241XD Multiple fractures of ribs, right side, subsequent encounter for fracture with routine healing: Secondary | ICD-10-CM | POA: Diagnosis not present

## 2017-02-20 DIAGNOSIS — N183 Chronic kidney disease, stage 3 (moderate): Secondary | ICD-10-CM | POA: Diagnosis not present

## 2017-02-20 DIAGNOSIS — I5032 Chronic diastolic (congestive) heart failure: Secondary | ICD-10-CM | POA: Diagnosis not present

## 2017-02-23 DIAGNOSIS — I130 Hypertensive heart and chronic kidney disease with heart failure and stage 1 through stage 4 chronic kidney disease, or unspecified chronic kidney disease: Secondary | ICD-10-CM | POA: Diagnosis not present

## 2017-02-23 DIAGNOSIS — I5032 Chronic diastolic (congestive) heart failure: Secondary | ICD-10-CM | POA: Diagnosis not present

## 2017-02-23 DIAGNOSIS — N183 Chronic kidney disease, stage 3 (moderate): Secondary | ICD-10-CM | POA: Diagnosis not present

## 2017-02-23 DIAGNOSIS — J45909 Unspecified asthma, uncomplicated: Secondary | ICD-10-CM | POA: Diagnosis not present

## 2017-02-23 DIAGNOSIS — J449 Chronic obstructive pulmonary disease, unspecified: Secondary | ICD-10-CM | POA: Diagnosis not present

## 2017-02-23 DIAGNOSIS — S2241XD Multiple fractures of ribs, right side, subsequent encounter for fracture with routine healing: Secondary | ICD-10-CM | POA: Diagnosis not present

## 2017-02-27 DIAGNOSIS — J449 Chronic obstructive pulmonary disease, unspecified: Secondary | ICD-10-CM | POA: Diagnosis not present

## 2017-02-27 DIAGNOSIS — I130 Hypertensive heart and chronic kidney disease with heart failure and stage 1 through stage 4 chronic kidney disease, or unspecified chronic kidney disease: Secondary | ICD-10-CM | POA: Diagnosis not present

## 2017-02-27 DIAGNOSIS — J45909 Unspecified asthma, uncomplicated: Secondary | ICD-10-CM | POA: Diagnosis not present

## 2017-02-27 DIAGNOSIS — I5032 Chronic diastolic (congestive) heart failure: Secondary | ICD-10-CM | POA: Diagnosis not present

## 2017-02-27 DIAGNOSIS — S2241XD Multiple fractures of ribs, right side, subsequent encounter for fracture with routine healing: Secondary | ICD-10-CM | POA: Diagnosis not present

## 2017-02-27 DIAGNOSIS — N183 Chronic kidney disease, stage 3 (moderate): Secondary | ICD-10-CM | POA: Diagnosis not present

## 2017-03-01 DIAGNOSIS — J45909 Unspecified asthma, uncomplicated: Secondary | ICD-10-CM | POA: Diagnosis not present

## 2017-03-01 DIAGNOSIS — I5032 Chronic diastolic (congestive) heart failure: Secondary | ICD-10-CM | POA: Diagnosis not present

## 2017-03-01 DIAGNOSIS — I130 Hypertensive heart and chronic kidney disease with heart failure and stage 1 through stage 4 chronic kidney disease, or unspecified chronic kidney disease: Secondary | ICD-10-CM | POA: Diagnosis not present

## 2017-03-01 DIAGNOSIS — N183 Chronic kidney disease, stage 3 (moderate): Secondary | ICD-10-CM | POA: Diagnosis not present

## 2017-03-01 DIAGNOSIS — S2241XD Multiple fractures of ribs, right side, subsequent encounter for fracture with routine healing: Secondary | ICD-10-CM | POA: Diagnosis not present

## 2017-03-01 DIAGNOSIS — J449 Chronic obstructive pulmonary disease, unspecified: Secondary | ICD-10-CM | POA: Diagnosis not present

## 2017-03-02 ENCOUNTER — Telehealth: Payer: Self-pay | Admitting: Internal Medicine

## 2017-03-02 MED ORDER — ADVAIR DISKUS 250-50 MCG/DOSE IN AEPB: 1.0000 | INHALATION_SPRAY | Freq: Two times a day (BID) | RESPIRATORY_TRACT | 3 refills | Status: AC

## 2017-03-02 NOTE — Telephone Encounter (Signed)
Patient needs refill on Advair. Per mail order pharmacy, they are awaiting authorization from PCP, which was sent "weeks ago". Patient states that she only has 2 doses left. Patient would like to be notified when authorization has been sent to pharmacy. Please advise.

## 2017-03-02 NOTE — Telephone Encounter (Signed)
Please refill x one year 

## 2017-03-03 DIAGNOSIS — Z9181 History of falling: Secondary | ICD-10-CM | POA: Diagnosis not present

## 2017-03-03 DIAGNOSIS — S2241XD Multiple fractures of ribs, right side, subsequent encounter for fracture with routine healing: Secondary | ICD-10-CM | POA: Diagnosis not present

## 2017-03-03 DIAGNOSIS — J45909 Unspecified asthma, uncomplicated: Secondary | ICD-10-CM | POA: Diagnosis not present

## 2017-03-03 DIAGNOSIS — J449 Chronic obstructive pulmonary disease, unspecified: Secondary | ICD-10-CM | POA: Diagnosis not present

## 2017-03-03 DIAGNOSIS — I4891 Unspecified atrial fibrillation: Secondary | ICD-10-CM | POA: Diagnosis not present

## 2017-03-03 DIAGNOSIS — Z9981 Dependence on supplemental oxygen: Secondary | ICD-10-CM | POA: Diagnosis not present

## 2017-03-03 DIAGNOSIS — I5032 Chronic diastolic (congestive) heart failure: Secondary | ICD-10-CM | POA: Diagnosis not present

## 2017-03-03 DIAGNOSIS — K219 Gastro-esophageal reflux disease without esophagitis: Secondary | ICD-10-CM | POA: Diagnosis not present

## 2017-03-03 DIAGNOSIS — N183 Chronic kidney disease, stage 3 (moderate): Secondary | ICD-10-CM | POA: Diagnosis not present

## 2017-03-03 DIAGNOSIS — I130 Hypertensive heart and chronic kidney disease with heart failure and stage 1 through stage 4 chronic kidney disease, or unspecified chronic kidney disease: Secondary | ICD-10-CM | POA: Diagnosis not present

## 2017-03-03 DIAGNOSIS — D631 Anemia in chronic kidney disease: Secondary | ICD-10-CM | POA: Diagnosis not present

## 2017-03-06 DIAGNOSIS — J45909 Unspecified asthma, uncomplicated: Secondary | ICD-10-CM | POA: Diagnosis not present

## 2017-03-06 DIAGNOSIS — J449 Chronic obstructive pulmonary disease, unspecified: Secondary | ICD-10-CM | POA: Diagnosis not present

## 2017-03-06 DIAGNOSIS — S2241XD Multiple fractures of ribs, right side, subsequent encounter for fracture with routine healing: Secondary | ICD-10-CM | POA: Diagnosis not present

## 2017-03-06 DIAGNOSIS — I5032 Chronic diastolic (congestive) heart failure: Secondary | ICD-10-CM | POA: Diagnosis not present

## 2017-03-06 DIAGNOSIS — I130 Hypertensive heart and chronic kidney disease with heart failure and stage 1 through stage 4 chronic kidney disease, or unspecified chronic kidney disease: Secondary | ICD-10-CM | POA: Diagnosis not present

## 2017-03-06 DIAGNOSIS — N183 Chronic kidney disease, stage 3 (moderate): Secondary | ICD-10-CM | POA: Diagnosis not present

## 2017-03-07 ENCOUNTER — Other Ambulatory Visit: Payer: Self-pay | Admitting: Internal Medicine

## 2017-03-07 DIAGNOSIS — Z1321 Encounter for screening for nutritional disorder: Secondary | ICD-10-CM

## 2017-03-07 DIAGNOSIS — I10 Essential (primary) hypertension: Secondary | ICD-10-CM

## 2017-03-07 DIAGNOSIS — Z0000 Encounter for general adult medical examination without abnormal findings: Secondary | ICD-10-CM

## 2017-03-07 DIAGNOSIS — E039 Hypothyroidism, unspecified: Secondary | ICD-10-CM

## 2017-03-08 DIAGNOSIS — N183 Chronic kidney disease, stage 3 (moderate): Secondary | ICD-10-CM | POA: Diagnosis not present

## 2017-03-08 DIAGNOSIS — I130 Hypertensive heart and chronic kidney disease with heart failure and stage 1 through stage 4 chronic kidney disease, or unspecified chronic kidney disease: Secondary | ICD-10-CM | POA: Diagnosis not present

## 2017-03-08 DIAGNOSIS — J45909 Unspecified asthma, uncomplicated: Secondary | ICD-10-CM | POA: Diagnosis not present

## 2017-03-08 DIAGNOSIS — I5032 Chronic diastolic (congestive) heart failure: Secondary | ICD-10-CM | POA: Diagnosis not present

## 2017-03-08 DIAGNOSIS — S2241XD Multiple fractures of ribs, right side, subsequent encounter for fracture with routine healing: Secondary | ICD-10-CM | POA: Diagnosis not present

## 2017-03-08 DIAGNOSIS — J449 Chronic obstructive pulmonary disease, unspecified: Secondary | ICD-10-CM | POA: Diagnosis not present

## 2017-03-12 NOTE — Progress Notes (Signed)
Cardiology Office Note Date:  03/15/2017  Patient ID:  [name], DOB 04/14/1921, MRN [id number] PCP:  Elby Showers, MD  Cardiologist:  Dr. Percival Spanish Electrophysiologist: Dr. Lovena Le   Chief Complaint: annual EP visit  History of Present Illness: [name] is a 83 y.o. female with history of symptomatic bradycardia w/PPM, chronic AFib (hx of flutter ablation), HTN, macular degeneration, hypothyroidism.  She comes today to be seen for Dr. Lovena Le, last seen by him in Oct last year, doing well at that time, though slowing down.  She is doing well, ambulates with a cane, her vision continues to diminish unfortunately, though no falls or issues, she does not drive.  She denies any CP, palpitations or SOB, no cardiac awareness at all.  No dizziness, near syncope or syncope.  She denies any bleeding or signs of bleeding.  Device information: MDT dual chamber PPM, implanted 2005, gen change 12/15/13, Dr. Lovena Le   Past Medical History:  Diagnosis Date  . Anemia   . Asthma   . Atrial fibrillation ([place])   . Atrial flutter ([place])    s/p RFCA  . Diverticulosis   . DJD (degenerative joint disease)    knees  . Esophageal stricture   . GERD (gastroesophageal reflux disease)   . Hearing loss   . HTN (hypertension)    a.  echo 3/06: EF 55-65%, mild LVH, mild RVH;   b.  Echo (06/10/13): EF 50-55%, normal wall motion, MAC, mild MR, mild RPE, PASP 40.  Marland Kitchen Hypothyroidism   . Macular degeneration   . Renal insufficiency   . Symptomatic sinus bradycardia    s/p pacemaker  . Tremor, essential   . Urinary, incontinence, stress female   . Vertigo     Past Surgical History:  Procedure Laterality Date  . PACEMAKER INSERTION    . PERMANENT PACEMAKER GENERATOR CHANGE N/A 12/15/2013   Procedure: PERMANENT PACEMAKER GENERATOR CHANGE;  Surgeon: Evans Lance, MD;  [facility] LAB;  Service: Cardiovascular;  Laterality: N/A;  . radiofrequency catheter ablation    . TRANSTHORACIC  ECHOCARDIOGRAM  06/10/2013   EF 50-55% Mild MR    Current Outpatient Medications  Medication Sig Dispense Refill  . ADVAIR DISKUS 250-50 MCG/DOSE AEPB Inhale 1 puff into the lungs 2 (two) times daily. 90 each 3  . ALPRAZolam (XANAX) 0.5 MG tablet TAKE 1 TABLET BY MOUTH THREE TIMES A DAY AS NEEDED 90 tablet 5  . apixaban (ELIQUIS) 2.5 MG TABS tablet Take 1 tablet (2.5 mg total) by mouth 2 (two) times daily. 180 tablet 1  . Cholecalciferol (VITAMIN D3) 1000 UNITS CAPS Take 1,000 Units by mouth daily.     . cyanocobalamin 500 MCG tablet Take 1 tablet (500 mcg total) by mouth daily.    Marland Kitchen diltiazem (CARDIZEM CD) 120 MG 24 hr capsule TAKE ONE CAPSULE BY MOUTH EVERY DAY 30 capsule 8  . ferrous sulfate 325 (65 FE) MG tablet Take 325 mg by mouth daily with breakfast.      . furosemide (LASIX) 20 MG tablet TAKE 1 TABLET BY MOUTH EVERY DAY 90 tablet 3  . HYDROcodone-acetaminophen (NORCO/VICODIN) 5-325 MG tablet Take 1 tablet by mouth every 6 (six) hours as needed for moderate pain. 12 tablet 0  . KLOR-CON M20 20 MEQ tablet TAKE 1 TABLET BY MOUTH EVERY DAY 30 tablet 6  . levothyroxine (SYNTHROID, LEVOTHROID) 88 MCG tablet TAKE 1 TABLET BY MOUTH EVERY DAY 90 tablet 3  . metoprolol tartrate (LOPRESSOR) 50 MG  tablet Take one (1) tablet (50 mg) by mouth twice daily. Please keep 12-13 at 1:45 pm appointment for more refills thanks. 60 tablet 1  . Multiple Vitamins-Minerals (PRESERVISION AREDS 2) CAPS Take 1 capsule by mouth 2 (two) times daily.     Vladimir Faster Glycol-Propyl Glycol (SYSTANE OP) Place 1 drop into both eyes daily as needed (dry eyes).     No current facility-administered medications for this visit.     Allergies:   Patient has no known allergies.   Social History:  The patient  reports that she quit smoking about 55 years ago. Her smoking use included cigarettes. she has never used smokeless tobacco. She reports that she drinks alcohol. She reports that she does not use drugs.   Family  History:  The patient's family history includes Cancer in her father and mother; Cirrhosis in her brother.  ROS:  Please see the history of present illness.    All other systems are reviewed and otherwise negative.   PHYSICAL EXAM:  VS:  BP (!) 108/58   Pulse 90   Ht 5' 3"  (1.6 m)   Wt 128 lb (58.1 kg)   BMI 22.67 kg/m  BMI: Body mass index is 22.67 kg/m. Well nourished, well developed, in no acute distress  HEENT: normocephalic, atraumatic  Neck: no JVD, carotid bruits or masses Cardiac:  iRRR; soft SM, no rubs, or gallops Lungs: CTA b/l, no wheezing, rhonchi or rales  Abd: soft, nontender MS: no deformity, age appropriate atrophy Ext: no edema, chronic looking skin changes  Skin: warm and dry, no rash Neuro:  No gross deficits appreciated Psych: euthymic mood, full affect  PPM site is stable, no tethering or discomfort   EKG:  Not done today PPM interrogation done today and reviewed by myself: battery and lead measurements are good, numerous HVR episodes, available EGMs are AF, longest 11 minutes, majority are seconds to only a couple minutes, she presents in AFib, 80's today, intermittent V paced beat  Recent Labs: 12/22/2016: B Natriuretic Peptide 379.5 12/23/2016: ALT 11; BUN 23; Creatinine, Ser 1.63; Hemoglobin 11.0; Platelets 266; Potassium 3.8; Sodium 138; TSH 2.656  No results found for requested labs within last 8760 hours.   CrCl cannot be calculated (Patient's most recent lab result is older than the maximum 21 days allowed.).   Wt Readings from Last 3 Encounters:  03/15/17 128 lb (58.1 kg)  12/22/16 128 lb (58.1 kg)  11/21/16 130 lb 12.8 oz (59.3 kg)     Other studies reviewed: Additional studies/records reviewed today include: summarized above  ASSESSMENT AND PLAN:  1. PPM     Intact function  2. Chronic AFib     CHA2DS2Vasc is 4, on Eliquis, appropriately reduced dosed give age, creat  3. HTN     Looks good, no changes   Disposition: F/u with Q  3 month remotes and 1 year EP in-clinic visit.  Current medicines are reviewed at length with the patient today.  The patient did not have any concerns regarding medicines.  Venetia Night, PA-C 03/15/2017 2:36 PM     [address] [place] [id number] [phone] (office)  [phone] (fax)

## 2017-03-14 DIAGNOSIS — J45909 Unspecified asthma, uncomplicated: Secondary | ICD-10-CM | POA: Diagnosis not present

## 2017-03-14 DIAGNOSIS — J449 Chronic obstructive pulmonary disease, unspecified: Secondary | ICD-10-CM | POA: Diagnosis not present

## 2017-03-14 DIAGNOSIS — I5032 Chronic diastolic (congestive) heart failure: Secondary | ICD-10-CM | POA: Diagnosis not present

## 2017-03-14 DIAGNOSIS — I130 Hypertensive heart and chronic kidney disease with heart failure and stage 1 through stage 4 chronic kidney disease, or unspecified chronic kidney disease: Secondary | ICD-10-CM | POA: Diagnosis not present

## 2017-03-14 DIAGNOSIS — N183 Chronic kidney disease, stage 3 (moderate): Secondary | ICD-10-CM | POA: Diagnosis not present

## 2017-03-14 DIAGNOSIS — S2241XD Multiple fractures of ribs, right side, subsequent encounter for fracture with routine healing: Secondary | ICD-10-CM | POA: Diagnosis not present

## 2017-03-15 ENCOUNTER — Ambulatory Visit (INDEPENDENT_AMBULATORY_CARE_PROVIDER_SITE_OTHER): Payer: Medicare Other | Admitting: Physician Assistant

## 2017-03-15 VITALS — BP 108/58 | HR 90 | Ht 63.0 in | Wt 128.0 lb

## 2017-03-15 DIAGNOSIS — I4821 Permanent atrial fibrillation: Secondary | ICD-10-CM

## 2017-03-15 DIAGNOSIS — I482 Chronic atrial fibrillation: Secondary | ICD-10-CM | POA: Diagnosis not present

## 2017-03-15 DIAGNOSIS — I10 Essential (primary) hypertension: Secondary | ICD-10-CM | POA: Diagnosis not present

## 2017-03-15 DIAGNOSIS — I5031 Acute diastolic (congestive) heart failure: Secondary | ICD-10-CM | POA: Diagnosis not present

## 2017-03-15 DIAGNOSIS — Z950 Presence of cardiac pacemaker: Secondary | ICD-10-CM

## 2017-03-15 NOTE — Patient Instructions (Addendum)
Medication Instructions:   Your physician recommends that you continue on your current medications as directed. Please refer to the Current Medication list given to you today.    If you need a refill on your cardiac medications before your next appointment, please call your pharmacy.  Labwork: NONE ORDERED  TODAY    Testing/Procedures: NONE ORDERED  TODAY    Follow-Up: Your physician wants you to follow-up in: [place] will receive a reminder letter in the mail two months in advance. If you don't receive a letter, please call our office to schedule the follow-up appointment.  Remote monitoring is used to monitor your Pacemaker of ICD from home. This monitoring reduces the number of office visits required to check your device to one time per year. It allows [place] to keep an eye on the functioning of your device to ensure it is working properly. You can send your in device check from home on as scheduled You Meyerhoff send your transmission at any time that day. If you have a wireless device, the transmission will be sent automatically. After your physician reviews your transmission, you will receive a postcard with your next transmission date.     Any Other Special Instructions Will Be Listed Below (If Applicable).

## 2017-03-16 DIAGNOSIS — S2241XD Multiple fractures of ribs, right side, subsequent encounter for fracture with routine healing: Secondary | ICD-10-CM | POA: Diagnosis not present

## 2017-03-16 DIAGNOSIS — N183 Chronic kidney disease, stage 3 (moderate): Secondary | ICD-10-CM | POA: Diagnosis not present

## 2017-03-16 DIAGNOSIS — I130 Hypertensive heart and chronic kidney disease with heart failure and stage 1 through stage 4 chronic kidney disease, or unspecified chronic kidney disease: Secondary | ICD-10-CM | POA: Diagnosis not present

## 2017-03-16 DIAGNOSIS — I5032 Chronic diastolic (congestive) heart failure: Secondary | ICD-10-CM | POA: Diagnosis not present

## 2017-03-16 DIAGNOSIS — J449 Chronic obstructive pulmonary disease, unspecified: Secondary | ICD-10-CM | POA: Diagnosis not present

## 2017-03-16 DIAGNOSIS — J45909 Unspecified asthma, uncomplicated: Secondary | ICD-10-CM | POA: Diagnosis not present

## 2017-03-19 ENCOUNTER — Ambulatory Visit (INDEPENDENT_AMBULATORY_CARE_PROVIDER_SITE_OTHER): Payer: Medicare Other | Admitting: Internal Medicine

## 2017-03-19 ENCOUNTER — Encounter: Payer: Self-pay | Admitting: Internal Medicine

## 2017-03-19 VITALS — BP 110/70 | HR 87 | Ht 63.5 in | Wt 128.0 lb

## 2017-03-19 DIAGNOSIS — G250 Essential tremor: Secondary | ICD-10-CM

## 2017-03-19 DIAGNOSIS — Z8679 Personal history of other diseases of the circulatory system: Secondary | ICD-10-CM

## 2017-03-19 DIAGNOSIS — R5383 Other fatigue: Secondary | ICD-10-CM | POA: Diagnosis not present

## 2017-03-19 DIAGNOSIS — K219 Gastro-esophageal reflux disease without esophagitis: Secondary | ICD-10-CM | POA: Diagnosis not present

## 2017-03-19 DIAGNOSIS — N183 Chronic kidney disease, stage 3 unspecified: Secondary | ICD-10-CM

## 2017-03-19 DIAGNOSIS — H903 Sensorineural hearing loss, bilateral: Secondary | ICD-10-CM | POA: Diagnosis not present

## 2017-03-19 DIAGNOSIS — E039 Hypothyroidism, unspecified: Secondary | ICD-10-CM

## 2017-03-19 DIAGNOSIS — Z1321 Encounter for screening for nutritional disorder: Secondary | ICD-10-CM | POA: Diagnosis not present

## 2017-03-19 DIAGNOSIS — I480 Paroxysmal atrial fibrillation: Secondary | ICD-10-CM | POA: Diagnosis not present

## 2017-03-19 DIAGNOSIS — I5032 Chronic diastolic (congestive) heart failure: Secondary | ICD-10-CM

## 2017-03-19 DIAGNOSIS — Z0000 Encounter for general adult medical examination without abnormal findings: Secondary | ICD-10-CM

## 2017-03-19 DIAGNOSIS — H3530 Unspecified macular degeneration: Secondary | ICD-10-CM | POA: Diagnosis not present

## 2017-03-19 DIAGNOSIS — Z950 Presence of cardiac pacemaker: Secondary | ICD-10-CM

## 2017-03-19 DIAGNOSIS — Z7901 Long term (current) use of anticoagulants: Secondary | ICD-10-CM | POA: Diagnosis not present

## 2017-03-19 DIAGNOSIS — I10 Essential (primary) hypertension: Secondary | ICD-10-CM

## 2017-03-19 DIAGNOSIS — F419 Anxiety disorder, unspecified: Secondary | ICD-10-CM

## 2017-03-19 LAB — POCT URINALYSIS DIPSTICK
APPEARANCE: NORMAL
Bilirubin, UA: NEGATIVE
Blood, UA: NEGATIVE
GLUCOSE UA: NEGATIVE
Ketones, UA: NEGATIVE
LEUKOCYTES UA: NEGATIVE
Nitrite, UA: NEGATIVE
Odor: NORMAL
Protein, UA: NEGATIVE
SPEC GRAV UA: 1.015 (ref 1.010–1.025)
Urobilinogen, UA: 0.2 E.U./dL
pH, UA: 6 (ref 5.0–8.0)

## 2017-03-19 NOTE — Addendum Note (Signed)
Addended by: Mady Haagensen on: 03/19/2017 11:57 AM   Modules accepted: Orders

## 2017-03-19 NOTE — Progress Notes (Signed)
Subjective:    Patient ID: [name], female    DOB: [birth year], 83 y.o.   MRN: [id number]  HPI 83 year old Female  for Medicare wellness exam, health maintenance exam and evaluation of medical issues. Weight is same as September.    She was hospitalized for 24 hours in September with atrial fibrillation and shortness of breath.  She was placed on home oxygen.  She remains on chronic anticoagulation.  She has a history of hypothyroidism chronic kidney disease stage III, asthma/COPD.  At the time of her admission she had had a mechanical fall 2 weeks prior to admission and was diagnosed with broken ribs and contusion of the chest wall.  She had become progressively short of breath after that.  In the emergency department VQ scan was done which was negative for pulmonary embolus.  Chest x-ray showed small pleural effusion.  She was treated with pain control incentive spirometry and oxygen.  She was given 1 dose of IV Lasix.  She apparently uses her oxygen fairly frequently to get around her apartment.  Lives in apartment and [place].  She resides alone.  Her daughter checks on her fairly frequently.  She had an esophageal stricture dilated in 1996.  Long-standing history of GE reflux.  Chronic atrial fibrillation.  She has a pacemaker.  She ambulates with a cane.  Had acute diastolic heart failure in 2015 precipitated by atrial fibrillation with rapid ventricular response.  History of hypertension.  Pacemaker was placed for sick sinus syndrome in 2005.  Medical issues include stress urinary incontinence, osteoarthritis, chronic kidney disease, hearing loss in both ears, history of anemia.  History of anxiety, history of Schatzki's ring dilated 1992, hypothyroidism, essential tremor that started in the early 1990s.  Colonoscopy done in 2001 and not repeated due to age  Cataract extraction right eye 1992 and left eye 1993  History of right middle lobe pneumonia 1996.  Social history: She  is a widow.  She is a retired Chief Technology Officer.  She has maintained good physical conditioning.  Formally smoked a pack of cigarettes daily for some 5 years but quit in the 1960s.  Daughter resides in [place].  Son resides in [place].  For time weight loss had been an issue.  She weighed 135.5 pounds in June 2015 and in June 2016 was 125 pounds.  Weight seems to be stable at the present time and she has no GI complaints.  We talked about her dietary history.  Appetite is fair.  She basically snacks some but has at least one good meal a day.  Family history: Mother died at age 67 due to acute leukemia.  Mother died at age 79 of kidney failure.  One brother died at age 73 of cirrhosis of the liver.  One sister.      Review of Systems she wears bilateral hearing aids.  No new complaints.  Affect is bright today.  Sometimes she is depressed    Objective:   Physical Exam  Constitutional: She is oriented to person, place, and time. She appears well-developed. No distress.  HENT:  Head: Normocephalic and atraumatic.  Right Ear: External ear normal.  Left Ear: External ear normal.  Mouth/Throat: No oropharyngeal exudate.  Hearing loss in both ears  Eyes: Conjunctivae and EOM are normal. Pupils are equal, round, and reactive to light. No scleral icterus.  Neck: Neck supple. No JVD present. No thyromegaly present.  Cardiovascular: Normal rate and intact distal pulses.  No murmur  heard. Pulmonary/Chest: No respiratory distress. She has no wheezes. She has no rales. She exhibits no tenderness.  Abdominal: Soft. Bowel sounds are normal. She exhibits no distension and no mass. There is no tenderness. There is no rebound and no guarding.  Genitourinary:  Genitourinary Comments: Deferred due to age  Musculoskeletal: She exhibits no edema.  Lymphadenopathy:    She has no cervical adenopathy.  Neurological: She is alert and oriented to person, place, and time. She has normal reflexes. No  cranial nerve deficit. Coordination normal.  Skin: Skin is warm and dry. No rash noted. She is not diaphoretic.  Psychiatric: She has a normal mood and affect. Her behavior is normal. Judgment and thought content normal.  Vitals reviewed.         Assessment & Plan:  Paroxysmal atrial fibrillation on chronic anticoagulation  History of congestive heart failure-acute diastolic heart failure  History of sick sinus syndrome requiring pacemaker placement  Essential tremor  Bilateral hearing loss  Hypothyroidism  Chronic kidney disease  Recent fall with fractured ribs in September  History of benign positional vertigo  Macular degeneration  Anxiety  Essential hypertension-stable  Plan: Patient will continue same medications and return in 6 months.  Subjective:   Patient presents for Medicare Annual/Subsequent preventive examination.  Review Past Medical/Family/Social:   Risk Factors  Current exercise habits:  Dietary issues discussed:   Cardiac risk factors:  Depression Screen  (Note: if answer to either of the following is "Yes", a more complete depression screening is indicated)   Over the past two weeks, have you felt down, depressed or hopeless? No  Over the past two weeks, have you felt little interest or pleasure in doing things? No Have you lost interest or pleasure in daily life? No Do you often feel hopeless? No Do you cry easily over simple problems? No   Activities of Daily Living  In your present state of health, do you have any difficulty performing the following activities?:   Driving? No  Managing money? No  Feeding yourself? No  Getting from bed to chair? No  Climbing a flight of stairs? No  Preparing food and eating?: yes-get fatigued and have appetite issues Bathing or showering? No  Getting dressed: No  Getting to the toilet? No  Using the toilet:No  Moving around from place to place: No  In the past year have you fallen or had a  near fall?yes Are you sexually active? No  Do you have more than one partner? No   Hearing Difficulties: Yes I wear hearing aids Do you often ask people to speak up or repeat themselves?  Yes Do you experience ringing or noises in your ears? No  Do you have difficulty understanding soft or whispered voices?  Yes I wear hearing aids Do you feel that you have a problem with memory? yes Do you often misplace items? yes   Home Safety:  Do you have a smoke alarm at your residence? Yes Do you have grab bars in the bathroom?  Yes Do you have throw rugs in your house?  no   Cognitive Testing  Alert? Yes Normal Appearance?Yes  Oriented to person? Yes Place? Yes  Time? Yes  Recall of three objects? Yes  Can perform simple calculations? Yes  Displays appropriate judgment?Yes  Can read the correct time from a watch face?Yes   List the Names of Other Physician/Practitioners you currently use:  See referral list for the physicians patient is currently seeing.  Cardiologist  Review of Systems: See above   Objective:     General appearance: Appears stated age and mildly obese  Head: Normocephalic, without obvious abnormality, atraumatic  Eyes: conj clear, EOMi PEERLA  Ears: normal TM's and external ear canals both ears  Nose: Nares normal. Septum midline. Mucosa normal. No drainage or sinus tenderness.  Throat: lips, mucosa, and tongue normal; teeth and gums normal  Neck: no adenopathy, no carotid bruit, no JVD, supple, symmetrical, trachea midline and thyroid not enlarged, symmetric, no tenderness/mass/nodules  No CVA tenderness.  Lungs: clear to auscultation bilaterally  Breasts: normal appearance, no masses or tenderness Heart: regular rate and rhythm, S1, S2 normal, no murmur, click, rub or gallop  Abdomen: soft, non-tender; bowel sounds normal; no masses, no organomegaly  Musculoskeletal: ROM normal in all joints, no crepitus, no deformity, Normal muscle strengthen. Back  is  symmetric, no curvature. Skin: Skin color, texture, turgor normal. No rashes or lesions  Lymph nodes: Cervical, supraclavicular, and axillary nodes normal.  Neurologic: CN 2 -12 Normal, Normal symmetric reflexes. Normal coordination and gait  Psych: Alert & Oriented x 3, Mood appear stable.    Assessment:    Annual wellness medicare exam   Plan:    During the course of the visit the patient was educated and counseled about appropriate screening and preventive services including:        Patient Instructions (the written plan) was given to the patient.  Medicare Attestation  I have personally reviewed:  The patient's medical and social history  Their use of alcohol, tobacco or illicit drugs  Their current medications and supplements  The patient's functional ability including ADLs,fall risks, home safety risks, cognitive, and hearing and visual impairment  Diet and physical activities  Evidence for depression or mood disorders  The patient's weight, height, BMI, and visual acuity have been recorded in the chart. I have made referrals, counseling, and provided education to the patient based on review of the above and I have provided the patient with a written personalized care plan for preventive services.       GE reflux

## 2017-03-19 NOTE — Addendum Note (Signed)
Addended by: Mady Haagensen on: 03/19/2017 12:03 PM   Modules accepted: Orders

## 2017-03-19 NOTE — Addendum Note (Signed)
Addended by: Mady Haagensen on: 03/19/2017 12:02 PM   Modules accepted: Orders

## 2017-03-20 LAB — CBC WITH DIFFERENTIAL/PLATELET
Basophils Absolute: 103 cells/uL (ref 0–200)
Basophils Relative: 1.3 %
EOS PCT: 3.4 %
Eosinophils Absolute: 269 cells/uL (ref 15–500)
HCT: 34.8 % — ABNORMAL LOW (ref 35.0–45.0)
Hemoglobin: 11.4 g/dL — ABNORMAL LOW (ref 11.7–15.5)
Lymphs Abs: 1635 cells/uL (ref 850–3900)
MCH: 31.8 pg (ref 27.0–33.0)
MCHC: 32.8 g/dL (ref 32.0–36.0)
MCV: 96.9 fL (ref 80.0–100.0)
MONOS PCT: 10.1 %
MPV: 10.9 fL (ref 7.5–12.5)
NEUTROS PCT: 64.5 %
Neutro Abs: 5096 cells/uL (ref 1500–7800)
PLATELETS: 215 10*3/uL (ref 140–400)
RBC: 3.59 10*6/uL — AB (ref 3.80–5.10)
RDW: 12.3 % (ref 11.0–15.0)
TOTAL LYMPHOCYTE: 20.7 %
WBC mixed population: 798 cells/uL (ref 200–950)
WBC: 7.9 10*3/uL (ref 3.8–10.8)

## 2017-03-20 LAB — LIPID PANEL
CHOLESTEROL: 149 mg/dL (ref <200)
HDL: 54 mg/dL (ref >50)
LDL Cholesterol (Calc): 76 mg/dL (calc)
Non-HDL Cholesterol (Calc): 95 mg/dL (calc) (ref <130)
TRIGLYCERIDES: 105 mg/dL (ref <150)
Total CHOL/HDL Ratio: 2.8 (calc) (ref <5.0)

## 2017-03-20 LAB — COMPLETE METABOLIC PANEL WITH GFR
AG Ratio: 1.6 (calc) (ref 1.0–2.5)
ALBUMIN MSPROF: 3.8 g/dL (ref 3.6–5.1)
ALT: 7 U/L (ref 6–29)
AST: 12 U/L (ref 10–35)
Alkaline phosphatase (APISO): 78 U/L (ref 33–130)
BILIRUBIN TOTAL: 0.7 mg/dL (ref 0.2–1.2)
BUN / CREAT RATIO: 18 (calc) (ref 6–22)
BUN: 25 mg/dL (ref 7–25)
CHLORIDE: 106 mmol/L (ref 98–110)
CO2: 26 mmol/L (ref 20–32)
Calcium: 9.5 mg/dL (ref 8.6–10.4)
Creat: 1.41 mg/dL — ABNORMAL HIGH (ref 0.60–0.88)
GFR, EST AFRICAN AMERICAN: 37 mL/min/{1.73_m2} — AB (ref >=60)
GFR, Est Non African American: 32 mL/min/{1.73_m2} — ABNORMAL LOW (ref >=60)
GLUCOSE: 98 mg/dL (ref 65–99)
Globulin: 2.4 g/dL (calc) (ref 1.9–3.7)
Potassium: 4.7 mmol/L (ref 3.5–5.3)
Sodium: 139 mmol/L (ref 135–146)
TOTAL PROTEIN: 6.2 g/dL (ref 6.1–8.1)

## 2017-03-20 LAB — TSH: TSH: 2.59 m[IU]/L (ref 0.40–4.50)

## 2017-03-20 LAB — VITAMIN D 25 HYDROXY (VIT D DEFICIENCY, FRACTURES): Vit D, 25-Hydroxy: 39 ng/mL (ref 30–100)

## 2017-03-23 DIAGNOSIS — I130 Hypertensive heart and chronic kidney disease with heart failure and stage 1 through stage 4 chronic kidney disease, or unspecified chronic kidney disease: Secondary | ICD-10-CM | POA: Diagnosis not present

## 2017-03-23 DIAGNOSIS — S2241XD Multiple fractures of ribs, right side, subsequent encounter for fracture with routine healing: Secondary | ICD-10-CM | POA: Diagnosis not present

## 2017-03-23 DIAGNOSIS — I5032 Chronic diastolic (congestive) heart failure: Secondary | ICD-10-CM | POA: Diagnosis not present

## 2017-03-23 DIAGNOSIS — J45909 Unspecified asthma, uncomplicated: Secondary | ICD-10-CM | POA: Diagnosis not present

## 2017-03-23 DIAGNOSIS — N183 Chronic kidney disease, stage 3 (moderate): Secondary | ICD-10-CM | POA: Diagnosis not present

## 2017-03-23 DIAGNOSIS — J449 Chronic obstructive pulmonary disease, unspecified: Secondary | ICD-10-CM | POA: Diagnosis not present

## 2017-03-26 ENCOUNTER — Emergency Department (INDEPENDENT_AMBULATORY_CARE_PROVIDER_SITE_OTHER)
Admission: EM | Admit: 2017-03-26 | Discharge: 2017-03-26 | Disposition: A | Discharge status: Home / Self Care | Payer: Medicare Other | Source: Home / Self Care | Attending: Family Medicine | Admitting: Family Medicine

## 2017-03-26 ENCOUNTER — Encounter: Payer: Self-pay | Admitting: Emergency Medicine

## 2017-03-26 DIAGNOSIS — H6123 Impacted cerumen, bilateral: Secondary | ICD-10-CM | POA: Diagnosis not present

## 2017-03-26 MED ORDER — CARBAMIDE PEROXIDE 6.5 % OT SOLN: 5.0000 [drp] | Freq: Two times a day (BID) | OTIC | 0 refills | Status: DC | PRN

## 2017-03-26 NOTE — ED Provider Notes (Signed)
[facility]    CSN: [id number] Arrival date & time: 03/26/17  1131     History   Chief Complaint Chief Complaint  Patient presents with  . Cerumen Impaction    HPI [name] is a 83 y.o. female.   HPI [name] is a 83 y.o. female presenting to UC with daughter c/o bilateral ear fullness, worse on Left side. She does wear hearing aids and has hx of cerumen impaction. Requesting ears be checked to see if they need to be irrigated.  Denies other symptoms of cough, congestion, sore throat, HA or fever.    Past Medical History:  Diagnosis Date  . Anemia   . Asthma   . Atrial fibrillation ([place])   . Atrial flutter ([place])    s/p RFCA  . Diverticulosis   . DJD (degenerative joint disease)    knees  . Esophageal stricture   . GERD (gastroesophageal reflux disease)   . Hearing loss   . HTN (hypertension)    a.  echo 3/06: EF 55-65%, mild LVH, mild RVH;   b.  Echo (06/10/13): EF 50-55%, normal wall motion, MAC, mild MR, mild RPE, PASP 40.  Marland Kitchen Hypothyroidism   . Macular degeneration   . Renal insufficiency   . Symptomatic sinus bradycardia    s/p pacemaker  . Tremor, essential   . Urinary, incontinence, stress female   . Vertigo     Patient Active Problem List   Diagnosis Date Noted  . Rib fracture 12/22/2016  . Right rib fracture 12/22/2016  . Atrial fibrillation ([place]) 05/05/2015  . Acute diastolic heart failure ([place]) 06/16/2013    Class: Hospitalized for  . Pneumonia, organism unspecified; Question of 06/16/2013    Class: Question of  . Dyspnea on exertion 06/09/2013  . Acute diastolic congestive heart failure ([place]) 06/09/2013  . Hypertensive cardiovascular disease 06/09/2013  . Macular degeneration 06/09/2013  . Palpitations 05/13/2013  . Chronic renal insufficiency, stage III (moderate) ([place]) 08/30/2012  . SUI (stress urinary incontinence, female) 11/29/2010  . Hypothyroidism 11/29/2010  . Hearing loss of both ears 11/29/2010  . Anemia  11/29/2010  . Benign positional vertigo 09/30/2010  . Primary osteoarthritis of both knees 09/30/2010  . Benign essential tremor 09/30/2010  . Long term current use of anticoagulant 06/01/2010  . BRADYCARDIA 12/15/2008  . CARDIAC PACEMAKER IN SITU- MDT 2005 12/15/2008  . Essential hypertension 09/17/2008  . ASTHMA 09/17/2008  . GERD 09/17/2008    Past Surgical History:  Procedure Laterality Date  . PACEMAKER INSERTION    . PERMANENT PACEMAKER GENERATOR CHANGE N/A 12/15/2013   Procedure: PERMANENT PACEMAKER GENERATOR CHANGE;  Surgeon: Evans Lance, MD;  Location: [facility];  Service: Cardiovascular;  Laterality: N/A;  . radiofrequency catheter ablation    . TRANSTHORACIC ECHOCARDIOGRAM  06/10/2013   EF 50-55% Mild MR    OB History    No data available       Home Medications    Prior to Admission medications   Medication Sig Start Date End Date Taking? Authorizing Provider  ADVAIR DISKUS 250-50 MCG/DOSE AEPB Inhale 1 puff into the lungs 2 (two) times daily. 03/02/17   Elby Showers, MD  ALPRAZolam Duanne Moron) 0.5 MG tablet TAKE 1 TABLET BY MOUTH THREE TIMES A DAY AS NEEDED 02/12/17   Elby Showers, MD  apixaban (ELIQUIS) 2.5 MG TABS tablet Take 1 tablet (2.5 mg total) by mouth 2 (two) times daily. 05/16/16   Minus Breeding, MD  carbamide peroxide (  DEBROX) 6.5 % OTIC solution Place 5 drops into both ears 2 (two) times daily as needed. 03/26/17   Noe Gens, PA-C  Cholecalciferol (VITAMIN D3) 1000 UNITS CAPS Take 1,000 Units by mouth daily.     [provider]  cyanocobalamin 500 MCG tablet Take 1 tablet (500 mcg total) by mouth daily. 12/23/16   Patrecia Pour, Christean Grief, MD  diltiazem (CARDIZEM CD) 120 MG 24 hr capsule TAKE ONE CAPSULE BY MOUTH EVERY DAY 02/08/17   Minus Breeding, MD  ferrous sulfate 325 (65 FE) MG tablet Take 325 mg by mouth daily with breakfast.      [provider]  furosemide (LASIX) 20 MG tablet TAKE 1 TABLET BY MOUTH EVERY DAY 11/30/16    Minus Breeding, MD  HYDROcodone-acetaminophen (NORCO/VICODIN) 5-325 MG tablet Take 1 tablet by mouth every 6 (six) hours as needed for moderate pain. 12/23/16   Patrecia Pour, Christean Grief, MD  KLOR-CON M20 20 MEQ tablet TAKE 1 TABLET BY MOUTH EVERY DAY 02/08/17   Minus Breeding, MD  levothyroxine (SYNTHROID, LEVOTHROID) 88 MCG tablet TAKE 1 TABLET BY MOUTH EVERY DAY 02/26/16   Elby Showers, MD  metoprolol tartrate (LOPRESSOR) 50 MG tablet Take one (1) tablet (50 mg) by mouth twice daily. Please keep 12-13 at 1:45 pm appointment for more refills thanks. 02/12/17   Evans Lance, MD  Multiple Vitamins-Minerals (PRESERVISION AREDS 2) CAPS Take 1 capsule by mouth 2 (two) times daily.     [provider]  Polyethyl Glycol-Propyl Glycol (SYSTANE OP) Place 1 drop into both eyes daily as needed (dry eyes).    [provider]    Family History Family History  Problem Relation Age of Onset  . Cancer Mother        breast  . Cancer Father        leukemia  . Cirrhosis Brother        deceased    Social History Social History   Tobacco Use  . Smoking status: Former Smoker    Types: Cigarettes    Last attempt to quit: 02/04/1962    Years since quitting: 55.1  . Smokeless tobacco: Never Used  Substance Use Topics  . Alcohol use: Yes    Comment: rarely  . Drug use: No     Allergies   Patient has no known allergies.   Review of Systems Review of Systems  Constitutional: Negative for chills and fever.  HENT: Positive for ear pain ( bilateral fullness, worse on Left side) and hearing loss. Negative for congestion and sore throat.   Respiratory: Negative for cough.   Neurological: Negative for dizziness, light-headedness and headaches.     Physical Exam Triage Vital Signs ED Triage Vitals [03/26/17 1155]  Enc Vitals Group     BP 124/75     Pulse Rate 80     Resp      Temp 97.6 F (36.4 C)     Temp Source Oral     SpO2 90 %     Weight 127 lb 8 oz (57.8 kg)      Height _0  (1.626 m)     Head Circumference      Peak Flow      Pain Score 0     Pain Loc      Pain Edu?      Excl. in [place]?    No data found.  Updated Vital Signs BP 124/75 (BP Location: Right Arm)   Pulse 80   Temp  97.6 F (36.4 C) (Oral)   Ht _0  (1.626 m)   Wt 127 lb 8 oz (57.8 kg)   SpO2 90%   BMI 21.89 kg/m   Visual Acuity Right Eye Distance:   Left Eye Distance:   Bilateral Distance:    Right Eye Near:   Left Eye Near:    Bilateral Near:     Physical Exam  Constitutional: She is oriented to person, place, and time. She appears well-developed and well-nourished. No distress.  HENT:  Head: Normocephalic and atraumatic.  Right Ear: Tympanic membrane normal.  Left Ear: Tympanic membrane normal.  Nose: Nose normal.  Mouth/Throat: Uvula is midline, oropharynx is clear and moist and mucous membranes are normal.  Small cerumen impaction on both sides. Ear canals are curvy and narrow.  TMs normal   Eyes: EOM are normal.  Neck: Normal range of motion.  Cardiovascular: Normal rate.  Pulmonary/Chest: Effort normal. No respiratory distress.  Musculoskeletal: Normal range of motion.  Neurological: She is alert and oriented to person, place, and time.  Skin: Skin is warm and dry. She is not diaphoretic.  Psychiatric: She has a normal mood and affect. Her behavior is normal.  Nursing note and vitals reviewed.    UC Treatments / Results  Labs (all labs ordered are listed, but only abnormal results are displayed) Labs Reviewed - No data to display  EKG  EKG Interpretation None       Radiology No results found.  Procedures Procedures (including critical care time)  Medications Ordered in UC Medications - No data to display   Initial Impression / Assessment and Plan / UC Course  I have reviewed the triage vital signs and the nursing notes.  Pertinent labs & imaging results that were available during my care of the patient were reviewed by me and  considered in my medical decision making (see chart for details).     Cerumen removal performed by Baxter Flattery, CMA No evidence of underlying infection [place] try Debrox to help prevent buildup of cerumen F/u with PCP as needed.   Final Clinical Impressions(s) / UC Diagnoses   Final diagnoses:  Hearing loss due to cerumen impaction, bilateral    ED Discharge Orders        Ordered    carbamide peroxide (DEBROX) 6.5 % OTIC solution  2 times daily PRN     03/26/17 1253       Controlled Substance Prescriptions [place] Controlled Substance Registry consulted? Not Applicable   Tyrell Antonio 03/26/17 1306

## 2017-03-26 NOTE — ED Triage Notes (Signed)
Patient is requesting that her ears be checked to see if they need irrigating.

## 2017-03-28 DIAGNOSIS — N183 Chronic kidney disease, stage 3 (moderate): Secondary | ICD-10-CM | POA: Diagnosis not present

## 2017-03-28 DIAGNOSIS — I5032 Chronic diastolic (congestive) heart failure: Secondary | ICD-10-CM | POA: Diagnosis not present

## 2017-03-28 DIAGNOSIS — I130 Hypertensive heart and chronic kidney disease with heart failure and stage 1 through stage 4 chronic kidney disease, or unspecified chronic kidney disease: Secondary | ICD-10-CM | POA: Diagnosis not present

## 2017-03-28 DIAGNOSIS — J45909 Unspecified asthma, uncomplicated: Secondary | ICD-10-CM | POA: Diagnosis not present

## 2017-03-28 DIAGNOSIS — J449 Chronic obstructive pulmonary disease, unspecified: Secondary | ICD-10-CM | POA: Diagnosis not present

## 2017-03-28 DIAGNOSIS — S2241XD Multiple fractures of ribs, right side, subsequent encounter for fracture with routine healing: Secondary | ICD-10-CM | POA: Diagnosis not present

## 2017-03-30 DIAGNOSIS — J45909 Unspecified asthma, uncomplicated: Secondary | ICD-10-CM | POA: Diagnosis not present

## 2017-03-30 DIAGNOSIS — I130 Hypertensive heart and chronic kidney disease with heart failure and stage 1 through stage 4 chronic kidney disease, or unspecified chronic kidney disease: Secondary | ICD-10-CM | POA: Diagnosis not present

## 2017-03-30 DIAGNOSIS — S2241XD Multiple fractures of ribs, right side, subsequent encounter for fracture with routine healing: Secondary | ICD-10-CM | POA: Diagnosis not present

## 2017-03-30 DIAGNOSIS — N183 Chronic kidney disease, stage 3 (moderate): Secondary | ICD-10-CM | POA: Diagnosis not present

## 2017-03-30 DIAGNOSIS — I5032 Chronic diastolic (congestive) heart failure: Secondary | ICD-10-CM | POA: Diagnosis not present

## 2017-03-30 DIAGNOSIS — J449 Chronic obstructive pulmonary disease, unspecified: Secondary | ICD-10-CM | POA: Diagnosis not present

## 2017-04-02 ENCOUNTER — Other Ambulatory Visit: Payer: Self-pay | Admitting: *Deleted

## 2017-04-02 MED ORDER — METOPROLOL TARTRATE 50 MG PO TABS: 50.0000 mg | ORAL_TABLET | Freq: Two times a day (BID) | ORAL | 3 refills | Status: DC

## 2017-04-28 ENCOUNTER — Encounter: Payer: Self-pay | Admitting: Internal Medicine

## 2017-04-28 NOTE — Patient Instructions (Signed)
It was a pleasure to see you today.  Continue same medications and return in 6 months. 

## 2017-05-09 ENCOUNTER — Ambulatory Visit (INDEPENDENT_AMBULATORY_CARE_PROVIDER_SITE_OTHER): Payer: Medicare Other | Admitting: *Deleted

## 2017-05-09 ENCOUNTER — Telehealth: Payer: Self-pay | Admitting: Cardiology

## 2017-05-09 DIAGNOSIS — I495 Sick sinus syndrome: Secondary | ICD-10-CM | POA: Diagnosis not present

## 2017-05-09 NOTE — Telephone Encounter (Signed)
Spoke with pt and reminded pt of remote transmission that is due today. Pt verbalized understanding.   

## 2017-05-09 NOTE — Progress Notes (Signed)
Remote pacemaker transmission.   

## 2017-05-10 ENCOUNTER — Encounter: Payer: Self-pay | Admitting: Cardiology

## 2017-05-31 LAB — CUP PACEART REMOTE DEVICE CHECK
Battery Impedance: 302 Ohm
Battery Remaining Longevity: 113 mo
Brady Statistic RV Percent Paced: 18 %
Implantable Lead Implant Date: 20051017
Implantable Lead Location: 753860
Implantable Lead Model: 5076
Implantable Lead Model: 5076
Implantable Pulse Generator Implant Date: 20150914
Lead Channel Impedance Value: 490 Ohm
Lead Channel Impedance Value: 67 Ohm
Lead Channel Pacing Threshold Amplitude: 0.875 V
Lead Channel Setting Sensing Sensitivity: 4 mV
MDC IDC LEAD IMPLANT DT: 20051017
MDC IDC LEAD LOCATION: 753859
MDC IDC MSMT BATTERY VOLTAGE: 2.79 V
MDC IDC MSMT LEADCHNL RV PACING THRESHOLD PULSEWIDTH: 0.4 ms
MDC IDC SESS DTM: 20190206195942
MDC IDC SET LEADCHNL RV PACING AMPLITUDE: 2.5 V
MDC IDC SET LEADCHNL RV PACING PULSEWIDTH: 0.4 ms

## 2017-06-16 ENCOUNTER — Other Ambulatory Visit: Payer: Self-pay | Admitting: Internal Medicine

## 2017-06-18 ENCOUNTER — Other Ambulatory Visit: Payer: Self-pay

## 2017-06-18 ENCOUNTER — Other Ambulatory Visit: Payer: Self-pay | Admitting: Cardiology

## 2017-06-18 MED ORDER — LEVOTHYROXINE SODIUM 88 MCG PO TABS: 88.0000 ug | ORAL_TABLET | Freq: Every day | ORAL | 1 refills | Status: AC

## 2017-07-06 ENCOUNTER — Other Ambulatory Visit: Payer: Self-pay | Admitting: Cardiology

## 2017-07-12 ENCOUNTER — Other Ambulatory Visit: Payer: Self-pay | Admitting: Cardiology

## 2017-07-12 NOTE — Telephone Encounter (Signed)
REFILL 

## 2017-08-08 ENCOUNTER — Telehealth: Payer: Self-pay | Admitting: Cardiology

## 2017-08-08 ENCOUNTER — Ambulatory Visit (INDEPENDENT_AMBULATORY_CARE_PROVIDER_SITE_OTHER): Payer: Medicare Other | Admitting: *Deleted

## 2017-08-08 DIAGNOSIS — I495 Sick sinus syndrome: Secondary | ICD-10-CM | POA: Diagnosis not present

## 2017-08-08 NOTE — Telephone Encounter (Signed)
Spoke with pt and reminded pt of remote transmission that is due today. Pt verbalized understanding.   

## 2017-08-09 ENCOUNTER — Encounter: Payer: Self-pay | Admitting: Cardiology

## 2017-08-09 NOTE — Progress Notes (Signed)
Remote pacemaker transmission.   

## 2017-08-18 ENCOUNTER — Other Ambulatory Visit: Payer: Self-pay | Admitting: Internal Medicine

## 2017-08-19 NOTE — Telephone Encounter (Signed)
Refill x 6 months 

## 2017-08-23 ENCOUNTER — Telehealth: Payer: Self-pay | Admitting: Internal Medicine

## 2017-08-23 NOTE — Telephone Encounter (Signed)
Has she tried calling this MD. We need a name.

## 2017-08-23 NOTE — Telephone Encounter (Signed)
CB and spoke with Curt Bears and let her know Dr Renold Genta would be glad to do referral but she needs a name of Doctor that you are wanting her to go to.

## 2017-08-23 NOTE — Telephone Encounter (Signed)
Doran Clay Daughter [phone]  Curt Bears called to see if you could do a referral for her mom to go to an ENT in [place] for Ears being stopped up and trouble hearing even with hearing aids for the last couple of days. She would also like for this to be an afternoon appointment because she works in [employer].

## 2017-09-04 LAB — CUP PACEART REMOTE DEVICE CHECK
Battery Remaining Longevity: 109 mo
Battery Voltage: 2.79 V
Date Time Interrogation Session: 20190508181233
Implantable Lead Implant Date: 20051017
Implantable Lead Location: 753859
Implantable Lead Model: 5076
Implantable Lead Model: 5076
Implantable Pulse Generator Implant Date: 20150914
Lead Channel Impedance Value: 462 Ohm
Lead Channel Pacing Threshold Amplitude: 0.875 V
Lead Channel Pacing Threshold Pulse Width: 0.4 ms
MDC IDC LEAD IMPLANT DT: 20051017
MDC IDC LEAD LOCATION: 753860
MDC IDC MSMT BATTERY IMPEDANCE: 327 Ohm
MDC IDC MSMT LEADCHNL RA IMPEDANCE VALUE: 67 Ohm
MDC IDC SET LEADCHNL RV PACING AMPLITUDE: 2.5 V
MDC IDC SET LEADCHNL RV PACING PULSEWIDTH: 0.4 ms
MDC IDC SET LEADCHNL RV SENSING SENSITIVITY: 4 mV
MDC IDC STAT BRADY RV PERCENT PACED: 18 %

## 2017-09-06 DIAGNOSIS — H6123 Impacted cerumen, bilateral: Secondary | ICD-10-CM | POA: Diagnosis not present

## 2017-09-06 DIAGNOSIS — H905 Unspecified sensorineural hearing loss: Secondary | ICD-10-CM | POA: Diagnosis not present

## 2017-09-17 ENCOUNTER — Other Ambulatory Visit: Payer: Self-pay | Admitting: Internal Medicine

## 2017-09-17 DIAGNOSIS — H3530 Unspecified macular degeneration: Secondary | ICD-10-CM

## 2017-09-17 DIAGNOSIS — N183 Chronic kidney disease, stage 3 unspecified: Secondary | ICD-10-CM

## 2017-09-17 DIAGNOSIS — I5032 Chronic diastolic (congestive) heart failure: Secondary | ICD-10-CM

## 2017-09-17 DIAGNOSIS — K219 Gastro-esophageal reflux disease without esophagitis: Secondary | ICD-10-CM

## 2017-09-17 DIAGNOSIS — F419 Anxiety disorder, unspecified: Secondary | ICD-10-CM

## 2017-09-17 DIAGNOSIS — Z8679 Personal history of other diseases of the circulatory system: Secondary | ICD-10-CM

## 2017-09-17 DIAGNOSIS — E039 Hypothyroidism, unspecified: Secondary | ICD-10-CM

## 2017-09-17 DIAGNOSIS — I10 Essential (primary) hypertension: Secondary | ICD-10-CM

## 2017-09-17 DIAGNOSIS — G250 Essential tremor: Secondary | ICD-10-CM

## 2017-09-18 ENCOUNTER — Ambulatory Visit (INDEPENDENT_AMBULATORY_CARE_PROVIDER_SITE_OTHER): Payer: Medicare Other | Admitting: Internal Medicine

## 2017-09-18 ENCOUNTER — Encounter: Payer: Self-pay | Admitting: Internal Medicine

## 2017-09-18 VITALS — BP 100/60 | HR 79 | Ht 63.5 in

## 2017-09-18 DIAGNOSIS — E039 Hypothyroidism, unspecified: Secondary | ICD-10-CM | POA: Diagnosis not present

## 2017-09-18 DIAGNOSIS — G250 Essential tremor: Secondary | ICD-10-CM | POA: Diagnosis not present

## 2017-09-18 DIAGNOSIS — I10 Essential (primary) hypertension: Secondary | ICD-10-CM

## 2017-09-18 DIAGNOSIS — Z8679 Personal history of other diseases of the circulatory system: Secondary | ICD-10-CM

## 2017-09-18 DIAGNOSIS — H8110 Benign paroxysmal vertigo, unspecified ear: Secondary | ICD-10-CM

## 2017-09-18 DIAGNOSIS — I5032 Chronic diastolic (congestive) heart failure: Secondary | ICD-10-CM

## 2017-09-18 DIAGNOSIS — F419 Anxiety disorder, unspecified: Secondary | ICD-10-CM | POA: Diagnosis not present

## 2017-09-18 DIAGNOSIS — N183 Chronic kidney disease, stage 3 unspecified: Secondary | ICD-10-CM

## 2017-09-18 DIAGNOSIS — K219 Gastro-esophageal reflux disease without esophagitis: Secondary | ICD-10-CM

## 2017-09-18 DIAGNOSIS — Z950 Presence of cardiac pacemaker: Secondary | ICD-10-CM | POA: Diagnosis not present

## 2017-09-18 DIAGNOSIS — H3530 Unspecified macular degeneration: Secondary | ICD-10-CM | POA: Diagnosis not present

## 2017-09-18 DIAGNOSIS — Z7901 Long term (current) use of anticoagulants: Secondary | ICD-10-CM | POA: Diagnosis not present

## 2017-09-18 DIAGNOSIS — I480 Paroxysmal atrial fibrillation: Secondary | ICD-10-CM

## 2017-09-18 DIAGNOSIS — H903 Sensorineural hearing loss, bilateral: Secondary | ICD-10-CM

## 2017-09-18 LAB — COMPLETE METABOLIC PANEL WITH GFR
AG Ratio: 1.6 (calc) (ref 1.0–2.5)
ALBUMIN MSPROF: 4.1 g/dL (ref 3.6–5.1)
ALKALINE PHOSPHATASE (APISO): 78 U/L (ref 33–130)
ALT: 5 U/L — AB (ref 6–29)
AST: 9 U/L — ABNORMAL LOW (ref 10–35)
BILIRUBIN TOTAL: 0.8 mg/dL (ref 0.2–1.2)
BUN / CREAT RATIO: 17 (calc) (ref 6–22)
BUN: 25 mg/dL (ref 7–25)
CHLORIDE: 104 mmol/L (ref 98–110)
CO2: 25 mmol/L (ref 20–32)
CREATININE: 1.43 mg/dL — AB (ref 0.60–0.88)
Calcium: 9.6 mg/dL (ref 8.6–10.4)
GFR, Est African American: 36 mL/min/{1.73_m2} — ABNORMAL LOW (ref >=60)
GFR, Est Non African American: 31 mL/min/{1.73_m2} — ABNORMAL LOW (ref >=60)
GLUCOSE: 104 mg/dL — AB (ref 65–99)
Globulin: 2.5 g/dL (calc) (ref 1.9–3.7)
Potassium: 4.7 mmol/L (ref 3.5–5.3)
Sodium: 138 mmol/L (ref 135–146)
Total Protein: 6.6 g/dL (ref 6.1–8.1)

## 2017-09-18 LAB — LIPID PANEL
Cholesterol: 163 mg/dL (ref <200)
HDL: 52 mg/dL (ref >50)
LDL CHOLESTEROL (CALC): 91 mg/dL
NON-HDL CHOLESTEROL (CALC): 111 mg/dL (ref <130)
TRIGLYCERIDES: 105 mg/dL (ref <150)
Total CHOL/HDL Ratio: 3.1 (calc) (ref <5.0)

## 2017-09-18 LAB — CBC WITH DIFFERENTIAL/PLATELET
Basophils Absolute: 112 cells/uL (ref 0–200)
Basophils Relative: 1.7 %
EOS PCT: 6.8 %
Eosinophils Absolute: 449 cells/uL (ref 15–500)
HEMATOCRIT: 38.1 % (ref 35.0–45.0)
Hemoglobin: 12.8 g/dL (ref 11.7–15.5)
LYMPHS ABS: 1597 {cells}/uL (ref 850–3900)
MCH: 31.8 pg (ref 27.0–33.0)
MCHC: 33.6 g/dL (ref 32.0–36.0)
MCV: 94.5 fL (ref 80.0–100.0)
MPV: 10.6 fL (ref 7.5–12.5)
Monocytes Relative: 11 %
Neutro Abs: 3716 cells/uL (ref 1500–7800)
Neutrophils Relative %: 56.3 %
Platelets: 235 10*3/uL (ref 140–400)
RBC: 4.03 10*6/uL (ref 3.80–5.10)
RDW: 12.7 % (ref 11.0–15.0)
Total Lymphocyte: 24.2 %
WBC: 6.6 10*3/uL (ref 3.8–10.8)
WBCMIX: 726 {cells}/uL (ref 200–950)

## 2017-09-18 LAB — TSH: TSH: 2.76 m[IU]/L (ref 0.40–4.50)

## 2017-09-18 NOTE — Progress Notes (Signed)
   Subjective:    Patient ID: [name], female    DOB: 02/07/1922, 84 y.o.   MRN: [id number]  HPI 84 year old Female currently living independently in her own apartment in [place].  She is accompanied by her daughter today.  Daughter lives in [place].  She does not visit her mother every day.  Mother does fairly well but recently [place] have forgotten some of her medications.  Daughter fills pill box weekly  but she  noticed recently some medications had not been taken.  This is concerning given her multiple medical problems.  I think at some point she is going to need more supervision in independent living and I explained this to her daughter today.  She is asking if something can be done in terms of home health services but I do not think patient would qualify.  Patient is a little feeble.  She ambulates with walker or cane.  She has become a bit forgetful.  She has remarkable stamina however.  She is on Eliquis for paroxysmal atrial fibrillation as well as metoprolol.  History of hypothyroidism.  History of hearing loss.  She does have a pacemaker.  She has chronic kidney disease.  She has macular degeneration.  History of benign paroxysmal vertigo treated with Xanax.  She has anxiety treated with Xanax sparingly.    Review of Systems see above- skin warm and dry.      Objective:   Physical Exam Skin warm and dry.  Nodes none.  Neck is supple without JVD, thyromegaly, or carotid bruits Cardiac exam reveals irregular irregular rhythm.  Rate is stable.  Chest clear to auscultation.       Assessment & Plan:  Paroxysmal atrial fibrillation on chronic anticoagulation therapy  Hearing loss-wears hearing aids  Memory loss-forgetting to take some of her medications apparently  Hypothyroidism-TSH checked today is within normal limits  History of congestive heart failure-no evidence of heart failure today  Macular degeneration  GE reflux-treated with PPI  Benign essential  tremor-unchanged  Anxiety treated sparingly with Xanax  History of vertigo treated sparingly with Xanax  Cardiac pacemaker in situ-followed by cardiology  Chronic kidney disease likely secondary to long-standing history of hypertension with creatinine stable at 1.43.  Herlipid panel is normal.  TSH is normal on current dose of thyroid replacement.  I think it is likely she will need another level of care in the near future.  Discussed with patient's daughter today.  Plan: She will follow-up with physical exam and Medicare wellness exam in 6 months.  Continue same medications.  Asked daughter to check on her mother a little more often with regard to medication compliance.

## 2017-09-24 DIAGNOSIS — H353122 Nonexudative age-related macular degeneration, left eye, intermediate dry stage: Secondary | ICD-10-CM | POA: Diagnosis not present

## 2017-09-24 DIAGNOSIS — H353113 Nonexudative age-related macular degeneration, right eye, advanced atrophic without subfoveal involvement: Secondary | ICD-10-CM | POA: Diagnosis not present

## 2017-09-24 DIAGNOSIS — Z961 Presence of intraocular lens: Secondary | ICD-10-CM | POA: Diagnosis not present

## 2017-09-24 DIAGNOSIS — H43813 Vitreous degeneration, bilateral: Secondary | ICD-10-CM | POA: Diagnosis not present

## 2017-09-27 ENCOUNTER — Other Ambulatory Visit: Payer: Self-pay | Admitting: Cardiology

## 2017-09-30 ENCOUNTER — Encounter: Payer: Self-pay | Admitting: Internal Medicine

## 2017-10-07 DIAGNOSIS — R0602 Shortness of breath: Secondary | ICD-10-CM | POA: Diagnosis not present

## 2017-10-07 DIAGNOSIS — R918 Other nonspecific abnormal finding of lung field: Secondary | ICD-10-CM | POA: Diagnosis not present

## 2017-10-07 DIAGNOSIS — J441 Chronic obstructive pulmonary disease with (acute) exacerbation: Secondary | ICD-10-CM | POA: Diagnosis not present

## 2017-10-07 DIAGNOSIS — J90 Pleural effusion, not elsewhere classified: Secondary | ICD-10-CM | POA: Diagnosis not present

## 2017-10-07 DIAGNOSIS — J9811 Atelectasis: Secondary | ICD-10-CM | POA: Diagnosis not present

## 2017-10-07 DIAGNOSIS — R7989 Other specified abnormal findings of blood chemistry: Secondary | ICD-10-CM | POA: Diagnosis not present

## 2017-10-11 DIAGNOSIS — H353113 Nonexudative age-related macular degeneration, right eye, advanced atrophic without subfoveal involvement: Secondary | ICD-10-CM | POA: Diagnosis not present

## 2017-10-11 DIAGNOSIS — H353122 Nonexudative age-related macular degeneration, left eye, intermediate dry stage: Secondary | ICD-10-CM | POA: Diagnosis not present

## 2017-11-07 ENCOUNTER — Encounter: Payer: Medicare Other | Admitting: *Deleted

## 2017-11-08 ENCOUNTER — Telehealth: Payer: Self-pay

## 2017-11-08 NOTE — Telephone Encounter (Signed)
LMOVM reminding pt to send remote transmission.   

## 2017-11-13 ENCOUNTER — Encounter: Payer: Self-pay | Admitting: Cardiology

## 2017-11-13 ENCOUNTER — Ambulatory Visit (INDEPENDENT_AMBULATORY_CARE_PROVIDER_SITE_OTHER): Payer: Medicare Other | Admitting: *Deleted

## 2017-11-13 DIAGNOSIS — I495 Sick sinus syndrome: Secondary | ICD-10-CM | POA: Diagnosis not present

## 2017-11-13 DIAGNOSIS — I10 Essential (primary) hypertension: Secondary | ICD-10-CM

## 2017-11-13 NOTE — Progress Notes (Signed)
Remote pacemaker transmission.   

## 2017-11-21 ENCOUNTER — Other Ambulatory Visit: Payer: Self-pay | Admitting: Cardiology

## 2017-11-21 DIAGNOSIS — H353221 Exudative age-related macular degeneration, left eye, with active choroidal neovascularization: Secondary | ICD-10-CM | POA: Diagnosis not present

## 2017-11-21 DIAGNOSIS — H35363 Drusen (degenerative) of macula, bilateral: Secondary | ICD-10-CM | POA: Diagnosis not present

## 2017-11-21 DIAGNOSIS — Z961 Presence of intraocular lens: Secondary | ICD-10-CM | POA: Diagnosis not present

## 2017-11-21 DIAGNOSIS — H353211 Exudative age-related macular degeneration, right eye, with active choroidal neovascularization: Secondary | ICD-10-CM | POA: Diagnosis not present

## 2017-11-21 DIAGNOSIS — H35721 Serous detachment of retinal pigment epithelium, right eye: Secondary | ICD-10-CM | POA: Diagnosis not present

## 2017-11-21 NOTE — Telephone Encounter (Signed)
Rx sent to pharmacy   

## 2017-11-25 DIAGNOSIS — R05 Cough: Secondary | ICD-10-CM | POA: Diagnosis not present

## 2017-11-25 DIAGNOSIS — R0600 Dyspnea, unspecified: Secondary | ICD-10-CM | POA: Diagnosis not present

## 2017-11-25 DIAGNOSIS — R9389 Abnormal findings on diagnostic imaging of other specified body structures: Secondary | ICD-10-CM | POA: Diagnosis not present

## 2017-11-25 DIAGNOSIS — J929 Pleural plaque without asbestos: Secondary | ICD-10-CM | POA: Diagnosis not present

## 2017-11-25 DIAGNOSIS — J449 Chronic obstructive pulmonary disease, unspecified: Secondary | ICD-10-CM | POA: Diagnosis not present

## 2017-11-25 DIAGNOSIS — I4891 Unspecified atrial fibrillation: Secondary | ICD-10-CM | POA: Diagnosis not present

## 2017-11-25 DIAGNOSIS — J9811 Atelectasis: Secondary | ICD-10-CM | POA: Diagnosis not present

## 2017-11-25 DIAGNOSIS — R0602 Shortness of breath: Secondary | ICD-10-CM | POA: Diagnosis not present

## 2017-11-25 DIAGNOSIS — J90 Pleural effusion, not elsewhere classified: Secondary | ICD-10-CM | POA: Diagnosis not present

## 2017-12-08 LAB — CUP PACEART REMOTE DEVICE CHECK
Battery Voltage: 2.79 V
Implantable Lead Implant Date: 20051017
Implantable Lead Location: 753859
Implantable Lead Location: 753860
Implantable Pulse Generator Implant Date: 20150914
Lead Channel Impedance Value: 498 Ohm
Lead Channel Pacing Threshold Amplitude: 1 V
Lead Channel Pacing Threshold Pulse Width: 0.4 ms
MDC IDC LEAD IMPLANT DT: 20051017
MDC IDC MSMT BATTERY IMPEDANCE: 377 Ohm
MDC IDC MSMT BATTERY REMAINING LONGEVITY: 104 mo
MDC IDC MSMT LEADCHNL RA IMPEDANCE VALUE: 67 Ohm
MDC IDC SESS DTM: 20190813162059
MDC IDC SET LEADCHNL RV PACING AMPLITUDE: 2.5 V
MDC IDC SET LEADCHNL RV PACING PULSEWIDTH: 0.4 ms
MDC IDC SET LEADCHNL RV SENSING SENSITIVITY: 5.6 mV
MDC IDC STAT BRADY RV PERCENT PACED: 19 %

## 2017-12-15 ENCOUNTER — Other Ambulatory Visit: Payer: Self-pay | Admitting: Cardiology

## 2018-01-23 ENCOUNTER — Other Ambulatory Visit: Payer: Self-pay | Admitting: Internal Medicine

## 2018-02-12 ENCOUNTER — Telehealth: Payer: Self-pay | Admitting: Cardiology

## 2018-02-12 ENCOUNTER — Encounter: Payer: Medicare Other | Admitting: *Deleted

## 2018-02-12 NOTE — Telephone Encounter (Signed)
LMOVM reminding pt to send remote transmission.   

## 2018-02-13 ENCOUNTER — Encounter: Payer: Self-pay | Admitting: Cardiology

## 2018-02-16 ENCOUNTER — Other Ambulatory Visit: Payer: Self-pay | Admitting: Cardiology

## 2018-02-18 ENCOUNTER — Telehealth: Payer: Self-pay | Admitting: Internal Medicine

## 2018-02-18 ENCOUNTER — Other Ambulatory Visit: Payer: Self-pay

## 2018-02-18 ENCOUNTER — Other Ambulatory Visit: Payer: Self-pay | Admitting: Internal Medicine

## 2018-02-18 NOTE — Telephone Encounter (Signed)
Encounter not needed

## 2018-02-19 ENCOUNTER — Ambulatory Visit (INDEPENDENT_AMBULATORY_CARE_PROVIDER_SITE_OTHER): Payer: Medicare Other

## 2018-02-19 DIAGNOSIS — I495 Sick sinus syndrome: Secondary | ICD-10-CM | POA: Diagnosis not present

## 2018-02-19 DIAGNOSIS — I10 Essential (primary) hypertension: Secondary | ICD-10-CM

## 2018-02-20 NOTE — Progress Notes (Signed)
Remote pacemaker transmission.   

## 2018-02-21 DIAGNOSIS — H6123 Impacted cerumen, bilateral: Secondary | ICD-10-CM | POA: Diagnosis not present

## 2018-02-21 DIAGNOSIS — I5032 Chronic diastolic (congestive) heart failure: Secondary | ICD-10-CM

## 2018-02-21 DIAGNOSIS — R0602 Shortness of breath: Secondary | ICD-10-CM

## 2018-03-19 ENCOUNTER — Other Ambulatory Visit: Payer: Self-pay | Admitting: Cardiology

## 2018-03-20 ENCOUNTER — Other Ambulatory Visit: Payer: Self-pay | Admitting: Cardiology

## 2018-03-26 IMAGING — DX DG CHEST 2V
2 series · 2 of 2 positions shown · non-contrast
Comparison: 12/22/2016

CLINICAL DATA: History of rib fractures and shortness of Breath

EXAM:
CHEST  2 VIEW

[chest lat]
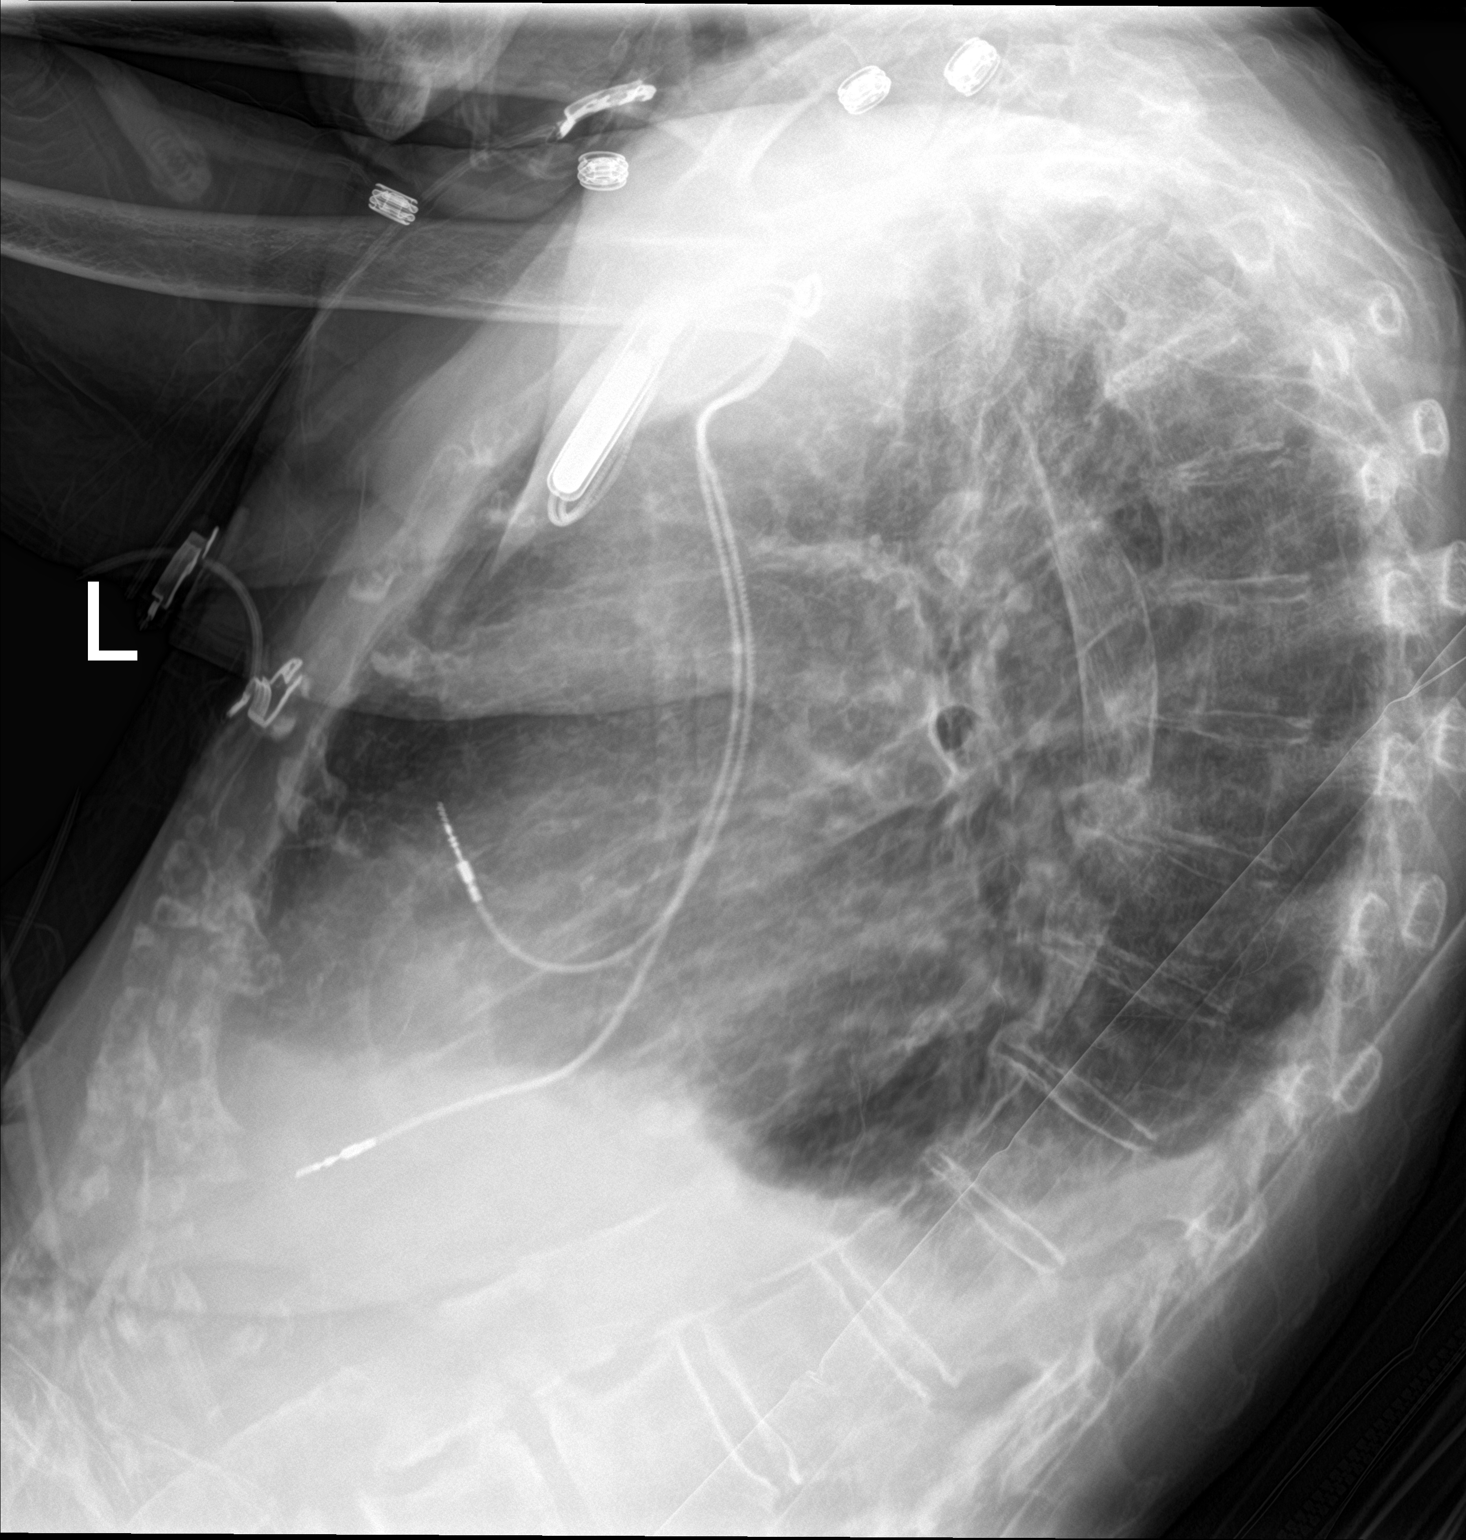

[chest ap]
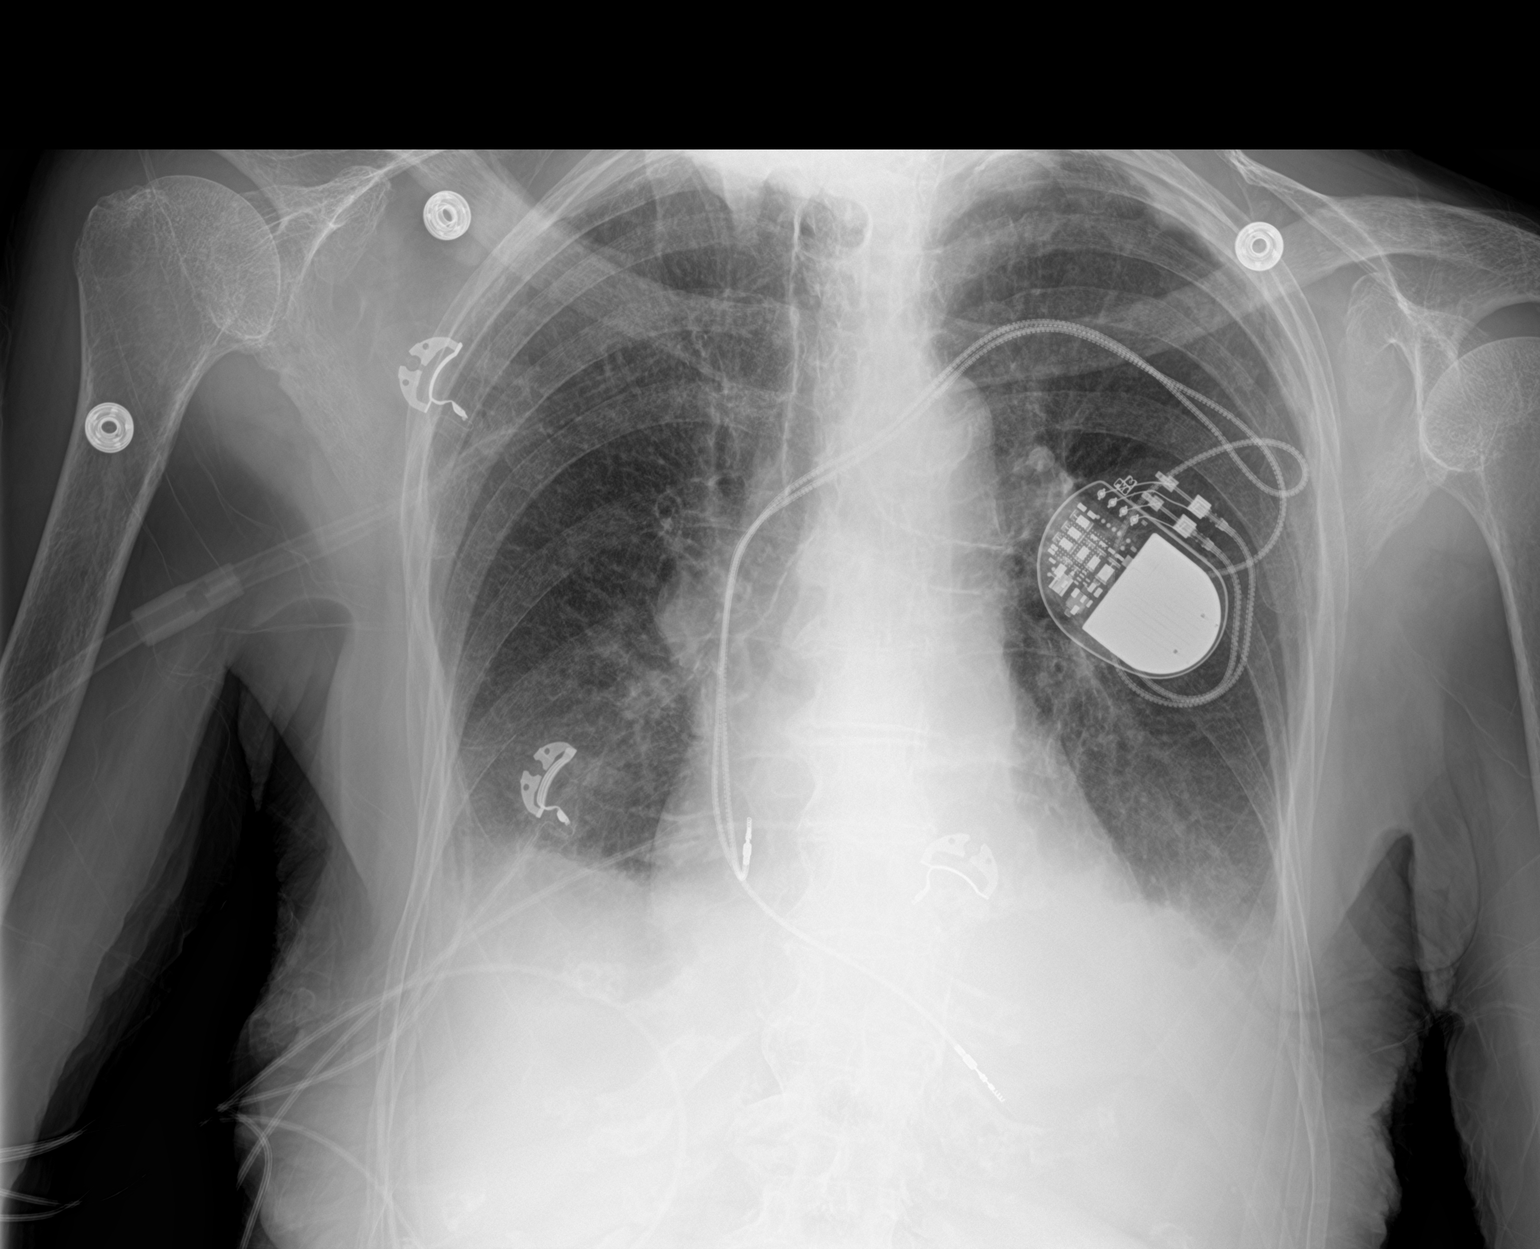

[2 of 2 positions shown; findings below may reference images not displayed]

FINDINGS: Cardiac shadow is mildly enlarged. Pacing device is seen. Small
bilateral pleural effusions are noted stable from the prior exam. No
new focal abnormality is seen. The known rib fractures are not well
appreciated on this exam.
IMPRESSION: Stable small effusions bilaterally.

## 2018-04-08 ENCOUNTER — Ambulatory Visit (INDEPENDENT_AMBULATORY_CARE_PROVIDER_SITE_OTHER): Payer: Medicare Other | Admitting: Internal Medicine

## 2018-04-08 VITALS — BP 110/80 | HR 88 | Temp 98.0°F | Wt 123.0 lb

## 2018-04-08 DIAGNOSIS — H3530 Unspecified macular degeneration: Secondary | ICD-10-CM | POA: Diagnosis not present

## 2018-04-08 DIAGNOSIS — I10 Essential (primary) hypertension: Secondary | ICD-10-CM | POA: Diagnosis not present

## 2018-04-08 DIAGNOSIS — E039 Hypothyroidism, unspecified: Secondary | ICD-10-CM

## 2018-04-08 DIAGNOSIS — I5032 Chronic diastolic (congestive) heart failure: Secondary | ICD-10-CM | POA: Diagnosis not present

## 2018-04-08 DIAGNOSIS — Z0000 Encounter for general adult medical examination without abnormal findings: Secondary | ICD-10-CM | POA: Diagnosis not present

## 2018-04-08 DIAGNOSIS — R0902 Hypoxemia: Secondary | ICD-10-CM

## 2018-04-08 DIAGNOSIS — G250 Essential tremor: Secondary | ICD-10-CM | POA: Diagnosis not present

## 2018-04-08 DIAGNOSIS — N183 Chronic kidney disease, stage 3 unspecified: Secondary | ICD-10-CM

## 2018-04-08 DIAGNOSIS — F419 Anxiety disorder, unspecified: Secondary | ICD-10-CM | POA: Diagnosis not present

## 2018-04-08 DIAGNOSIS — I480 Paroxysmal atrial fibrillation: Secondary | ICD-10-CM | POA: Diagnosis not present

## 2018-04-08 LAB — POCT URINALYSIS DIPSTICK
Appearance: NEGATIVE
Bilirubin, UA: NEGATIVE
Blood, UA: NEGATIVE
Glucose, UA: NEGATIVE
Ketones, UA: NEGATIVE
LEUKOCYTES UA: NEGATIVE
Nitrite, UA: NEGATIVE
Odor: NEGATIVE
Protein, UA: POSITIVE — AB
Spec Grav, UA: 1.02 (ref 1.010–1.025)
Urobilinogen, UA: 0.2 E.U./dL
pH, UA: 6 (ref 5.0–8.0)

## 2018-04-08 NOTE — Progress Notes (Signed)
Subjective:    Patient ID: THRESEA DOBLE, female    DOB: [birth year], 85 y.o.   MRN: [id number]  HPI 85 year old female in today for health maintenance exam, Medicare wellness and evaluation of medical issues.  She has a history of hypothyroidism, chronic kidney disease stage III, atrial fibrillation on chronic anticoagulation, asthma/COPD requiring oxygen therapy.  She resides in an apartment in [place] fairly close to her daughter but resides alone.  Daughter brings her food.  Have spoken with daughter about having some help in patient's apartment to assist with meal preparation, bathing etc.  She had an esophageal stricture dilated in 1996.  Longstanding history of GE reflux.  She has a pacemaker.  She ambulates with a cane.  Had acute diastolic heart failure in 2015 precipitated by atrial fibrillation with rapid ventricular response.  History of hypertension.  Pacemaker was placed for sick sinus syndrome in 2005.  Other medical issues include stress urinary incontinence, osteoarthritis, chronic kidney disease, hearing loss of both ears, history of anemia.  History of anxiety, history of Schatzki's ring dilated in 1992, hypothyroidism, essential tremor that started in the early 1990s.  Last colonoscopy was in 2001 and not repeated due to age  Cataract extraction right eye 1992 and left eye 1993  History of right middle lobe pneumonia 68  Social history: She is a widow.  She is a retired Chief Technology Officer.  She has maintained good physical conditioning.  Formally smoked a pack of cigarettes daily for some 5 years but quit in the 1960s.  Son resides in [place].  For time weight loss was an issue we will watch her weight but it has been stable at the present time.  Appetite is fair.  She snacks some but eats about 1 good meal a day.  Family history: Father died at age 1 of acute leukemia.  Mother died at age 10 of kidney failure.  One brother died at age 27 of cirrhosis of the  liver.  Had one sister.      Review of Systems  Constitutional: Positive for fatigue.  Respiratory:       Shortness of breath with exertion requiring oxygen  Cardiovascular: Negative for chest pain.       History of atrial fibrillation and sick sinus syndrome requiring pacemaker  Gastrointestinal:       History of GE reflux maintained for many years on PPI  Endocrine:       History of hypothyroidism  Genitourinary: Negative.   Musculoskeletal: Positive for arthralgias.  Neurological:       Bilateral hearing loss and wears bilateral hearing aids  Psychiatric/Behavioral: Negative.        Objective:   Physical Exam Constitutional:      Appearance: Normal appearance. She is not diaphoretic.  HENT:     Head: Normocephalic and atraumatic.     Comments: Hearing loss both ears    Right Ear: Tympanic membrane normal.     Left Ear: Tympanic membrane normal.     Mouth/Throat:     Mouth: Mucous membranes are moist.     Pharynx: Oropharynx is clear.  Eyes:     General: No scleral icterus.       Right eye: No discharge.        Left eye: No discharge.     Conjunctiva/sclera: Conjunctivae normal.     Pupils: Pupils are equal, round, and reactive to light.  Neck:     Musculoskeletal: Neck supple. No neck rigidity.  Vascular: No carotid bruit.  Cardiovascular:     Heart sounds: No murmur.     Comments: Irregular irregular rhythm Pulmonary:     Effort: Pulmonary effort is normal.     Breath sounds: Normal breath sounds. No wheezing or rales.  Abdominal:     Palpations: Abdomen is soft. There is no mass.     Tenderness: There is no guarding.  Genitourinary:    Comments: Deferred due to age Skin:    General: Skin is warm and dry.  Neurological:     General: No focal deficit present.     Mental Status: She is alert and oriented to person, place, and time.  Psychiatric:        Mood and Affect: Mood normal.        Behavior: Behavior normal.        Thought Content: Thought  content normal.        Judgment: Judgment normal.           Assessment & Plan:  Paroxysmal atrial fibrillation on chronic anticoagulation  History of congestive heart failure-acute diastolic heart failure  History of sick sinus syndrome requiring pacemaker placement  Essential tremor  Bilateral hearing loss treated with bilateral hearing aids  Hypothyroidism  Chronic kidney disease  History of benign positional vertigo  Macular degeneration  Anxiety  Essential hypertension  Chronic hypoxemia likely related to age and heart issues.  O2 sats dropped with exertion from 94% on room air to 88% while walking down the hall.  Have completed form for oxygen therapy.  Plan: Patient will continue same medications and return in 6 months.  Form completed for oxygen therapy.  Have requested patient have some help in her apartment which [place] take some stress off of her daughter.  Daughter will look into agencies that can help out her mother  Subjective:   Patient presents for Medicare Annual/Subsequent preventive examination.  Review Past Medical/Family/Social:   Risk Factors  Current exercise habits:  Dietary issues discussed:   Cardiac risk factors:  Depression Screen  (Note: if answer to either of the following is "Yes", a more complete depression screening is indicated)   Over the past two weeks, have you felt down, depressed or hopeless? No  Over the past two weeks, have you felt little interest or pleasure in doing things? No Have you lost interest or pleasure in daily life? No Do you often feel hopeless? No Do you cry easily over simple problems? No   Activities of Daily Living  In your present state of health, do you have any difficulty performing the following activities?:   Driving? No longer drives Managing money?  Has help from daughter Feeding yourself? No  Getting from bed to chair? No  Climbing a flight of stairs?  Yes Preparing food and eating?:  Has  help from daughter who delivers food there to her apartment Bathing or showering? No  Getting dressed: No  Getting to the toilet? No  Using the toilet:No  Moving around from place to place: Sometimes In the past year have you fallen or had a near fall?:No  Are you sexually active? No  Do you have more than one partner? No   Hearing Difficulties: Yes wear hearing aids Do you often ask people to speak up or repeat themselves?  Yes wear hearing aids Do you experience ringing or noises in your ears?  Yes Do you have difficulty understanding soft or whispered voices?  Yes wear hearing aids Do you feel  that you have a problem with memory?  Sometimes Do you often misplace items?  Sometimes   Home Safety:  Do you have a smoke alarm at your residence? Yes Do you have grab bars in the bathroom?  Yes Do you have throw rugs in your house?  No   Cognitive Testing  Alert? Yes Normal Appearance?Yes  Oriented to person? Yes Place? Yes  Time? Yes  Recall of three objects?  Not tested Can perform simple calculations?  Not tested Displays appropriate judgment?Yes  Can read the correct time from a watch face?Yes   List the Names of Other Physician/Practitioners you currently use:  See referral list for the physicians patient is currently seeing.  Cardiologist   Review of Systems: See above   Objective:     General appearance: Appears stated age and thin Head: Normocephalic, without obvious abnormality, atraumatic  Eyes: conj clear, EOMi PEERLA  Ears: normal TM's and external ear canals both ears  Nose: Nares normal. Septum midline. Mucosa normal. No drainage or sinus tenderness.  Throat: lips, mucosa, and tongue normal; teeth and gums normal  Neck: no adenopathy, no carotid bruit, no JVD, supple, symmetrical, trachea midline and thyroid not enlarged, symmetric, no tenderness/mass/nodules  No CVA tenderness.  Lungs: clear to auscultation bilaterally  Breasts: Not examined Heart:  regular rate and rhythm, S1, S2 normal, no murmur, click, rub or gallop  Abdomen: soft, non-tender; bowel sounds normal; no masses, no organomegaly  Musculoskeletal: ROM normal in all joints, no crepitus, no deformity, Normal muscle strengthen. Back  is symmetric, no curvature. Skin: Skin color, texture, turgor normal. No rashes or lesions  Lymph nodes: Cervical, supraclavicular, and axillary nodes normal.  Neurologic: CN 2 -12 Normal, Normal symmetric reflexes. Normal coordination and gait  Psych: Alert & Oriented x 3, Mood appear stable.    Assessment:    Annual wellness medicare exam   Plan:    During the course of the visit the patient was educated and counseled about appropriate screening and preventive services including:   Annual flu vaccine     Patient Instructions (the written plan) was given to the patient.  Medicare Attestation  I have personally reviewed:  The patient's medical and social history  Their use of alcohol, tobacco or illicit drugs  Their current medications and supplements  The patient's functional ability including ADLs,fall risks, home safety risks, cognitive, and hearing and visual impairment  Diet and physical activities  Evidence for depression or mood disorders  The patient's weight, height, BMI, and visual acuity have been recorded in the chart. I have made referrals, counseling, and provided education to the patient based on review of the above and I have provided the patient with a written personalized care plan for preventive services.

## 2018-04-08 NOTE — Patient Instructions (Addendum)
Spoke with daughter about getting home health aide for the house since she is missing some of her medications.  Names of home health agencies given.  Need for oxygen when ambulating and has O2 sats dropped from 94 to 88% on room air.  Return in 6 months.  Continue same medications.

## 2018-04-09 ENCOUNTER — Telehealth: Payer: Self-pay | Admitting: Internal Medicine

## 2018-04-09 LAB — LIPID PANEL
CHOL/HDL RATIO: 3.2 (calc) (ref <5.0)
Cholesterol: 167 mg/dL (ref <200)
HDL: 52 mg/dL (ref >50)
LDL Cholesterol (Calc): 90 mg/dL (calc)
NON-HDL CHOLESTEROL (CALC): 115 mg/dL (ref <130)
Triglycerides: 148 mg/dL (ref <150)

## 2018-04-09 LAB — COMPLETE METABOLIC PANEL WITH GFR
AG Ratio: 1.9 (calc) (ref 1.0–2.5)
ALT: 7 U/L (ref 6–29)
AST: 12 U/L (ref 10–35)
Albumin: 3.9 g/dL (ref 3.6–5.1)
Alkaline phosphatase (APISO): 78 U/L (ref 33–130)
BUN/Creatinine Ratio: 15 (calc) (ref 6–22)
BUN: 23 mg/dL (ref 7–25)
CO2: 25 mmol/L (ref 20–32)
Calcium: 9.4 mg/dL (ref 8.6–10.4)
Chloride: 104 mmol/L (ref 98–110)
Creat: 1.53 mg/dL — ABNORMAL HIGH (ref 0.60–0.88)
GFR, EST NON AFRICAN AMERICAN: 29 mL/min/{1.73_m2} — AB (ref >=60)
GFR, Est African American: 33 mL/min/{1.73_m2} — ABNORMAL LOW (ref >=60)
GLUCOSE: 87 mg/dL (ref 65–99)
Globulin: 2.1 g/dL (calc) (ref 1.9–3.7)
Potassium: 4.5 mmol/L (ref 3.5–5.3)
Sodium: 138 mmol/L (ref 135–146)
TOTAL PROTEIN: 6 g/dL — AB (ref 6.1–8.1)
Total Bilirubin: 0.8 mg/dL (ref 0.2–1.2)

## 2018-04-09 LAB — CBC WITH DIFFERENTIAL/PLATELET
Absolute Monocytes: 688 cells/uL (ref 200–950)
BASOS PCT: 0.9 %
Basophils Absolute: 72 cells/uL (ref 0–200)
Eosinophils Absolute: 360 cells/uL (ref 15–500)
Eosinophils Relative: 4.5 %
HCT: 38.3 % (ref 35.0–45.0)
Hemoglobin: 12.7 g/dL (ref 11.7–15.5)
Lymphs Abs: 1192 cells/uL (ref 850–3900)
MCH: 31.9 pg (ref 27.0–33.0)
MCHC: 33.2 g/dL (ref 32.0–36.0)
MCV: 96.2 fL (ref 80.0–100.0)
MPV: 10.8 fL (ref 7.5–12.5)
Monocytes Relative: 8.6 %
Neutro Abs: 5688 cells/uL (ref 1500–7800)
Neutrophils Relative %: 71.1 %
Platelets: 227 10*3/uL (ref 140–400)
RBC: 3.98 10*6/uL (ref 3.80–5.10)
RDW: 12.7 % (ref 11.0–15.0)
Total Lymphocyte: 14.9 %
WBC: 8 10*3/uL (ref 3.8–10.8)

## 2018-04-09 LAB — TSH: TSH: 3.33 mIU/L (ref 0.40–4.50)

## 2018-04-09 NOTE — Telephone Encounter (Signed)
Cassandra from [facility] called stating that they received a call from the patient's family requesting a hospice evaluation.  They would like to know if you agree that the patient has a prognosis of 6 months or less and would follow the patient through Hospice care.  Vito Backers can be reached at [phone]

## 2018-04-09 NOTE — Telephone Encounter (Signed)
Spoke with [name] that pt has no terminal illness at this time that I am aware of, and I cannot certify that she has less than 6 months to live. Must be a misunderstanding. Did not agree to this yesterday when  I spoke with daughter who was concerned pt was forgetting her meds. We gave daughter names of [facility] and asked that she consider an aide 4 hours a day. I even wrote order for this at daughter's request. Daughter is fatigued with caring for her mother. She only lives 4 blocks away in an apartment. She prepares meals and takes them to mother. Gets little help from brother who lives out of state.Mother is oxygen dependent due to heart issues.

## 2018-04-12 ENCOUNTER — Telehealth: Payer: Self-pay | Admitting: Cardiology

## 2018-04-12 NOTE — Telephone Encounter (Signed)
Returned call to [place] with [facility].She stated family requested Hospice Care.She was calling to find out if Dr.Hochrein thought patient would qualify for Hospice Care and if he is willing to follow her, if not a Hospice Dr.would follow her.She stated patient has appointment with Dr.Hochrein on Mon 1/13.Advised Dr.Hochrein out of office this afternoon.Advised I will send message to Dr.Hochrein.

## 2018-04-12 NOTE — Telephone Encounter (Signed)
New Message   [name] with [facility], states the pt's family is wanting the pt to have hospice services and they are wanting to know if Dr. Percival Spanish feels this is appropriate for the pt and if he would act as the primary for hospice

## 2018-04-13 NOTE — Telephone Encounter (Signed)
I would prefer the primary provider be the Hospice provider as I am not in the office every day or week.

## 2018-04-13 NOTE — Progress Notes (Signed)
HPI The patient presents for follow up.  She is done relatively well since I saw her although she does get dyspnea with exertion.  She was noted recently to doctor's appointment to have a sat that failed 88% with ambulation.  She wears PRN oxygen occasionally.  She was in the emergency room in [facility] in August and I reviewed these records.  She had some shortness of breath but was not noted to have edema on chest x-ray.  There were no significant laboratory abnormalities.  She gets around slowly with a cane but she can still do a little shopping and light housekeeping.  She moved to a senior citizens apartment.  But she still lives independently    No Known Allergies  Current Outpatient Medications  Medication Sig Dispense Refill  . ADVAIR DISKUS 250-50 MCG/DOSE AEPB Inhale 1 puff into the lungs 2 (two) times daily. 90 each 3  . ALPRAZolam (XANAX) 0.5 MG tablet TAKE 1 TABLET 3 TIMES A DAY 90 tablet 1  . apixaban (ELIQUIS) 2.5 MG TABS tablet Take 1 tablet (2.5 mg total) by mouth 2 (two) times daily. 180 tablet 1  . Cholecalciferol (VITAMIN D3) 1000 UNITS CAPS Take 1,000 Units by mouth daily.     . cyanocobalamin 500 MCG tablet Take 1 tablet (500 mcg total) by mouth daily.    Marland Kitchen diltiazem (CARDIZEM CD) 120 MG 24 hr capsule TAKE 1 CAPSULE BY MOUTH EVERY DAY 30 capsule 9  . ferrous sulfate 325 (65 FE) MG tablet Take 325 mg by mouth daily with breakfast.      . furosemide (LASIX) 20 MG tablet TAKE 1 TABLET (20 MG TOTAL) BY MOUTH DAILY 90 tablet 0  . levothyroxine (SYNTHROID, LEVOTHROID) 88 MCG tablet Take 1 tablet (88 mcg total) by mouth daily. 90 tablet 1  . metoprolol tartrate (LOPRESSOR) 50 MG tablet Take 1 tablet (50 mg total) by mouth 2 (two) times daily. KEEP OV. 180 tablet 0  . Multiple Vitamins-Minerals (PRESERVISION AREDS 2) CAPS Take 1 capsule by mouth 2 (two) times daily.     Vladimir Faster Glycol-Propyl Glycol (SYSTANE OP) Place 1 drop into both eyes daily as needed (dry eyes).     . potassium chloride SA (KLOR-CON M20) 20 MEQ tablet Take 1 tablet (20 mEq total) by mouth daily. KEEP OV. 90 tablet 0   No current facility-administered medications for this visit.     Past Medical History:  Diagnosis Date  . Anemia   . Asthma   . Atrial fibrillation ([place])   . Atrial flutter ([place])    s/p RFCA  . Diverticulosis   . DJD (degenerative joint disease)    knees  . Esophageal stricture   . GERD (gastroesophageal reflux disease)   . Hearing loss   . HTN (hypertension)    a.  echo 3/06: EF 55-65%, mild LVH, mild RVH;   b.  Echo (06/10/13): EF 50-55%, normal wall motion, MAC, mild MR, mild RPE, PASP 40.  Marland Kitchen Hypothyroidism   . Macular degeneration   . Renal insufficiency   . Symptomatic sinus bradycardia    s/p pacemaker  . Tremor, essential   . Urinary, incontinence, stress female   . Vertigo     Past Surgical History:  Procedure Laterality Date  . PACEMAKER INSERTION    . PERMANENT PACEMAKER GENERATOR CHANGE N/A 12/15/2013   Procedure: PERMANENT PACEMAKER GENERATOR CHANGE;  Surgeon: Evans Lance, MD;  Location: [facility] C[facility] LAB;  Service: Cardiovascular;  Laterality: N/A;  .  radiofrequency catheter ablation    . TRANSTHORACIC ECHOCARDIOGRAM  06/10/2013   EF 50-55% Mild MR    ROS:  As stated in the HPI and negative for all other systems.  PHYSICAL EXAM BP 110/60   Pulse 75   Ht 5' 3.5" (1.613 m)   Wt 128 lb (58.1 kg)   BMI 22.32 kg/m  GENERAL:  Well appearing NECK:  No jugular venous distention, waveform within normal limits, carotid upstroke brisk and symmetric, no bruits, no thyromegaly LUNGS:  Clear to auscultation bilaterally CHEST:  Well healed pacer pocket.  HEART:  PMI not displaced or sustained,S1 and S2 within normal limits, no S3, no clicks, no rubs, no murmurs, irregular ABD:  Flat, positive bowel sounds normal in frequency in pitch, no bruits, no rebound, no guarding, no midline pulsatile mass, no hepatomegaly, no splenomegaly EXT:  2 plus  pulses throughout, no edema, no cyanosis no clubbing   EKG:  Atrial fibrillation rate 75, no acute ST-T wave changes.  Poor anterior R wave progression demand pacing.  No significant change from previous. 04/15/2018  ASSESSMENT AND PLAN  ATRIAL FIBRILLATION -  [name] has a CHA2DS2 - VASc score of 4 with a risk of stroke of 4%.   She tolerates anticoagulation.  No change in therapy.  She is on the appropriate dose of anticoagulation for her age and renal function.  She does have some mild insufficiency which is been stable.  CARDIAC PACEMAKER IN SITU -  She is up-to-date with follow-up.  HYPOXEMIA - I did review the report from the emergency room in [place].  I noted that she had some hypoxemia and we talked about PRN use of her oxygen.  At this point I do not think she needs diuresis.  No further cardiac testing is planned.  HYPERTENSION -  Blood pressure is at target.  No change in therapy.   PALLIATIVE CARE - The patient has advanced age and multiple comorbid illnesses.  She is very appropriate for palliative care.  They are interested in quality of life and comfort measures.

## 2018-04-15 ENCOUNTER — Ambulatory Visit (INDEPENDENT_AMBULATORY_CARE_PROVIDER_SITE_OTHER): Payer: Medicare Other | Admitting: Cardiology

## 2018-04-15 ENCOUNTER — Telehealth: Payer: Self-pay | Admitting: Cardiology

## 2018-04-15 ENCOUNTER — Encounter: Payer: Self-pay | Admitting: Cardiology

## 2018-04-15 VITALS — BP 110/60 | HR 75 | Ht 63.5 in | Wt 128.0 lb

## 2018-04-15 DIAGNOSIS — I10 Essential (primary) hypertension: Secondary | ICD-10-CM | POA: Diagnosis not present

## 2018-04-15 DIAGNOSIS — R0902 Hypoxemia: Secondary | ICD-10-CM | POA: Diagnosis not present

## 2018-04-15 DIAGNOSIS — I482 Chronic atrial fibrillation, unspecified: Secondary | ICD-10-CM | POA: Diagnosis not present

## 2018-04-15 NOTE — Patient Instructions (Signed)
Medication Instructions:  Continue current medications  If you need a refill on your cardiac medications before your next appointment, please call your pharmacy.  Labwork: None ordered   Take the provided lab slips with you to the lab for your blood draw.  When you have your labs (blood work) drawn today and your tests are completely normal, you will receive your results only by MyChart Message (if you have MyChart) -OR-  A paper copy in the mail.  If you have any lab test that is abnormal or we need to change your treatment, we will call you to review these results.  Testing/Procedures: None ordered  Follow-Up: You will need a follow up appointment in 12 months.  Please call our office 2 months in advance to schedule this appointment.  You [place] see Dr Percival Spanish or one of the following Advanced Practice Providers on your designated Care Team:   Rosaria Ferries, PA-C . Jory Sims, DNP, ANP    At [facility], you and your health needs are our priority.  As part of our continuing mission to provide you with exceptional heart care, we have created designated Provider Care Teams.  These Care Teams include your primary Cardiologist (physician) and Advanced Practice Providers (APPs -  Physician Assistants and Nurse Practitioners) who all work together to provide you with the care you need, when you need it.  Thank you for choosing [facility]!!

## 2018-04-15 NOTE — Telephone Encounter (Signed)
Pt had appt with Dr Percival Spanish today

## 2018-04-17 LAB — CUP PACEART REMOTE DEVICE CHECK
Battery Remaining Longevity: 102 mo
Battery Voltage: 2.79 V
Brady Statistic RV Percent Paced: 18 %
Date Time Interrogation Session: 20191119185458
Implantable Lead Implant Date: 20051017
Implantable Lead Location: 753859
Implantable Lead Location: 753860
Implantable Lead Model: 5076
Implantable Lead Model: 5076
Implantable Pulse Generator Implant Date: 20150914
Lead Channel Impedance Value: 483 Ohm
Lead Channel Impedance Value: 67 Ohm
Lead Channel Pacing Threshold Amplitude: 1 V
Lead Channel Pacing Threshold Pulse Width: 0.4 ms
Lead Channel Setting Pacing Amplitude: 2.5 V
Lead Channel Setting Pacing Pulse Width: 0.4 ms
Lead Channel Setting Sensing Sensitivity: 5.6 mV
MDC IDC LEAD IMPLANT DT: 20051017
MDC IDC MSMT BATTERY IMPEDANCE: 403 Ohm

## 2018-04-17 NOTE — Addendum Note (Signed)
Addended by: Jacqulynn Cadet on: 04/17/2018 01:07 PM   Modules accepted: Orders

## 2018-04-30 ENCOUNTER — Telehealth: Payer: Self-pay | Admitting: Cardiology

## 2018-04-30 NOTE — Telephone Encounter (Signed)
New message ° ° ° ° °

## 2018-04-30 NOTE — Telephone Encounter (Signed)
OV note, labs demographics,amd medications list faxed to [facility]

## 2018-04-30 NOTE — Telephone Encounter (Signed)
Spoke to [facility] at [facility] - she  states she received a referral - she needs  information sent  To complete the referral-( last office note,demographics,labs, medication list)  Fax  [phone]

## 2018-05-01 DIAGNOSIS — I130 Hypertensive heart and chronic kidney disease with heart failure and stage 1 through stage 4 chronic kidney disease, or unspecified chronic kidney disease: Secondary | ICD-10-CM | POA: Diagnosis not present

## 2018-05-01 DIAGNOSIS — R32 Unspecified urinary incontinence: Secondary | ICD-10-CM | POA: Diagnosis not present

## 2018-05-01 DIAGNOSIS — K5790 Diverticulosis of intestine, part unspecified, without perforation or abscess without bleeding: Secondary | ICD-10-CM | POA: Diagnosis not present

## 2018-05-01 DIAGNOSIS — R001 Bradycardia, unspecified: Secondary | ICD-10-CM | POA: Diagnosis not present

## 2018-05-01 DIAGNOSIS — I480 Paroxysmal atrial fibrillation: Secondary | ICD-10-CM | POA: Diagnosis not present

## 2018-05-01 DIAGNOSIS — N189 Chronic kidney disease, unspecified: Secondary | ICD-10-CM | POA: Diagnosis not present

## 2018-05-01 DIAGNOSIS — M170 Bilateral primary osteoarthritis of knee: Secondary | ICD-10-CM | POA: Diagnosis not present

## 2018-05-01 DIAGNOSIS — N289 Disorder of kidney and ureter, unspecified: Secondary | ICD-10-CM | POA: Diagnosis not present

## 2018-05-01 DIAGNOSIS — D649 Anemia, unspecified: Secondary | ICD-10-CM | POA: Diagnosis not present

## 2018-05-01 DIAGNOSIS — K219 Gastro-esophageal reflux disease without esophagitis: Secondary | ICD-10-CM | POA: Diagnosis not present

## 2018-05-01 DIAGNOSIS — H9193 Unspecified hearing loss, bilateral: Secondary | ICD-10-CM | POA: Diagnosis not present

## 2018-05-01 DIAGNOSIS — E039 Hypothyroidism, unspecified: Secondary | ICD-10-CM | POA: Diagnosis not present

## 2018-05-01 DIAGNOSIS — H3530 Unspecified macular degeneration: Secondary | ICD-10-CM | POA: Diagnosis not present

## 2018-05-01 DIAGNOSIS — J449 Chronic obstructive pulmonary disease, unspecified: Secondary | ICD-10-CM | POA: Diagnosis not present

## 2018-05-01 DIAGNOSIS — J45909 Unspecified asthma, uncomplicated: Secondary | ICD-10-CM | POA: Diagnosis not present

## 2018-05-01 DIAGNOSIS — Z950 Presence of cardiac pacemaker: Secondary | ICD-10-CM | POA: Diagnosis not present

## 2018-05-01 DIAGNOSIS — R42 Dizziness and giddiness: Secondary | ICD-10-CM | POA: Diagnosis not present

## 2018-05-01 DIAGNOSIS — I5031 Acute diastolic (congestive) heart failure: Secondary | ICD-10-CM | POA: Diagnosis not present

## 2018-05-01 DIAGNOSIS — R251 Tremor, unspecified: Secondary | ICD-10-CM | POA: Diagnosis not present

## 2018-05-02 ENCOUNTER — Encounter: Payer: Self-pay | Admitting: Internal Medicine

## 2018-05-02 ENCOUNTER — Ambulatory Visit (INDEPENDENT_AMBULATORY_CARE_PROVIDER_SITE_OTHER): Admitting: Internal Medicine

## 2018-05-02 VITALS — BP 118/72 | HR 77 | Ht 64.0 in | Wt 124.0 lb

## 2018-05-02 DIAGNOSIS — R32 Unspecified urinary incontinence: Secondary | ICD-10-CM | POA: Diagnosis not present

## 2018-05-02 DIAGNOSIS — Z950 Presence of cardiac pacemaker: Secondary | ICD-10-CM

## 2018-05-02 DIAGNOSIS — J449 Chronic obstructive pulmonary disease, unspecified: Secondary | ICD-10-CM | POA: Diagnosis not present

## 2018-05-02 DIAGNOSIS — I495 Sick sinus syndrome: Secondary | ICD-10-CM | POA: Diagnosis not present

## 2018-05-02 DIAGNOSIS — I482 Chronic atrial fibrillation, unspecified: Secondary | ICD-10-CM | POA: Diagnosis not present

## 2018-05-02 DIAGNOSIS — N189 Chronic kidney disease, unspecified: Secondary | ICD-10-CM | POA: Diagnosis not present

## 2018-05-02 DIAGNOSIS — I10 Essential (primary) hypertension: Secondary | ICD-10-CM | POA: Diagnosis not present

## 2018-05-02 DIAGNOSIS — I130 Hypertensive heart and chronic kidney disease with heart failure and stage 1 through stage 4 chronic kidney disease, or unspecified chronic kidney disease: Secondary | ICD-10-CM | POA: Diagnosis not present

## 2018-05-02 DIAGNOSIS — I480 Paroxysmal atrial fibrillation: Secondary | ICD-10-CM | POA: Diagnosis not present

## 2018-05-02 DIAGNOSIS — I5031 Acute diastolic (congestive) heart failure: Secondary | ICD-10-CM | POA: Diagnosis not present

## 2018-05-02 NOTE — Patient Instructions (Signed)
Medication Instructions:  Your physician recommends that you continue on your current medications as directed. Please refer to the Current Medication list given to you today.  Labwork: None ordered.  Testing/Procedures: None ordered.  Follow-Up: Your physician wants you to follow-up in: one year with Dr. Lovena Le.   You will receive a reminder letter in the mail two months in advance. If you don't receive a letter, please call our office to schedule the follow-up appointment.  Remote monitoring is used to monitor your Pacemaker from home. This monitoring reduces the number of office visits required to check your device to one time per year. It allows [place] to keep an eye on the functioning of your device to ensure it is working properly. You are scheduled for a device check from home on 05/21/2018. You [place] send your transmission at any time that day. If you have a wireless device, the transmission will be sent automatically. After your physician reviews your transmission, you will receive a postcard with your next transmission date.  Any Other Special Instructions Will Be Listed Below (If Applicable).  If you need a refill on your cardiac medications before your next appointment, please call your pharmacy.

## 2018-05-02 NOTE — Progress Notes (Signed)
HPI [name] returns today for followup. She is a very pleasant 85 year old woman with a history of symptomatic bradycardia, paroxysmal and now chronic atrial fibrillation, status post permanent pacemaker insertion. In the interim, she has been stable but has become more sedentary and notes generalized fatigue. She has moved to be closer to her daughter after 50 years in her home. She uses oxygen at night time. No Known Allergies   Current Outpatient Medications  Medication Sig Dispense Refill  . ADVAIR DISKUS 250-50 MCG/DOSE AEPB Inhale 1 puff into the lungs 2 (two) times daily. 90 each 3  . ALPRAZolam (XANAX) 0.5 MG tablet TAKE 1 TABLET 3 TIMES A DAY 90 tablet 1  . apixaban (ELIQUIS) 2.5 MG TABS tablet Take 1 tablet (2.5 mg total) by mouth 2 (two) times daily. 180 tablet 1  . Cholecalciferol (VITAMIN D3) 1000 UNITS CAPS Take 1,000 Units by mouth daily.     . cyanocobalamin 500 MCG tablet Take 1 tablet (500 mcg total) by mouth daily.    Marland Kitchen diltiazem (CARDIZEM CD) 120 MG 24 hr capsule TAKE 1 CAPSULE BY MOUTH EVERY DAY 30 capsule 9  . ferrous sulfate 325 (65 FE) MG tablet Take 325 mg by mouth daily with breakfast.      . furosemide (LASIX) 20 MG tablet TAKE 1 TABLET (20 MG TOTAL) BY MOUTH DAILY 90 tablet 0  . levothyroxine (SYNTHROID, LEVOTHROID) 88 MCG tablet Take 1 tablet (88 mcg total) by mouth daily. 90 tablet 1  . metoprolol tartrate (LOPRESSOR) 50 MG tablet Take 1 tablet (50 mg total) by mouth 2 (two) times daily. KEEP OV. 180 tablet 0  . Multiple Vitamins-Minerals (PRESERVISION AREDS 2) CAPS Take 1 capsule by mouth 2 (two) times daily.     Vladimir Faster Glycol-Propyl Glycol (SYSTANE OP) Place 1 drop into both eyes daily as needed (dry eyes).    . potassium chloride SA (KLOR-CON M20) 20 MEQ tablet Take 1 tablet (20 mEq total) by mouth daily. KEEP OV. 90 tablet 0   No current facility-administered medications for this visit.      Past Medical History:  Diagnosis Date  . Anemia     . Asthma   . Atrial fibrillation ([place])   . Atrial flutter ([place])    s/p RFCA  . Diverticulosis   . DJD (degenerative joint disease)    knees  . Esophageal stricture   . GERD (gastroesophageal reflux disease)   . Hearing loss   . HTN (hypertension)    a.  echo 3/06: EF 55-65%, mild LVH, mild RVH;   b.  Echo (06/10/13): EF 50-55%, normal wall motion, MAC, mild MR, mild RPE, PASP 40.  Marland Kitchen Hypothyroidism   . Macular degeneration   . Renal insufficiency   . Symptomatic sinus bradycardia    s/p pacemaker  . Tremor, essential   . Urinary, incontinence, stress female   . Vertigo     ROS:   All systems reviewed and negative except as noted in the HPI.   Past Surgical History:  Procedure Laterality Date  . PACEMAKER INSERTION    . PERMANENT PACEMAKER GENERATOR CHANGE N/A 12/15/2013   Procedure: PERMANENT PACEMAKER GENERATOR CHANGE;  Surgeon: Evans Lance, MD;  Location: [facility] CATH LAB;  Service: Cardiovascular;  Laterality: N/A;  . radiofrequency catheter ablation    . TRANSTHORACIC ECHOCARDIOGRAM  06/10/2013   EF 50-55% Mild MR     Family History  Problem Relation Age of Onset  . Cancer Mother  breast  . Cancer Father        leukemia  . Cirrhosis Brother        deceased     Social History   Socioeconomic History  . Marital status: Widowed    Spouse name: Not on file  . Number of children: 2  . Years of education: Not on file  . Highest education level: Not on file  Occupational History  . Occupation: retired Careers information officer  Social Needs  . Financial resource strain: Not on file  . Food insecurity:    Worry: Not on file    Inability: Not on file  . Transportation needs:    Medical: Not on file    Non-medical: Not on file  Tobacco Use  . Smoking status: Former Smoker    Types: Cigarettes    Last attempt to quit: 02/04/1962    Years since quitting: 56.2  . Smokeless tobacco: Never Used  Substance and Sexual Activity  . Alcohol use: Yes    Comment: rarely   . Drug use: No  . Sexual activity: Never  Lifestyle  . Physical activity:    Days per week: Not on file    Minutes per session: Not on file  . Stress: Not on file  Relationships  . Social connections:    Talks on phone: Not on file    Gets together: Not on file    Attends religious service: Not on file    Active member of club or organization: Not on file    Attends meetings of clubs or organizations: Not on file    Relationship status: Not on file  . Intimate partner violence:    Fear of current or ex partner: Not on file    Emotionally abused: Not on file    Physically abused: Not on file    Forced sexual activity: Not on file  Other Topics Concern  . Not on file  Social History Narrative  . Not on file     BP 118/72   Pulse 77   Ht 5' 4"  (1.626 m)   Wt 124 lb (56.2 kg)   SpO2 96%   BMI 21.28 kg/m   Physical Exam:  Well appearing NAD HEENT: Unremarkable Neck:  No JVD, no thyromegally Lymphatics:  No adenopathy Back:  No CVA tenderness Lungs:  Clear with no wheezes HEART:  Regular rate rhythm, no murmurs, no rubs, no clicks Abd:  soft, positive bowel sounds, no organomegally, no rebound, no guarding Ext:  2 plus pulses, no edema, no cyanosis, no clubbing Skin:  No rashes no nodules Neuro:  CN II through XII intact, motor grossly intact  EKG - atrial fib with a controlled VR  DEVICE  Normal device function.  See PaceArt for details.   Assess/Plan: 1. Atrial fib - she is chronically in fib. Her rates are well controlled.  2. Diastolic heart failure - her symptoms are class 2. She will continue her current meds. 3. PPM - her medtronic PPM is programmed VVIR. She will followup in a year.  [name].D

## 2018-05-03 DIAGNOSIS — R32 Unspecified urinary incontinence: Secondary | ICD-10-CM | POA: Diagnosis not present

## 2018-05-03 DIAGNOSIS — I5031 Acute diastolic (congestive) heart failure: Secondary | ICD-10-CM | POA: Diagnosis not present

## 2018-05-03 DIAGNOSIS — J449 Chronic obstructive pulmonary disease, unspecified: Secondary | ICD-10-CM | POA: Diagnosis not present

## 2018-05-03 DIAGNOSIS — I480 Paroxysmal atrial fibrillation: Secondary | ICD-10-CM | POA: Diagnosis not present

## 2018-05-03 DIAGNOSIS — I130 Hypertensive heart and chronic kidney disease with heart failure and stage 1 through stage 4 chronic kidney disease, or unspecified chronic kidney disease: Secondary | ICD-10-CM | POA: Diagnosis not present

## 2018-05-03 DIAGNOSIS — N189 Chronic kidney disease, unspecified: Secondary | ICD-10-CM | POA: Diagnosis not present

## 2018-05-03 NOTE — Addendum Note (Signed)
Addended by: Rose Phi on: 05/03/2018 05:47 PM   Modules accepted: Orders

## 2018-05-04 DIAGNOSIS — M170 Bilateral primary osteoarthritis of knee: Secondary | ICD-10-CM | POA: Diagnosis not present

## 2018-05-04 DIAGNOSIS — N289 Disorder of kidney and ureter, unspecified: Secondary | ICD-10-CM | POA: Diagnosis not present

## 2018-05-04 DIAGNOSIS — I5031 Acute diastolic (congestive) heart failure: Secondary | ICD-10-CM | POA: Diagnosis not present

## 2018-05-04 DIAGNOSIS — I480 Paroxysmal atrial fibrillation: Secondary | ICD-10-CM | POA: Diagnosis not present

## 2018-05-04 DIAGNOSIS — R32 Unspecified urinary incontinence: Secondary | ICD-10-CM | POA: Diagnosis not present

## 2018-05-04 DIAGNOSIS — K5790 Diverticulosis of intestine, part unspecified, without perforation or abscess without bleeding: Secondary | ICD-10-CM | POA: Diagnosis not present

## 2018-05-04 DIAGNOSIS — Z950 Presence of cardiac pacemaker: Secondary | ICD-10-CM | POA: Diagnosis not present

## 2018-05-04 DIAGNOSIS — J45909 Unspecified asthma, uncomplicated: Secondary | ICD-10-CM | POA: Diagnosis not present

## 2018-05-04 DIAGNOSIS — H3530 Unspecified macular degeneration: Secondary | ICD-10-CM | POA: Diagnosis not present

## 2018-05-04 DIAGNOSIS — D649 Anemia, unspecified: Secondary | ICD-10-CM | POA: Diagnosis not present

## 2018-05-04 DIAGNOSIS — I130 Hypertensive heart and chronic kidney disease with heart failure and stage 1 through stage 4 chronic kidney disease, or unspecified chronic kidney disease: Secondary | ICD-10-CM | POA: Diagnosis not present

## 2018-05-04 DIAGNOSIS — R42 Dizziness and giddiness: Secondary | ICD-10-CM | POA: Diagnosis not present

## 2018-05-04 DIAGNOSIS — R001 Bradycardia, unspecified: Secondary | ICD-10-CM | POA: Diagnosis not present

## 2018-05-04 DIAGNOSIS — K219 Gastro-esophageal reflux disease without esophagitis: Secondary | ICD-10-CM | POA: Diagnosis not present

## 2018-05-04 DIAGNOSIS — E039 Hypothyroidism, unspecified: Secondary | ICD-10-CM | POA: Diagnosis not present

## 2018-05-04 DIAGNOSIS — R251 Tremor, unspecified: Secondary | ICD-10-CM | POA: Diagnosis not present

## 2018-05-04 DIAGNOSIS — H9193 Unspecified hearing loss, bilateral: Secondary | ICD-10-CM | POA: Diagnosis not present

## 2018-05-04 DIAGNOSIS — N189 Chronic kidney disease, unspecified: Secondary | ICD-10-CM | POA: Diagnosis not present

## 2018-05-06 LAB — CUP PACEART INCLINIC DEVICE CHECK
Battery Impedance: 453 Ohm
Battery Remaining Longevity: 97 mo
Battery Voltage: 2.79 V
Brady Statistic RV Percent Paced: 19 %
Date Time Interrogation Session: 20200130201254
Implantable Lead Implant Date: 20051017
Implantable Lead Implant Date: 20051017
Implantable Lead Location: 753859
Implantable Lead Location: 753860
Implantable Lead Model: 5076
Implantable Lead Model: 5076
Implantable Pulse Generator Implant Date: 20150914
Lead Channel Impedance Value: 469 Ohm
Lead Channel Impedance Value: 67 Ohm
Lead Channel Pacing Threshold Amplitude: 1 V
Lead Channel Pacing Threshold Amplitude: 1 V
Lead Channel Pacing Threshold Pulse Width: 0.4 ms
Lead Channel Pacing Threshold Pulse Width: 0.4 ms
Lead Channel Sensing Intrinsic Amplitude: 8 mV
Lead Channel Setting Pacing Amplitude: 2.5 V
Lead Channel Setting Pacing Pulse Width: 0.4 ms
Lead Channel Setting Sensing Sensitivity: 4 mV

## 2018-05-08 DIAGNOSIS — I130 Hypertensive heart and chronic kidney disease with heart failure and stage 1 through stage 4 chronic kidney disease, or unspecified chronic kidney disease: Secondary | ICD-10-CM | POA: Diagnosis not present

## 2018-05-08 DIAGNOSIS — N189 Chronic kidney disease, unspecified: Secondary | ICD-10-CM | POA: Diagnosis not present

## 2018-05-08 DIAGNOSIS — R42 Dizziness and giddiness: Secondary | ICD-10-CM | POA: Diagnosis not present

## 2018-05-08 DIAGNOSIS — I480 Paroxysmal atrial fibrillation: Secondary | ICD-10-CM | POA: Diagnosis not present

## 2018-05-08 DIAGNOSIS — R001 Bradycardia, unspecified: Secondary | ICD-10-CM | POA: Diagnosis not present

## 2018-05-08 DIAGNOSIS — I5031 Acute diastolic (congestive) heart failure: Secondary | ICD-10-CM | POA: Diagnosis not present

## 2018-05-10 DIAGNOSIS — R42 Dizziness and giddiness: Secondary | ICD-10-CM | POA: Diagnosis not present

## 2018-05-10 DIAGNOSIS — R001 Bradycardia, unspecified: Secondary | ICD-10-CM | POA: Diagnosis not present

## 2018-05-10 DIAGNOSIS — I130 Hypertensive heart and chronic kidney disease with heart failure and stage 1 through stage 4 chronic kidney disease, or unspecified chronic kidney disease: Secondary | ICD-10-CM | POA: Diagnosis not present

## 2018-05-10 DIAGNOSIS — I5031 Acute diastolic (congestive) heart failure: Secondary | ICD-10-CM | POA: Diagnosis not present

## 2018-05-10 DIAGNOSIS — N189 Chronic kidney disease, unspecified: Secondary | ICD-10-CM | POA: Diagnosis not present

## 2018-05-10 DIAGNOSIS — I480 Paroxysmal atrial fibrillation: Secondary | ICD-10-CM | POA: Diagnosis not present

## 2018-05-14 DIAGNOSIS — I480 Paroxysmal atrial fibrillation: Secondary | ICD-10-CM | POA: Diagnosis not present

## 2018-05-14 DIAGNOSIS — I130 Hypertensive heart and chronic kidney disease with heart failure and stage 1 through stage 4 chronic kidney disease, or unspecified chronic kidney disease: Secondary | ICD-10-CM | POA: Diagnosis not present

## 2018-05-14 DIAGNOSIS — I5031 Acute diastolic (congestive) heart failure: Secondary | ICD-10-CM | POA: Diagnosis not present

## 2018-05-14 DIAGNOSIS — N189 Chronic kidney disease, unspecified: Secondary | ICD-10-CM | POA: Diagnosis not present

## 2018-05-14 DIAGNOSIS — R001 Bradycardia, unspecified: Secondary | ICD-10-CM | POA: Diagnosis not present

## 2018-05-14 DIAGNOSIS — R42 Dizziness and giddiness: Secondary | ICD-10-CM | POA: Diagnosis not present

## 2018-05-18 ENCOUNTER — Encounter: Payer: Self-pay | Admitting: Internal Medicine

## 2018-05-21 ENCOUNTER — Ambulatory Visit (INDEPENDENT_AMBULATORY_CARE_PROVIDER_SITE_OTHER)

## 2018-05-21 DIAGNOSIS — R001 Bradycardia, unspecified: Secondary | ICD-10-CM | POA: Diagnosis not present

## 2018-05-21 DIAGNOSIS — I495 Sick sinus syndrome: Secondary | ICD-10-CM

## 2018-05-21 DIAGNOSIS — I130 Hypertensive heart and chronic kidney disease with heart failure and stage 1 through stage 4 chronic kidney disease, or unspecified chronic kidney disease: Secondary | ICD-10-CM | POA: Diagnosis not present

## 2018-05-21 DIAGNOSIS — I480 Paroxysmal atrial fibrillation: Secondary | ICD-10-CM | POA: Diagnosis not present

## 2018-05-21 DIAGNOSIS — N189 Chronic kidney disease, unspecified: Secondary | ICD-10-CM | POA: Diagnosis not present

## 2018-05-21 DIAGNOSIS — R42 Dizziness and giddiness: Secondary | ICD-10-CM | POA: Diagnosis not present

## 2018-05-21 DIAGNOSIS — I5031 Acute diastolic (congestive) heart failure: Secondary | ICD-10-CM | POA: Diagnosis not present

## 2018-05-22 ENCOUNTER — Telehealth: Payer: Self-pay

## 2018-05-22 NOTE — Telephone Encounter (Signed)
Spoke with patient to remind of missed remote transmission 

## 2018-05-23 LAB — CUP PACEART REMOTE DEVICE CHECK
Battery Impedance: 454 Ohm
Battery Remaining Longevity: 96 mo
Battery Voltage: 2.79 V
Date Time Interrogation Session: 20200219193001
Implantable Lead Implant Date: 20051017
Implantable Lead Implant Date: 20051017
Implantable Lead Location: 753859
Implantable Lead Location: 753860
Implantable Lead Model: 5076
Implantable Lead Model: 5076
Implantable Pulse Generator Implant Date: 20150914
Lead Channel Impedance Value: 514 Ohm
Lead Channel Impedance Value: 67 Ohm
Lead Channel Pacing Threshold Amplitude: 0.875 V
Lead Channel Pacing Threshold Pulse Width: 0.4 ms
Lead Channel Setting Pacing Amplitude: 2.5 V
Lead Channel Setting Sensing Sensitivity: 5.6 mV
MDC IDC SET LEADCHNL RV PACING PULSEWIDTH: 0.4 ms
MDC IDC STAT BRADY RV PERCENT PACED: 23 %

## 2018-05-29 NOTE — Progress Notes (Signed)
Remote pacemaker transmission.   

## 2018-05-30 DIAGNOSIS — R001 Bradycardia, unspecified: Secondary | ICD-10-CM | POA: Diagnosis not present

## 2018-05-30 DIAGNOSIS — N189 Chronic kidney disease, unspecified: Secondary | ICD-10-CM | POA: Diagnosis not present

## 2018-05-30 DIAGNOSIS — I5031 Acute diastolic (congestive) heart failure: Secondary | ICD-10-CM | POA: Diagnosis not present

## 2018-05-30 DIAGNOSIS — I480 Paroxysmal atrial fibrillation: Secondary | ICD-10-CM | POA: Diagnosis not present

## 2018-05-30 DIAGNOSIS — R42 Dizziness and giddiness: Secondary | ICD-10-CM | POA: Diagnosis not present

## 2018-05-30 DIAGNOSIS — I130 Hypertensive heart and chronic kidney disease with heart failure and stage 1 through stage 4 chronic kidney disease, or unspecified chronic kidney disease: Secondary | ICD-10-CM | POA: Diagnosis not present

## 2018-05-31 DIAGNOSIS — R42 Dizziness and giddiness: Secondary | ICD-10-CM | POA: Diagnosis not present

## 2018-05-31 DIAGNOSIS — N189 Chronic kidney disease, unspecified: Secondary | ICD-10-CM | POA: Diagnosis not present

## 2018-05-31 DIAGNOSIS — R001 Bradycardia, unspecified: Secondary | ICD-10-CM | POA: Diagnosis not present

## 2018-05-31 DIAGNOSIS — I130 Hypertensive heart and chronic kidney disease with heart failure and stage 1 through stage 4 chronic kidney disease, or unspecified chronic kidney disease: Secondary | ICD-10-CM | POA: Diagnosis not present

## 2018-05-31 DIAGNOSIS — I480 Paroxysmal atrial fibrillation: Secondary | ICD-10-CM | POA: Diagnosis not present

## 2018-05-31 DIAGNOSIS — I5031 Acute diastolic (congestive) heart failure: Secondary | ICD-10-CM | POA: Diagnosis not present

## 2018-06-02 DIAGNOSIS — R42 Dizziness and giddiness: Secondary | ICD-10-CM | POA: Diagnosis not present

## 2018-06-02 DIAGNOSIS — N289 Disorder of kidney and ureter, unspecified: Secondary | ICD-10-CM | POA: Diagnosis not present

## 2018-06-02 DIAGNOSIS — N189 Chronic kidney disease, unspecified: Secondary | ICD-10-CM | POA: Diagnosis not present

## 2018-06-02 DIAGNOSIS — I130 Hypertensive heart and chronic kidney disease with heart failure and stage 1 through stage 4 chronic kidney disease, or unspecified chronic kidney disease: Secondary | ICD-10-CM | POA: Diagnosis not present

## 2018-06-02 DIAGNOSIS — M170 Bilateral primary osteoarthritis of knee: Secondary | ICD-10-CM | POA: Diagnosis not present

## 2018-06-02 DIAGNOSIS — K5790 Diverticulosis of intestine, part unspecified, without perforation or abscess without bleeding: Secondary | ICD-10-CM | POA: Diagnosis not present

## 2018-06-02 DIAGNOSIS — R32 Unspecified urinary incontinence: Secondary | ICD-10-CM | POA: Diagnosis not present

## 2018-06-02 DIAGNOSIS — I5031 Acute diastolic (congestive) heart failure: Secondary | ICD-10-CM | POA: Diagnosis not present

## 2018-06-02 DIAGNOSIS — R251 Tremor, unspecified: Secondary | ICD-10-CM | POA: Diagnosis not present

## 2018-06-02 DIAGNOSIS — D649 Anemia, unspecified: Secondary | ICD-10-CM | POA: Diagnosis not present

## 2018-06-02 DIAGNOSIS — E039 Hypothyroidism, unspecified: Secondary | ICD-10-CM | POA: Diagnosis not present

## 2018-06-02 DIAGNOSIS — K219 Gastro-esophageal reflux disease without esophagitis: Secondary | ICD-10-CM | POA: Diagnosis not present

## 2018-06-02 DIAGNOSIS — Z950 Presence of cardiac pacemaker: Secondary | ICD-10-CM | POA: Diagnosis not present

## 2018-06-02 DIAGNOSIS — H9193 Unspecified hearing loss, bilateral: Secondary | ICD-10-CM | POA: Diagnosis not present

## 2018-06-02 DIAGNOSIS — H3530 Unspecified macular degeneration: Secondary | ICD-10-CM | POA: Diagnosis not present

## 2018-06-02 DIAGNOSIS — R001 Bradycardia, unspecified: Secondary | ICD-10-CM | POA: Diagnosis not present

## 2018-06-02 DIAGNOSIS — J45909 Unspecified asthma, uncomplicated: Secondary | ICD-10-CM | POA: Diagnosis not present

## 2018-06-02 DIAGNOSIS — I480 Paroxysmal atrial fibrillation: Secondary | ICD-10-CM | POA: Diagnosis not present

## 2018-06-03 ENCOUNTER — Encounter: Payer: Self-pay | Admitting: Cardiology

## 2018-06-05 DIAGNOSIS — R42 Dizziness and giddiness: Secondary | ICD-10-CM | POA: Diagnosis not present

## 2018-06-05 DIAGNOSIS — N189 Chronic kidney disease, unspecified: Secondary | ICD-10-CM | POA: Diagnosis not present

## 2018-06-05 DIAGNOSIS — I130 Hypertensive heart and chronic kidney disease with heart failure and stage 1 through stage 4 chronic kidney disease, or unspecified chronic kidney disease: Secondary | ICD-10-CM | POA: Diagnosis not present

## 2018-06-05 DIAGNOSIS — R001 Bradycardia, unspecified: Secondary | ICD-10-CM | POA: Diagnosis not present

## 2018-06-05 DIAGNOSIS — I5031 Acute diastolic (congestive) heart failure: Secondary | ICD-10-CM | POA: Diagnosis not present

## 2018-06-05 DIAGNOSIS — I480 Paroxysmal atrial fibrillation: Secondary | ICD-10-CM | POA: Diagnosis not present

## 2018-06-13 DIAGNOSIS — I130 Hypertensive heart and chronic kidney disease with heart failure and stage 1 through stage 4 chronic kidney disease, or unspecified chronic kidney disease: Secondary | ICD-10-CM | POA: Diagnosis not present

## 2018-06-13 DIAGNOSIS — N189 Chronic kidney disease, unspecified: Secondary | ICD-10-CM | POA: Diagnosis not present

## 2018-06-13 DIAGNOSIS — I5031 Acute diastolic (congestive) heart failure: Secondary | ICD-10-CM | POA: Diagnosis not present

## 2018-06-13 DIAGNOSIS — R001 Bradycardia, unspecified: Secondary | ICD-10-CM | POA: Diagnosis not present

## 2018-06-13 DIAGNOSIS — I480 Paroxysmal atrial fibrillation: Secondary | ICD-10-CM | POA: Diagnosis not present

## 2018-06-13 DIAGNOSIS — R42 Dizziness and giddiness: Secondary | ICD-10-CM | POA: Diagnosis not present

## 2018-06-14 ENCOUNTER — Other Ambulatory Visit: Payer: Self-pay | Admitting: Cardiology

## 2018-06-19 DIAGNOSIS — I130 Hypertensive heart and chronic kidney disease with heart failure and stage 1 through stage 4 chronic kidney disease, or unspecified chronic kidney disease: Secondary | ICD-10-CM | POA: Diagnosis not present

## 2018-06-19 DIAGNOSIS — N189 Chronic kidney disease, unspecified: Secondary | ICD-10-CM | POA: Diagnosis not present

## 2018-06-19 DIAGNOSIS — R42 Dizziness and giddiness: Secondary | ICD-10-CM | POA: Diagnosis not present

## 2018-06-19 DIAGNOSIS — I480 Paroxysmal atrial fibrillation: Secondary | ICD-10-CM | POA: Diagnosis not present

## 2018-06-19 DIAGNOSIS — I5031 Acute diastolic (congestive) heart failure: Secondary | ICD-10-CM | POA: Diagnosis not present

## 2018-06-19 DIAGNOSIS — R001 Bradycardia, unspecified: Secondary | ICD-10-CM | POA: Diagnosis not present

## 2018-06-27 DIAGNOSIS — I130 Hypertensive heart and chronic kidney disease with heart failure and stage 1 through stage 4 chronic kidney disease, or unspecified chronic kidney disease: Secondary | ICD-10-CM | POA: Diagnosis not present

## 2018-06-27 DIAGNOSIS — I480 Paroxysmal atrial fibrillation: Secondary | ICD-10-CM | POA: Diagnosis not present

## 2018-06-27 DIAGNOSIS — R001 Bradycardia, unspecified: Secondary | ICD-10-CM | POA: Diagnosis not present

## 2018-06-27 DIAGNOSIS — I5031 Acute diastolic (congestive) heart failure: Secondary | ICD-10-CM | POA: Diagnosis not present

## 2018-06-27 DIAGNOSIS — N189 Chronic kidney disease, unspecified: Secondary | ICD-10-CM | POA: Diagnosis not present

## 2018-06-27 DIAGNOSIS — R42 Dizziness and giddiness: Secondary | ICD-10-CM | POA: Diagnosis not present

## 2018-07-01 DIAGNOSIS — I480 Paroxysmal atrial fibrillation: Secondary | ICD-10-CM | POA: Diagnosis not present

## 2018-07-01 DIAGNOSIS — N189 Chronic kidney disease, unspecified: Secondary | ICD-10-CM | POA: Diagnosis not present

## 2018-07-01 DIAGNOSIS — R42 Dizziness and giddiness: Secondary | ICD-10-CM | POA: Diagnosis not present

## 2018-07-01 DIAGNOSIS — R001 Bradycardia, unspecified: Secondary | ICD-10-CM | POA: Diagnosis not present

## 2018-07-01 DIAGNOSIS — I130 Hypertensive heart and chronic kidney disease with heart failure and stage 1 through stage 4 chronic kidney disease, or unspecified chronic kidney disease: Secondary | ICD-10-CM | POA: Diagnosis not present

## 2018-07-01 DIAGNOSIS — I5031 Acute diastolic (congestive) heart failure: Secondary | ICD-10-CM | POA: Diagnosis not present

## 2018-07-02 DIAGNOSIS — I130 Hypertensive heart and chronic kidney disease with heart failure and stage 1 through stage 4 chronic kidney disease, or unspecified chronic kidney disease: Secondary | ICD-10-CM | POA: Diagnosis not present

## 2018-07-02 DIAGNOSIS — R001 Bradycardia, unspecified: Secondary | ICD-10-CM | POA: Diagnosis not present

## 2018-07-02 DIAGNOSIS — I5031 Acute diastolic (congestive) heart failure: Secondary | ICD-10-CM | POA: Diagnosis not present

## 2018-07-02 DIAGNOSIS — N189 Chronic kidney disease, unspecified: Secondary | ICD-10-CM | POA: Diagnosis not present

## 2018-07-02 DIAGNOSIS — R42 Dizziness and giddiness: Secondary | ICD-10-CM | POA: Diagnosis not present

## 2018-07-02 DIAGNOSIS — I480 Paroxysmal atrial fibrillation: Secondary | ICD-10-CM | POA: Diagnosis not present

## 2018-07-03 DIAGNOSIS — H3530 Unspecified macular degeneration: Secondary | ICD-10-CM | POA: Diagnosis not present

## 2018-07-03 DIAGNOSIS — R001 Bradycardia, unspecified: Secondary | ICD-10-CM | POA: Diagnosis not present

## 2018-07-03 DIAGNOSIS — R32 Unspecified urinary incontinence: Secondary | ICD-10-CM | POA: Diagnosis not present

## 2018-07-03 DIAGNOSIS — K5790 Diverticulosis of intestine, part unspecified, without perforation or abscess without bleeding: Secondary | ICD-10-CM | POA: Diagnosis not present

## 2018-07-03 DIAGNOSIS — N289 Disorder of kidney and ureter, unspecified: Secondary | ICD-10-CM | POA: Diagnosis not present

## 2018-07-03 DIAGNOSIS — R251 Tremor, unspecified: Secondary | ICD-10-CM | POA: Diagnosis not present

## 2018-07-03 DIAGNOSIS — E039 Hypothyroidism, unspecified: Secondary | ICD-10-CM | POA: Diagnosis not present

## 2018-07-03 DIAGNOSIS — Z950 Presence of cardiac pacemaker: Secondary | ICD-10-CM | POA: Diagnosis not present

## 2018-07-03 DIAGNOSIS — I480 Paroxysmal atrial fibrillation: Secondary | ICD-10-CM | POA: Diagnosis not present

## 2018-07-03 DIAGNOSIS — D649 Anemia, unspecified: Secondary | ICD-10-CM | POA: Diagnosis not present

## 2018-07-03 DIAGNOSIS — K219 Gastro-esophageal reflux disease without esophagitis: Secondary | ICD-10-CM | POA: Diagnosis not present

## 2018-07-03 DIAGNOSIS — I130 Hypertensive heart and chronic kidney disease with heart failure and stage 1 through stage 4 chronic kidney disease, or unspecified chronic kidney disease: Secondary | ICD-10-CM | POA: Diagnosis not present

## 2018-07-03 DIAGNOSIS — R42 Dizziness and giddiness: Secondary | ICD-10-CM | POA: Diagnosis not present

## 2018-07-03 DIAGNOSIS — H9193 Unspecified hearing loss, bilateral: Secondary | ICD-10-CM | POA: Diagnosis not present

## 2018-07-03 DIAGNOSIS — I5031 Acute diastolic (congestive) heart failure: Secondary | ICD-10-CM | POA: Diagnosis not present

## 2018-07-03 DIAGNOSIS — N189 Chronic kidney disease, unspecified: Secondary | ICD-10-CM | POA: Diagnosis not present

## 2018-07-03 DIAGNOSIS — J45909 Unspecified asthma, uncomplicated: Secondary | ICD-10-CM | POA: Diagnosis not present

## 2018-07-03 DIAGNOSIS — M170 Bilateral primary osteoarthritis of knee: Secondary | ICD-10-CM | POA: Diagnosis not present

## 2018-07-08 DIAGNOSIS — N189 Chronic kidney disease, unspecified: Secondary | ICD-10-CM | POA: Diagnosis not present

## 2018-07-08 DIAGNOSIS — R001 Bradycardia, unspecified: Secondary | ICD-10-CM | POA: Diagnosis not present

## 2018-07-08 DIAGNOSIS — R42 Dizziness and giddiness: Secondary | ICD-10-CM | POA: Diagnosis not present

## 2018-07-08 DIAGNOSIS — I5031 Acute diastolic (congestive) heart failure: Secondary | ICD-10-CM | POA: Diagnosis not present

## 2018-07-08 DIAGNOSIS — I130 Hypertensive heart and chronic kidney disease with heart failure and stage 1 through stage 4 chronic kidney disease, or unspecified chronic kidney disease: Secondary | ICD-10-CM | POA: Diagnosis not present

## 2018-07-08 DIAGNOSIS — I480 Paroxysmal atrial fibrillation: Secondary | ICD-10-CM | POA: Diagnosis not present

## 2018-07-15 DIAGNOSIS — I480 Paroxysmal atrial fibrillation: Secondary | ICD-10-CM | POA: Diagnosis not present

## 2018-07-15 DIAGNOSIS — I130 Hypertensive heart and chronic kidney disease with heart failure and stage 1 through stage 4 chronic kidney disease, or unspecified chronic kidney disease: Secondary | ICD-10-CM | POA: Diagnosis not present

## 2018-07-15 DIAGNOSIS — R001 Bradycardia, unspecified: Secondary | ICD-10-CM | POA: Diagnosis not present

## 2018-07-15 DIAGNOSIS — N189 Chronic kidney disease, unspecified: Secondary | ICD-10-CM | POA: Diagnosis not present

## 2018-07-15 DIAGNOSIS — R42 Dizziness and giddiness: Secondary | ICD-10-CM | POA: Diagnosis not present

## 2018-07-15 DIAGNOSIS — I5031 Acute diastolic (congestive) heart failure: Secondary | ICD-10-CM | POA: Diagnosis not present

## 2018-07-17 DIAGNOSIS — R42 Dizziness and giddiness: Secondary | ICD-10-CM | POA: Diagnosis not present

## 2018-07-17 DIAGNOSIS — R001 Bradycardia, unspecified: Secondary | ICD-10-CM | POA: Diagnosis not present

## 2018-07-17 DIAGNOSIS — N189 Chronic kidney disease, unspecified: Secondary | ICD-10-CM | POA: Diagnosis not present

## 2018-07-17 DIAGNOSIS — I480 Paroxysmal atrial fibrillation: Secondary | ICD-10-CM | POA: Diagnosis not present

## 2018-07-17 DIAGNOSIS — I5031 Acute diastolic (congestive) heart failure: Secondary | ICD-10-CM | POA: Diagnosis not present

## 2018-07-17 DIAGNOSIS — I130 Hypertensive heart and chronic kidney disease with heart failure and stage 1 through stage 4 chronic kidney disease, or unspecified chronic kidney disease: Secondary | ICD-10-CM | POA: Diagnosis not present

## 2018-08-02 DIAGNOSIS — K5790 Diverticulosis of intestine, part unspecified, without perforation or abscess without bleeding: Secondary | ICD-10-CM | POA: Diagnosis not present

## 2018-08-02 DIAGNOSIS — N189 Chronic kidney disease, unspecified: Secondary | ICD-10-CM | POA: Diagnosis not present

## 2018-08-02 DIAGNOSIS — I5031 Acute diastolic (congestive) heart failure: Secondary | ICD-10-CM | POA: Diagnosis not present

## 2018-08-02 DIAGNOSIS — R42 Dizziness and giddiness: Secondary | ICD-10-CM | POA: Diagnosis not present

## 2018-08-02 DIAGNOSIS — Z950 Presence of cardiac pacemaker: Secondary | ICD-10-CM | POA: Diagnosis not present

## 2018-08-02 DIAGNOSIS — N289 Disorder of kidney and ureter, unspecified: Secondary | ICD-10-CM | POA: Diagnosis not present

## 2018-08-02 DIAGNOSIS — K219 Gastro-esophageal reflux disease without esophagitis: Secondary | ICD-10-CM | POA: Diagnosis not present

## 2018-08-02 DIAGNOSIS — H3530 Unspecified macular degeneration: Secondary | ICD-10-CM | POA: Diagnosis not present

## 2018-08-02 DIAGNOSIS — R32 Unspecified urinary incontinence: Secondary | ICD-10-CM | POA: Diagnosis not present

## 2018-08-02 DIAGNOSIS — I480 Paroxysmal atrial fibrillation: Secondary | ICD-10-CM | POA: Diagnosis not present

## 2018-08-02 DIAGNOSIS — E039 Hypothyroidism, unspecified: Secondary | ICD-10-CM | POA: Diagnosis not present

## 2018-08-02 DIAGNOSIS — J45909 Unspecified asthma, uncomplicated: Secondary | ICD-10-CM | POA: Diagnosis not present

## 2018-08-02 DIAGNOSIS — R251 Tremor, unspecified: Secondary | ICD-10-CM | POA: Diagnosis not present

## 2018-08-02 DIAGNOSIS — D649 Anemia, unspecified: Secondary | ICD-10-CM | POA: Diagnosis not present

## 2018-08-02 DIAGNOSIS — R001 Bradycardia, unspecified: Secondary | ICD-10-CM | POA: Diagnosis not present

## 2018-08-02 DIAGNOSIS — M170 Bilateral primary osteoarthritis of knee: Secondary | ICD-10-CM | POA: Diagnosis not present

## 2018-08-02 DIAGNOSIS — H9193 Unspecified hearing loss, bilateral: Secondary | ICD-10-CM | POA: Diagnosis not present

## 2018-08-02 DIAGNOSIS — I130 Hypertensive heart and chronic kidney disease with heart failure and stage 1 through stage 4 chronic kidney disease, or unspecified chronic kidney disease: Secondary | ICD-10-CM | POA: Diagnosis not present

## 2018-08-06 DIAGNOSIS — I480 Paroxysmal atrial fibrillation: Secondary | ICD-10-CM | POA: Diagnosis not present

## 2018-08-06 DIAGNOSIS — N189 Chronic kidney disease, unspecified: Secondary | ICD-10-CM | POA: Diagnosis not present

## 2018-08-06 DIAGNOSIS — R42 Dizziness and giddiness: Secondary | ICD-10-CM | POA: Diagnosis not present

## 2018-08-06 DIAGNOSIS — I5031 Acute diastolic (congestive) heart failure: Secondary | ICD-10-CM | POA: Diagnosis not present

## 2018-08-06 DIAGNOSIS — I130 Hypertensive heart and chronic kidney disease with heart failure and stage 1 through stage 4 chronic kidney disease, or unspecified chronic kidney disease: Secondary | ICD-10-CM | POA: Diagnosis not present

## 2018-08-06 DIAGNOSIS — R001 Bradycardia, unspecified: Secondary | ICD-10-CM | POA: Diagnosis not present

## 2018-08-09 DIAGNOSIS — H6123 Impacted cerumen, bilateral: Secondary | ICD-10-CM | POA: Diagnosis not present

## 2018-08-13 DIAGNOSIS — I480 Paroxysmal atrial fibrillation: Secondary | ICD-10-CM | POA: Diagnosis not present

## 2018-08-13 DIAGNOSIS — N189 Chronic kidney disease, unspecified: Secondary | ICD-10-CM | POA: Diagnosis not present

## 2018-08-13 DIAGNOSIS — R001 Bradycardia, unspecified: Secondary | ICD-10-CM | POA: Diagnosis not present

## 2018-08-13 DIAGNOSIS — I130 Hypertensive heart and chronic kidney disease with heart failure and stage 1 through stage 4 chronic kidney disease, or unspecified chronic kidney disease: Secondary | ICD-10-CM | POA: Diagnosis not present

## 2018-08-13 DIAGNOSIS — I5031 Acute diastolic (congestive) heart failure: Secondary | ICD-10-CM | POA: Diagnosis not present

## 2018-08-13 DIAGNOSIS — R42 Dizziness and giddiness: Secondary | ICD-10-CM | POA: Diagnosis not present

## 2018-08-15 DIAGNOSIS — I130 Hypertensive heart and chronic kidney disease with heart failure and stage 1 through stage 4 chronic kidney disease, or unspecified chronic kidney disease: Secondary | ICD-10-CM | POA: Diagnosis not present

## 2018-08-15 DIAGNOSIS — R001 Bradycardia, unspecified: Secondary | ICD-10-CM | POA: Diagnosis not present

## 2018-08-15 DIAGNOSIS — R42 Dizziness and giddiness: Secondary | ICD-10-CM | POA: Diagnosis not present

## 2018-08-15 DIAGNOSIS — I480 Paroxysmal atrial fibrillation: Secondary | ICD-10-CM | POA: Diagnosis not present

## 2018-08-15 DIAGNOSIS — I5031 Acute diastolic (congestive) heart failure: Secondary | ICD-10-CM | POA: Diagnosis not present

## 2018-08-15 DIAGNOSIS — N189 Chronic kidney disease, unspecified: Secondary | ICD-10-CM | POA: Diagnosis not present

## 2018-08-18 DIAGNOSIS — R001 Bradycardia, unspecified: Secondary | ICD-10-CM | POA: Diagnosis not present

## 2018-08-18 DIAGNOSIS — I130 Hypertensive heart and chronic kidney disease with heart failure and stage 1 through stage 4 chronic kidney disease, or unspecified chronic kidney disease: Secondary | ICD-10-CM | POA: Diagnosis not present

## 2018-08-18 DIAGNOSIS — N189 Chronic kidney disease, unspecified: Secondary | ICD-10-CM | POA: Diagnosis not present

## 2018-08-18 DIAGNOSIS — I480 Paroxysmal atrial fibrillation: Secondary | ICD-10-CM | POA: Diagnosis not present

## 2018-08-18 DIAGNOSIS — R42 Dizziness and giddiness: Secondary | ICD-10-CM | POA: Diagnosis not present

## 2018-08-18 DIAGNOSIS — I5031 Acute diastolic (congestive) heart failure: Secondary | ICD-10-CM | POA: Diagnosis not present

## 2018-08-19 DIAGNOSIS — R001 Bradycardia, unspecified: Secondary | ICD-10-CM | POA: Diagnosis not present

## 2018-08-19 DIAGNOSIS — I5031 Acute diastolic (congestive) heart failure: Secondary | ICD-10-CM | POA: Diagnosis not present

## 2018-08-19 DIAGNOSIS — R42 Dizziness and giddiness: Secondary | ICD-10-CM | POA: Diagnosis not present

## 2018-08-19 DIAGNOSIS — I130 Hypertensive heart and chronic kidney disease with heart failure and stage 1 through stage 4 chronic kidney disease, or unspecified chronic kidney disease: Secondary | ICD-10-CM | POA: Diagnosis not present

## 2018-08-19 DIAGNOSIS — I480 Paroxysmal atrial fibrillation: Secondary | ICD-10-CM | POA: Diagnosis not present

## 2018-08-19 DIAGNOSIS — N189 Chronic kidney disease, unspecified: Secondary | ICD-10-CM | POA: Diagnosis not present

## 2018-08-20 ENCOUNTER — Other Ambulatory Visit: Payer: Self-pay

## 2018-08-20 ENCOUNTER — Encounter: Admitting: *Deleted

## 2018-08-20 DIAGNOSIS — N189 Chronic kidney disease, unspecified: Secondary | ICD-10-CM | POA: Diagnosis not present

## 2018-08-20 DIAGNOSIS — I130 Hypertensive heart and chronic kidney disease with heart failure and stage 1 through stage 4 chronic kidney disease, or unspecified chronic kidney disease: Secondary | ICD-10-CM | POA: Diagnosis not present

## 2018-08-20 DIAGNOSIS — I5031 Acute diastolic (congestive) heart failure: Secondary | ICD-10-CM | POA: Diagnosis not present

## 2018-08-20 DIAGNOSIS — I480 Paroxysmal atrial fibrillation: Secondary | ICD-10-CM | POA: Diagnosis not present

## 2018-08-20 DIAGNOSIS — R42 Dizziness and giddiness: Secondary | ICD-10-CM | POA: Diagnosis not present

## 2018-08-20 DIAGNOSIS — R001 Bradycardia, unspecified: Secondary | ICD-10-CM | POA: Diagnosis not present

## 2018-08-21 ENCOUNTER — Telehealth: Payer: Self-pay

## 2018-08-21 DIAGNOSIS — N189 Chronic kidney disease, unspecified: Secondary | ICD-10-CM | POA: Diagnosis not present

## 2018-08-21 DIAGNOSIS — I480 Paroxysmal atrial fibrillation: Secondary | ICD-10-CM | POA: Diagnosis not present

## 2018-08-21 DIAGNOSIS — I5031 Acute diastolic (congestive) heart failure: Secondary | ICD-10-CM | POA: Diagnosis not present

## 2018-08-21 DIAGNOSIS — R001 Bradycardia, unspecified: Secondary | ICD-10-CM | POA: Diagnosis not present

## 2018-08-21 DIAGNOSIS — I130 Hypertensive heart and chronic kidney disease with heart failure and stage 1 through stage 4 chronic kidney disease, or unspecified chronic kidney disease: Secondary | ICD-10-CM | POA: Diagnosis not present

## 2018-08-21 DIAGNOSIS — R42 Dizziness and giddiness: Secondary | ICD-10-CM | POA: Diagnosis not present

## 2018-08-21 NOTE — Telephone Encounter (Signed)
Left message for patient to remind of missed remote transmission.  

## 2018-08-22 DIAGNOSIS — I480 Paroxysmal atrial fibrillation: Secondary | ICD-10-CM | POA: Diagnosis not present

## 2018-08-22 DIAGNOSIS — R58 Hemorrhage, not elsewhere classified: Secondary | ICD-10-CM | POA: Diagnosis not present

## 2018-08-22 DIAGNOSIS — N189 Chronic kidney disease, unspecified: Secondary | ICD-10-CM | POA: Diagnosis not present

## 2018-08-22 DIAGNOSIS — R42 Dizziness and giddiness: Secondary | ICD-10-CM | POA: Diagnosis not present

## 2018-08-22 DIAGNOSIS — R001 Bradycardia, unspecified: Secondary | ICD-10-CM | POA: Diagnosis not present

## 2018-08-22 DIAGNOSIS — I5031 Acute diastolic (congestive) heart failure: Secondary | ICD-10-CM | POA: Diagnosis not present

## 2018-08-22 DIAGNOSIS — W19XXXA Unspecified fall, initial encounter: Secondary | ICD-10-CM | POA: Diagnosis not present

## 2018-08-22 DIAGNOSIS — I130 Hypertensive heart and chronic kidney disease with heart failure and stage 1 through stage 4 chronic kidney disease, or unspecified chronic kidney disease: Secondary | ICD-10-CM | POA: Diagnosis not present

## 2018-08-23 DIAGNOSIS — Z7901 Long term (current) use of anticoagulants: Secondary | ICD-10-CM | POA: Diagnosis not present

## 2018-08-23 DIAGNOSIS — M19042 Primary osteoarthritis, left hand: Secondary | ICD-10-CM | POA: Diagnosis not present

## 2018-08-23 DIAGNOSIS — N189 Chronic kidney disease, unspecified: Secondary | ICD-10-CM | POA: Diagnosis not present

## 2018-08-23 DIAGNOSIS — I5031 Acute diastolic (congestive) heart failure: Secondary | ICD-10-CM | POA: Diagnosis not present

## 2018-08-23 DIAGNOSIS — R001 Bradycardia, unspecified: Secondary | ICD-10-CM | POA: Diagnosis not present

## 2018-08-23 DIAGNOSIS — S61213A Laceration without foreign body of left middle finger without damage to nail, initial encounter: Secondary | ICD-10-CM | POA: Diagnosis not present

## 2018-08-23 DIAGNOSIS — S61215A Laceration without foreign body of left ring finger without damage to nail, initial encounter: Secondary | ICD-10-CM | POA: Diagnosis not present

## 2018-08-23 DIAGNOSIS — R42 Dizziness and giddiness: Secondary | ICD-10-CM | POA: Diagnosis not present

## 2018-08-23 DIAGNOSIS — G8911 Acute pain due to trauma: Secondary | ICD-10-CM | POA: Diagnosis not present

## 2018-08-23 DIAGNOSIS — S61217A Laceration without foreign body of left little finger without damage to nail, initial encounter: Secondary | ICD-10-CM | POA: Diagnosis not present

## 2018-08-23 DIAGNOSIS — I480 Paroxysmal atrial fibrillation: Secondary | ICD-10-CM | POA: Diagnosis not present

## 2018-08-23 DIAGNOSIS — I130 Hypertensive heart and chronic kidney disease with heart failure and stage 1 through stage 4 chronic kidney disease, or unspecified chronic kidney disease: Secondary | ICD-10-CM | POA: Diagnosis not present

## 2018-08-24 DIAGNOSIS — R001 Bradycardia, unspecified: Secondary | ICD-10-CM | POA: Diagnosis not present

## 2018-08-24 DIAGNOSIS — I480 Paroxysmal atrial fibrillation: Secondary | ICD-10-CM | POA: Diagnosis not present

## 2018-08-24 DIAGNOSIS — R42 Dizziness and giddiness: Secondary | ICD-10-CM | POA: Diagnosis not present

## 2018-08-24 DIAGNOSIS — I5031 Acute diastolic (congestive) heart failure: Secondary | ICD-10-CM | POA: Diagnosis not present

## 2018-08-24 DIAGNOSIS — I130 Hypertensive heart and chronic kidney disease with heart failure and stage 1 through stage 4 chronic kidney disease, or unspecified chronic kidney disease: Secondary | ICD-10-CM | POA: Diagnosis not present

## 2018-08-24 DIAGNOSIS — N189 Chronic kidney disease, unspecified: Secondary | ICD-10-CM | POA: Diagnosis not present

## 2018-08-26 DIAGNOSIS — I480 Paroxysmal atrial fibrillation: Secondary | ICD-10-CM | POA: Diagnosis not present

## 2018-08-26 DIAGNOSIS — N189 Chronic kidney disease, unspecified: Secondary | ICD-10-CM | POA: Diagnosis not present

## 2018-08-26 DIAGNOSIS — R42 Dizziness and giddiness: Secondary | ICD-10-CM | POA: Diagnosis not present

## 2018-08-26 DIAGNOSIS — I5031 Acute diastolic (congestive) heart failure: Secondary | ICD-10-CM | POA: Diagnosis not present

## 2018-08-26 DIAGNOSIS — R001 Bradycardia, unspecified: Secondary | ICD-10-CM | POA: Diagnosis not present

## 2018-08-26 DIAGNOSIS — I130 Hypertensive heart and chronic kidney disease with heart failure and stage 1 through stage 4 chronic kidney disease, or unspecified chronic kidney disease: Secondary | ICD-10-CM | POA: Diagnosis not present

## 2018-08-27 DIAGNOSIS — R001 Bradycardia, unspecified: Secondary | ICD-10-CM | POA: Diagnosis not present

## 2018-08-27 DIAGNOSIS — I480 Paroxysmal atrial fibrillation: Secondary | ICD-10-CM | POA: Diagnosis not present

## 2018-08-27 DIAGNOSIS — I5031 Acute diastolic (congestive) heart failure: Secondary | ICD-10-CM | POA: Diagnosis not present

## 2018-08-27 DIAGNOSIS — I130 Hypertensive heart and chronic kidney disease with heart failure and stage 1 through stage 4 chronic kidney disease, or unspecified chronic kidney disease: Secondary | ICD-10-CM | POA: Diagnosis not present

## 2018-08-27 DIAGNOSIS — R42 Dizziness and giddiness: Secondary | ICD-10-CM | POA: Diagnosis not present

## 2018-08-27 DIAGNOSIS — N189 Chronic kidney disease, unspecified: Secondary | ICD-10-CM | POA: Diagnosis not present

## 2018-08-28 DIAGNOSIS — R42 Dizziness and giddiness: Secondary | ICD-10-CM | POA: Diagnosis not present

## 2018-08-28 DIAGNOSIS — N189 Chronic kidney disease, unspecified: Secondary | ICD-10-CM | POA: Diagnosis not present

## 2018-08-28 DIAGNOSIS — I480 Paroxysmal atrial fibrillation: Secondary | ICD-10-CM | POA: Diagnosis not present

## 2018-08-28 DIAGNOSIS — I130 Hypertensive heart and chronic kidney disease with heart failure and stage 1 through stage 4 chronic kidney disease, or unspecified chronic kidney disease: Secondary | ICD-10-CM | POA: Diagnosis not present

## 2018-08-28 DIAGNOSIS — R001 Bradycardia, unspecified: Secondary | ICD-10-CM | POA: Diagnosis not present

## 2018-08-28 DIAGNOSIS — I5031 Acute diastolic (congestive) heart failure: Secondary | ICD-10-CM | POA: Diagnosis not present

## 2018-08-29 ENCOUNTER — Encounter: Payer: Self-pay | Admitting: Cardiology

## 2018-08-29 DIAGNOSIS — I130 Hypertensive heart and chronic kidney disease with heart failure and stage 1 through stage 4 chronic kidney disease, or unspecified chronic kidney disease: Secondary | ICD-10-CM | POA: Diagnosis not present

## 2018-08-29 DIAGNOSIS — N189 Chronic kidney disease, unspecified: Secondary | ICD-10-CM | POA: Diagnosis not present

## 2018-08-29 DIAGNOSIS — I480 Paroxysmal atrial fibrillation: Secondary | ICD-10-CM | POA: Diagnosis not present

## 2018-08-29 DIAGNOSIS — R42 Dizziness and giddiness: Secondary | ICD-10-CM | POA: Diagnosis not present

## 2018-08-29 DIAGNOSIS — R001 Bradycardia, unspecified: Secondary | ICD-10-CM | POA: Diagnosis not present

## 2018-08-29 DIAGNOSIS — I5031 Acute diastolic (congestive) heart failure: Secondary | ICD-10-CM | POA: Diagnosis not present

## 2018-08-30 DIAGNOSIS — I480 Paroxysmal atrial fibrillation: Secondary | ICD-10-CM | POA: Diagnosis not present

## 2018-08-30 DIAGNOSIS — R42 Dizziness and giddiness: Secondary | ICD-10-CM | POA: Diagnosis not present

## 2018-08-30 DIAGNOSIS — I5031 Acute diastolic (congestive) heart failure: Secondary | ICD-10-CM | POA: Diagnosis not present

## 2018-08-30 DIAGNOSIS — R001 Bradycardia, unspecified: Secondary | ICD-10-CM | POA: Diagnosis not present

## 2018-08-30 DIAGNOSIS — I130 Hypertensive heart and chronic kidney disease with heart failure and stage 1 through stage 4 chronic kidney disease, or unspecified chronic kidney disease: Secondary | ICD-10-CM | POA: Diagnosis not present

## 2018-08-30 DIAGNOSIS — N189 Chronic kidney disease, unspecified: Secondary | ICD-10-CM | POA: Diagnosis not present

## 2018-09-02 DIAGNOSIS — R32 Unspecified urinary incontinence: Secondary | ICD-10-CM | POA: Diagnosis not present

## 2018-09-02 DIAGNOSIS — R001 Bradycardia, unspecified: Secondary | ICD-10-CM | POA: Diagnosis not present

## 2018-09-02 DIAGNOSIS — M170 Bilateral primary osteoarthritis of knee: Secondary | ICD-10-CM | POA: Diagnosis not present

## 2018-09-02 DIAGNOSIS — H9193 Unspecified hearing loss, bilateral: Secondary | ICD-10-CM | POA: Diagnosis not present

## 2018-09-02 DIAGNOSIS — R42 Dizziness and giddiness: Secondary | ICD-10-CM | POA: Diagnosis not present

## 2018-09-02 DIAGNOSIS — K219 Gastro-esophageal reflux disease without esophagitis: Secondary | ICD-10-CM | POA: Diagnosis not present

## 2018-09-02 DIAGNOSIS — H3530 Unspecified macular degeneration: Secondary | ICD-10-CM | POA: Diagnosis not present

## 2018-09-02 DIAGNOSIS — E039 Hypothyroidism, unspecified: Secondary | ICD-10-CM | POA: Diagnosis not present

## 2018-09-02 DIAGNOSIS — N189 Chronic kidney disease, unspecified: Secondary | ICD-10-CM | POA: Diagnosis not present

## 2018-09-02 DIAGNOSIS — K5790 Diverticulosis of intestine, part unspecified, without perforation or abscess without bleeding: Secondary | ICD-10-CM | POA: Diagnosis not present

## 2018-09-02 DIAGNOSIS — R251 Tremor, unspecified: Secondary | ICD-10-CM | POA: Diagnosis not present

## 2018-09-02 DIAGNOSIS — N289 Disorder of kidney and ureter, unspecified: Secondary | ICD-10-CM | POA: Diagnosis not present

## 2018-09-02 DIAGNOSIS — I480 Paroxysmal atrial fibrillation: Secondary | ICD-10-CM | POA: Diagnosis not present

## 2018-09-02 DIAGNOSIS — J45909 Unspecified asthma, uncomplicated: Secondary | ICD-10-CM | POA: Diagnosis not present

## 2018-09-02 DIAGNOSIS — I130 Hypertensive heart and chronic kidney disease with heart failure and stage 1 through stage 4 chronic kidney disease, or unspecified chronic kidney disease: Secondary | ICD-10-CM | POA: Diagnosis not present

## 2018-09-02 DIAGNOSIS — Z950 Presence of cardiac pacemaker: Secondary | ICD-10-CM | POA: Diagnosis not present

## 2018-09-02 DIAGNOSIS — D649 Anemia, unspecified: Secondary | ICD-10-CM | POA: Diagnosis not present

## 2018-09-02 DIAGNOSIS — I5031 Acute diastolic (congestive) heart failure: Secondary | ICD-10-CM | POA: Diagnosis not present

## 2018-09-03 DIAGNOSIS — R001 Bradycardia, unspecified: Secondary | ICD-10-CM | POA: Diagnosis not present

## 2018-09-03 DIAGNOSIS — I5031 Acute diastolic (congestive) heart failure: Secondary | ICD-10-CM | POA: Diagnosis not present

## 2018-09-03 DIAGNOSIS — N189 Chronic kidney disease, unspecified: Secondary | ICD-10-CM | POA: Diagnosis not present

## 2018-09-03 DIAGNOSIS — I480 Paroxysmal atrial fibrillation: Secondary | ICD-10-CM | POA: Diagnosis not present

## 2018-09-03 DIAGNOSIS — I130 Hypertensive heart and chronic kidney disease with heart failure and stage 1 through stage 4 chronic kidney disease, or unspecified chronic kidney disease: Secondary | ICD-10-CM | POA: Diagnosis not present

## 2018-09-03 DIAGNOSIS — R42 Dizziness and giddiness: Secondary | ICD-10-CM | POA: Diagnosis not present

## 2018-09-09 DIAGNOSIS — I480 Paroxysmal atrial fibrillation: Secondary | ICD-10-CM | POA: Diagnosis not present

## 2018-09-09 DIAGNOSIS — R001 Bradycardia, unspecified: Secondary | ICD-10-CM | POA: Diagnosis not present

## 2018-09-09 DIAGNOSIS — I5031 Acute diastolic (congestive) heart failure: Secondary | ICD-10-CM | POA: Diagnosis not present

## 2018-09-09 DIAGNOSIS — R42 Dizziness and giddiness: Secondary | ICD-10-CM | POA: Diagnosis not present

## 2018-09-09 DIAGNOSIS — N189 Chronic kidney disease, unspecified: Secondary | ICD-10-CM | POA: Diagnosis not present

## 2018-09-09 DIAGNOSIS — I130 Hypertensive heart and chronic kidney disease with heart failure and stage 1 through stage 4 chronic kidney disease, or unspecified chronic kidney disease: Secondary | ICD-10-CM | POA: Diagnosis not present

## 2018-09-10 DIAGNOSIS — I5031 Acute diastolic (congestive) heart failure: Secondary | ICD-10-CM | POA: Diagnosis not present

## 2018-09-10 DIAGNOSIS — R42 Dizziness and giddiness: Secondary | ICD-10-CM | POA: Diagnosis not present

## 2018-09-10 DIAGNOSIS — I480 Paroxysmal atrial fibrillation: Secondary | ICD-10-CM | POA: Diagnosis not present

## 2018-09-10 DIAGNOSIS — I130 Hypertensive heart and chronic kidney disease with heart failure and stage 1 through stage 4 chronic kidney disease, or unspecified chronic kidney disease: Secondary | ICD-10-CM | POA: Diagnosis not present

## 2018-09-10 DIAGNOSIS — R001 Bradycardia, unspecified: Secondary | ICD-10-CM | POA: Diagnosis not present

## 2018-09-10 DIAGNOSIS — N189 Chronic kidney disease, unspecified: Secondary | ICD-10-CM | POA: Diagnosis not present

## 2018-09-13 DIAGNOSIS — R42 Dizziness and giddiness: Secondary | ICD-10-CM | POA: Diagnosis not present

## 2018-09-13 DIAGNOSIS — I480 Paroxysmal atrial fibrillation: Secondary | ICD-10-CM | POA: Diagnosis not present

## 2018-09-13 DIAGNOSIS — I5031 Acute diastolic (congestive) heart failure: Secondary | ICD-10-CM | POA: Diagnosis not present

## 2018-09-13 DIAGNOSIS — I130 Hypertensive heart and chronic kidney disease with heart failure and stage 1 through stage 4 chronic kidney disease, or unspecified chronic kidney disease: Secondary | ICD-10-CM | POA: Diagnosis not present

## 2018-09-13 DIAGNOSIS — N189 Chronic kidney disease, unspecified: Secondary | ICD-10-CM | POA: Diagnosis not present

## 2018-09-13 DIAGNOSIS — R001 Bradycardia, unspecified: Secondary | ICD-10-CM | POA: Diagnosis not present

## 2018-09-15 DIAGNOSIS — R42 Dizziness and giddiness: Secondary | ICD-10-CM | POA: Diagnosis not present

## 2018-09-15 DIAGNOSIS — I5031 Acute diastolic (congestive) heart failure: Secondary | ICD-10-CM | POA: Diagnosis not present

## 2018-09-15 DIAGNOSIS — I480 Paroxysmal atrial fibrillation: Secondary | ICD-10-CM | POA: Diagnosis not present

## 2018-09-15 DIAGNOSIS — I130 Hypertensive heart and chronic kidney disease with heart failure and stage 1 through stage 4 chronic kidney disease, or unspecified chronic kidney disease: Secondary | ICD-10-CM | POA: Diagnosis not present

## 2018-09-15 DIAGNOSIS — N189 Chronic kidney disease, unspecified: Secondary | ICD-10-CM | POA: Diagnosis not present

## 2018-09-15 DIAGNOSIS — R001 Bradycardia, unspecified: Secondary | ICD-10-CM | POA: Diagnosis not present

## 2018-09-16 DIAGNOSIS — N189 Chronic kidney disease, unspecified: Secondary | ICD-10-CM | POA: Diagnosis not present

## 2018-09-16 DIAGNOSIS — I5031 Acute diastolic (congestive) heart failure: Secondary | ICD-10-CM | POA: Diagnosis not present

## 2018-09-16 DIAGNOSIS — I130 Hypertensive heart and chronic kidney disease with heart failure and stage 1 through stage 4 chronic kidney disease, or unspecified chronic kidney disease: Secondary | ICD-10-CM | POA: Diagnosis not present

## 2018-09-16 DIAGNOSIS — R42 Dizziness and giddiness: Secondary | ICD-10-CM | POA: Diagnosis not present

## 2018-09-16 DIAGNOSIS — R001 Bradycardia, unspecified: Secondary | ICD-10-CM | POA: Diagnosis not present

## 2018-09-16 DIAGNOSIS — I480 Paroxysmal atrial fibrillation: Secondary | ICD-10-CM | POA: Diagnosis not present

## 2018-09-17 DIAGNOSIS — R42 Dizziness and giddiness: Secondary | ICD-10-CM | POA: Diagnosis not present

## 2018-09-17 DIAGNOSIS — I480 Paroxysmal atrial fibrillation: Secondary | ICD-10-CM | POA: Diagnosis not present

## 2018-09-17 DIAGNOSIS — N189 Chronic kidney disease, unspecified: Secondary | ICD-10-CM | POA: Diagnosis not present

## 2018-09-17 DIAGNOSIS — I5031 Acute diastolic (congestive) heart failure: Secondary | ICD-10-CM | POA: Diagnosis not present

## 2018-09-17 DIAGNOSIS — I130 Hypertensive heart and chronic kidney disease with heart failure and stage 1 through stage 4 chronic kidney disease, or unspecified chronic kidney disease: Secondary | ICD-10-CM | POA: Diagnosis not present

## 2018-09-17 DIAGNOSIS — R001 Bradycardia, unspecified: Secondary | ICD-10-CM | POA: Diagnosis not present

## 2018-09-18 DIAGNOSIS — I5031 Acute diastolic (congestive) heart failure: Secondary | ICD-10-CM | POA: Diagnosis not present

## 2018-09-18 DIAGNOSIS — R001 Bradycardia, unspecified: Secondary | ICD-10-CM | POA: Diagnosis not present

## 2018-09-18 DIAGNOSIS — I480 Paroxysmal atrial fibrillation: Secondary | ICD-10-CM | POA: Diagnosis not present

## 2018-09-18 DIAGNOSIS — N189 Chronic kidney disease, unspecified: Secondary | ICD-10-CM | POA: Diagnosis not present

## 2018-09-18 DIAGNOSIS — I130 Hypertensive heart and chronic kidney disease with heart failure and stage 1 through stage 4 chronic kidney disease, or unspecified chronic kidney disease: Secondary | ICD-10-CM | POA: Diagnosis not present

## 2018-09-18 DIAGNOSIS — R42 Dizziness and giddiness: Secondary | ICD-10-CM | POA: Diagnosis not present

## 2018-09-20 DIAGNOSIS — I130 Hypertensive heart and chronic kidney disease with heart failure and stage 1 through stage 4 chronic kidney disease, or unspecified chronic kidney disease: Secondary | ICD-10-CM | POA: Diagnosis not present

## 2018-09-20 DIAGNOSIS — R001 Bradycardia, unspecified: Secondary | ICD-10-CM | POA: Diagnosis not present

## 2018-09-20 DIAGNOSIS — N189 Chronic kidney disease, unspecified: Secondary | ICD-10-CM | POA: Diagnosis not present

## 2018-09-20 DIAGNOSIS — I480 Paroxysmal atrial fibrillation: Secondary | ICD-10-CM | POA: Diagnosis not present

## 2018-09-20 DIAGNOSIS — I5031 Acute diastolic (congestive) heart failure: Secondary | ICD-10-CM | POA: Diagnosis not present

## 2018-09-20 DIAGNOSIS — R42 Dizziness and giddiness: Secondary | ICD-10-CM | POA: Diagnosis not present

## 2018-10-02 DEATH — deceased

## 2019-10-01 NOTE — Telephone Encounter (Signed)
Pt is deceased.
